# Patient Record
Sex: Female | Born: 1945 | Race: White | Hispanic: No | Marital: Married | Smoking: Never smoker
Health system: Southern US, Community
[De-identification: ages and names within clinical notes are randomized; demographics above are authoritative.]

## PROBLEM LIST (undated history)

## (undated) DIAGNOSIS — E785 Hyperlipidemia, unspecified: Secondary | ICD-10-CM

## (undated) DIAGNOSIS — M1990 Unspecified osteoarthritis, unspecified site: Secondary | ICD-10-CM

## (undated) DIAGNOSIS — R51 Headache: Secondary | ICD-10-CM

## (undated) DIAGNOSIS — I10 Essential (primary) hypertension: Secondary | ICD-10-CM

## (undated) DIAGNOSIS — R519 Headache, unspecified: Secondary | ICD-10-CM

## (undated) HISTORY — PX: NO PAST SURGERIES: SHX2092

## (undated) HISTORY — PX: TUBAL LIGATION: SHX77

---

## 2005-09-03 ENCOUNTER — Ambulatory Visit: Payer: Self-pay | Admitting: Family Medicine

## 2007-02-09 ENCOUNTER — Emergency Department: Payer: Self-pay | Admitting: Emergency Medicine

## 2008-08-09 ENCOUNTER — Ambulatory Visit: Payer: Self-pay | Admitting: Family Medicine

## 2009-08-14 ENCOUNTER — Ambulatory Visit: Payer: Self-pay | Admitting: Family Medicine

## 2012-03-29 ENCOUNTER — Ambulatory Visit: Payer: Self-pay | Admitting: Family Medicine

## 2013-08-01 ENCOUNTER — Ambulatory Visit: Payer: Self-pay | Admitting: Family Medicine

## 2014-12-24 DIAGNOSIS — M810 Age-related osteoporosis without current pathological fracture: Secondary | ICD-10-CM | POA: Insufficient documentation

## 2015-07-03 ENCOUNTER — Other Ambulatory Visit: Payer: Self-pay | Admitting: Family Medicine

## 2015-07-03 DIAGNOSIS — Z1239 Encounter for other screening for malignant neoplasm of breast: Secondary | ICD-10-CM

## 2015-07-10 ENCOUNTER — Ambulatory Visit: Payer: Self-pay

## 2015-07-25 ENCOUNTER — Other Ambulatory Visit: Payer: Self-pay | Admitting: Family Medicine

## 2015-07-25 ENCOUNTER — Ambulatory Visit
Admission: RE | Admit: 2015-07-25 | Discharge: 2015-07-25 | Disposition: A | Discharge status: Home / Self Care | Payer: Medicare PPO | Source: Ambulatory Visit | Attending: Family Medicine | Admitting: Family Medicine

## 2015-07-25 DIAGNOSIS — Z1239 Encounter for other screening for malignant neoplasm of breast: Secondary | ICD-10-CM

## 2015-07-25 DIAGNOSIS — Z1231 Encounter for screening mammogram for malignant neoplasm of breast: Secondary | ICD-10-CM | POA: Diagnosis not present

## 2015-09-25 ENCOUNTER — Encounter: Payer: Self-pay | Admitting: *Deleted

## 2015-09-26 ENCOUNTER — Ambulatory Visit: Payer: Medicare PPO | Admitting: Anesthesiology

## 2015-09-26 ENCOUNTER — Encounter
Admission: RE | Disposition: A | Discharge status: Home / Self Care | Payer: Self-pay | Source: Ambulatory Visit | Attending: Gastroenterology

## 2015-09-26 ENCOUNTER — Encounter: Payer: Self-pay | Admitting: *Deleted

## 2015-09-26 ENCOUNTER — Ambulatory Visit
Admission: RE | Admit: 2015-09-26 | Discharge: 2015-09-26 | Disposition: A | Discharge status: Home / Self Care | Payer: Medicare PPO | Source: Ambulatory Visit | Attending: Gastroenterology | Admitting: Gastroenterology

## 2015-09-26 DIAGNOSIS — I10 Essential (primary) hypertension: Secondary | ICD-10-CM | POA: Diagnosis not present

## 2015-09-26 DIAGNOSIS — Z79899 Other long term (current) drug therapy: Secondary | ICD-10-CM | POA: Insufficient documentation

## 2015-09-26 DIAGNOSIS — Z1211 Encounter for screening for malignant neoplasm of colon: Secondary | ICD-10-CM | POA: Insufficient documentation

## 2015-09-26 DIAGNOSIS — R51 Headache: Secondary | ICD-10-CM | POA: Diagnosis not present

## 2015-09-26 DIAGNOSIS — E785 Hyperlipidemia, unspecified: Secondary | ICD-10-CM | POA: Insufficient documentation

## 2015-09-26 DIAGNOSIS — M1990 Unspecified osteoarthritis, unspecified site: Secondary | ICD-10-CM | POA: Diagnosis not present

## 2015-09-26 DIAGNOSIS — Z7982 Long term (current) use of aspirin: Secondary | ICD-10-CM | POA: Insufficient documentation

## 2015-09-26 HISTORY — DX: Headache, unspecified: R51.9

## 2015-09-26 HISTORY — DX: Unspecified osteoarthritis, unspecified site: M19.90

## 2015-09-26 HISTORY — PX: COLONOSCOPY WITH PROPOFOL: SHX5780

## 2015-09-26 HISTORY — DX: Hyperlipidemia, unspecified: E78.5

## 2015-09-26 HISTORY — DX: Headache: R51

## 2015-09-26 HISTORY — DX: Essential (primary) hypertension: I10

## 2015-09-26 SURGERY — COLONOSCOPY WITH PROPOFOL
Anesthesia: General

## 2015-09-26 MED ORDER — MIDAZOLAM HCL 5 MG/5ML IJ SOLN
INTRAMUSCULAR | Status: DC | PRN
  Administered 2015-09-26: 1 mg via INTRAVENOUS

## 2015-09-26 MED ORDER — SODIUM CHLORIDE 0.9 % IV SOLN: INTRAVENOUS | Status: DC

## 2015-09-26 MED ORDER — PROPOFOL 500 MG/50ML IV EMUL
INTRAVENOUS | Status: DC | PRN
  Administered 2015-09-26: 100 ug/kg/min via INTRAVENOUS

## 2015-09-26 MED ORDER — PHENYLEPHRINE HCL 10 MG/ML IJ SOLN
INTRAMUSCULAR | Status: DC | PRN
  Administered 2015-09-26: 150 ug via INTRAVENOUS

## 2015-09-26 MED ORDER — EPHEDRINE SULFATE 50 MG/ML IJ SOLN
INTRAMUSCULAR | Status: DC | PRN
  Administered 2015-09-26: 7.5 mg via INTRAVENOUS

## 2015-09-26 MED ORDER — FENTANYL CITRATE (PF) 100 MCG/2ML IJ SOLN
INTRAMUSCULAR | Status: DC | PRN
  Administered 2015-09-26: 50 ug via INTRAVENOUS

## 2015-09-26 MED ORDER — FENTANYL CITRATE (PF) 100 MCG/2ML IJ SOLN: 25.0000 ug | INTRAMUSCULAR | Status: DC | PRN

## 2015-09-26 MED ORDER — LIDOCAINE HCL (CARDIAC) 20 MG/ML IV SOLN
INTRAVENOUS | Status: DC | PRN
  Administered 2015-09-26: 60 mg via INTRAVENOUS

## 2015-09-26 MED ORDER — PROPOFOL 10 MG/ML IV BOLUS
INTRAVENOUS | Status: DC | PRN
  Administered 2015-09-26: 30 mg via INTRAVENOUS

## 2015-09-26 MED ORDER — SODIUM CHLORIDE 0.9 % IV SOLN
INTRAVENOUS | Status: DC
  Administered 2015-09-26: 1000 mL via INTRAVENOUS

## 2015-09-26 MED ORDER — ONDANSETRON HCL 4 MG/2ML IJ SOLN: 4.0000 mg | Freq: Once | INTRAMUSCULAR | Status: DC | PRN

## 2015-09-26 NOTE — Transfer of Care (Signed)
Immediate Anesthesia Transfer of Care Note  Patient: Victoria [name]  Procedure(s) Performed: Procedure(s): COLONOSCOPY WITH PROPOFOL (N/A)  Patient Location: PACU  Anesthesia Type:General  Level of Consciousness: awake, alert , oriented and patient cooperative  Airway & Oxygen Therapy: Patient Spontanous Breathing and Patient connected to nasal cannula oxygen  Post-op Assessment: Report given to RN and Post -op Vital signs reviewed and stable  Post vital signs: Reviewed and stable  Last Vitals:  Filed Vitals:   09/26/15 0911  BP: 126/70  Pulse: 71  Temp: 36 C  Resp: 13    Complications: No apparent anesthesia complications

## 2015-09-26 NOTE — Anesthesia Postprocedure Evaluation (Signed)
  Anesthesia Post-op Note  Patient: Victoria [name]  Procedure(s) Performed: Procedure(s): COLONOSCOPY WITH PROPOFOL (N/A)  Anesthesia type:General  Patient location: PACU  Post pain: Pain level controlled  Post assessment: Post-op Vital signs reviewed, Patient's Cardiovascular Status Stable, Respiratory Function Stable, Patent Airway and No signs of Nausea or vomiting  Post vital signs: Reviewed and stable  Last Vitals:  Filed Vitals:   09/26/15 0933  BP: 107/70  Pulse: 64  Temp:   Resp: 17    Level of consciousness: awake, alert  and patient cooperative  Complications: No apparent anesthesia complications

## 2015-09-26 NOTE — Anesthesia Preprocedure Evaluation (Signed)
Anesthesia Evaluation  Patient identified by MRN, date of birth, ID band Patient awake    Reviewed: Allergy & Precautions, NPO status , Patient's Chart, lab work & pertinent test results  Airway Mallampati: II  TM Distance: >3 FB Neck ROM: Full    Dental  (+) Partial Upper   Pulmonary neg pulmonary ROS,    Pulmonary exam normal breath sounds clear to auscultation       Cardiovascular hypertension, Pt. on medications Normal cardiovascular exam     Neuro/Psych  Headaches, negative psych ROS   GI/Hepatic negative GI ROS, Neg liver ROS,   Endo/Other  negative endocrine ROS  Renal/GU negative Renal ROS  negative genitourinary   Musculoskeletal  (+) Arthritis , Osteoarthritis,    Abdominal Normal abdominal exam  (+)   Peds negative pediatric ROS (+)  Hematology negative hematology ROS (+)   Anesthesia Other Findings   Reproductive/Obstetrics                             Anesthesia Physical Anesthesia Plan  ASA: II  Anesthesia Plan: General   Post-op Pain Management:    Induction: Intravenous  Airway Management Planned: Nasal Cannula  Additional Equipment:   Intra-op Plan:   Post-operative Plan:   Informed Consent: I have reviewed the patients History and Physical, chart, labs and discussed the procedure including the risks, benefits and alternatives for the proposed anesthesia with the patient or authorized representative who has indicated his/her understanding and acceptance.   Dental advisory given  Plan Discussed with: CRNA and Surgeon  Anesthesia Plan Comments:         Anesthesia Quick Evaluation

## 2015-09-26 NOTE — Op Note (Signed)
[facility] Gastroenterology Patient Name: Victoria [name] Procedure Date: 09/26/2015 8:31 AM MRN: [id number] Account #: [id number] Date of Birth: 1946-04-11 Admit Type: Outpatient Age: 69 Room: [facility] ENDO ROOM 4 Gender: Female Note Status: Finalized Procedure:         Colonoscopy Indications:       Screening for colorectal malignant neoplasm Providers:         Ezzard Standing. Bluford Kaufmann, MD Referring MD:      Teena Irani. Terance Hart, MD (Referring MD) Medicines:         Monitored Anesthesia Care Complications:     No immediate complications. Procedure:         Pre-Anesthesia Assessment:                    - Prior to the procedure, a History and Physical was                     performed, and patient medications, allergies and                     sensitivities were reviewed. The patient's tolerance of                     previous anesthesia was reviewed.                    - The risks and benefits of the procedure and the sedation                     options and risks were discussed with the patient. All                     questions were answered and informed consent was obtained.                    - After reviewing the risks and benefits, the patient was                     deemed in satisfactory condition to undergo the procedure.                    After obtaining informed consent, the colonoscope was                     passed under direct vision. Throughout the procedure, the                     patient's blood pressure, pulse, and oxygen saturations                     were monitored continuously. The Olympus CF-Q160AL                     colonoscope (S#. [id number]) was introduced through the anus                     and advanced to the the cecum, identified by appendiceal                     orifice and ileocecal valve. The colonoscopy was performed                     without difficulty. The patient tolerated the procedure  well. The quality of the bowel  preparation was fair. Findings:      The colon (entire examined portion) appeared normal. Impression:        - The entire examined colon is normal.                    - No specimens collected. Recommendation:    - Discharge patient to home.                    - The findings and recommendations were discussed with the                     patient. Procedure Code(s): --- Professional ---                    [phone] [id number], Colonoscopy, flexible; diagnostic, including                     collection of specimen(s) by brushing or washing, when                     performed (separate procedure) Diagnosis Code(s): --- Professional ---                    Z12.11, Encounter for screening for malignant neoplasm of                     colon CPT copyright 2014 American Medical Association. All rights reserved. The codes documented in this report are preliminary and upon coder review may  be revised to meet current compliance requirements. Wallace CullensPaul Y Dajuan Turnley, MD 09/26/2015 8:59:17 AM This report has been signed electronically. Number of Addenda: 0 Note Initiated On: 09/26/2015 8:31 AM Scope Withdrawal Time: 0 hours 7 minutes 28 seconds  Total Procedure Duration: 0 hours 16 minutes 53 seconds       [facility]

## 2015-09-26 NOTE — H&P (Signed)
    Primary Care Physician:  Dorothey BasemanAVID BRONSTEIN, MD Primary Gastroenterologist:  Dr. Bluford Kaufmannh  Pre-Procedure History & Physical: HPI:  Victoria LikesJudy L Benkert is a 69 y.o. female is here for an colonoscopy.   Past Medical History  Diagnosis Date  . Hypertension   . Hyperlipidemia   . Arthritis   . Headache     Past Surgical History  Procedure Laterality Date  . No past surgeries    . Tubal ligation      Prior to Admission medications   Medication Sig Start Date End Date Taking? Authorizing Provider  aspirin 325 MG tablet Take 325 mg by mouth daily.   Yes Historical Provider, MD  b complex vitamins capsule Take 1 capsule by mouth daily.   Yes Historical Provider, MD  valsartan-hydrochlorothiazide (DIOVAN-HCT) 320-25 MG tablet Take 1 tablet by mouth daily.   Yes Historical Provider, MD    Allergies as of 07/30/2015  . (Not on File)    History reviewed. No pertinent family history.  Social History   Social History  . Marital Status: Married    Spouse Name: N/A  . Number of Children: N/A  . Years of Education: N/A   Occupational History  . Not on file.   Social History Main Topics  . Smoking status: Never Smoker   . Smokeless tobacco: Not on file  . Alcohol Use: No  . Drug Use: No  . Sexual Activity: Not on file   Other Topics Concern  . Not on file   Social History Narrative    Review of Systems: See HPI, otherwise negative ROS  Physical Exam: There were no vitals taken for this visit. General:   Alert,  pleasant and cooperative in NAD Head:  Normocephalic and atraumatic. Neck:  Supple; no masses or thyromegaly. Lungs:  Clear throughout to auscultation.    Heart:  Regular rate and rhythm. Abdomen:  Soft, nontender and nondistended. Normal bowel sounds, without guarding, and without rebound.   Neurologic:  Alert and  oriented x4;  grossly normal neurologically.  Impression/Plan: Victoria [name] is here for an colonoscopy to be performed for screening. Risks,  benefits, limitations, and alternatives regarding  colonoscopy have been reviewed with the patient.  Questions have been answered.  All parties agreeable.   Joann Kulpa, Ezzard StandingPAUL Y, MD  09/26/2015, 7:59 AM

## 2015-09-30 ENCOUNTER — Encounter: Payer: Self-pay | Admitting: Gastroenterology

## 2017-07-07 ENCOUNTER — Other Ambulatory Visit: Payer: Self-pay | Admitting: Family Medicine

## 2017-07-07 DIAGNOSIS — Z1239 Encounter for other screening for malignant neoplasm of breast: Secondary | ICD-10-CM

## 2017-07-28 ENCOUNTER — Ambulatory Visit
Admission: RE | Admit: 2017-07-28 | Discharge: 2017-07-28 | Disposition: A | Discharge status: Home / Self Care | Payer: Medicare Other | Source: Ambulatory Visit | Attending: Family Medicine | Admitting: Family Medicine

## 2017-07-28 DIAGNOSIS — Z1231 Encounter for screening mammogram for malignant neoplasm of breast: Secondary | ICD-10-CM | POA: Insufficient documentation

## 2017-07-28 DIAGNOSIS — Z1239 Encounter for other screening for malignant neoplasm of breast: Secondary | ICD-10-CM

## 2019-01-09 ENCOUNTER — Encounter: Payer: Self-pay | Admitting: *Deleted

## 2019-01-09 ENCOUNTER — Other Ambulatory Visit: Payer: Self-pay

## 2019-01-09 ENCOUNTER — Emergency Department
Admission: EM | Admit: 2019-01-09 | Discharge: 2019-01-09 | Disposition: A | Discharge status: Home / Self Care | Payer: Medicare Other | Attending: Emergency Medicine | Admitting: Emergency Medicine

## 2019-01-09 ENCOUNTER — Emergency Department: Payer: Medicare Other

## 2019-01-09 DIAGNOSIS — Y939 Activity, unspecified: Secondary | ICD-10-CM | POA: Diagnosis not present

## 2019-01-09 DIAGNOSIS — Y929 Unspecified place or not applicable: Secondary | ICD-10-CM | POA: Insufficient documentation

## 2019-01-09 DIAGNOSIS — W231XXA Caught, crushed, jammed, or pinched between stationary objects, initial encounter: Secondary | ICD-10-CM | POA: Insufficient documentation

## 2019-01-09 DIAGNOSIS — I10 Essential (primary) hypertension: Secondary | ICD-10-CM | POA: Insufficient documentation

## 2019-01-09 DIAGNOSIS — Y999 Unspecified external cause status: Secondary | ICD-10-CM | POA: Diagnosis not present

## 2019-01-09 DIAGNOSIS — Z79899 Other long term (current) drug therapy: Secondary | ICD-10-CM | POA: Diagnosis not present

## 2019-01-09 DIAGNOSIS — S61317A Laceration without foreign body of left little finger with damage to nail, initial encounter: Secondary | ICD-10-CM | POA: Insufficient documentation

## 2019-01-09 DIAGNOSIS — Z7982 Long term (current) use of aspirin: Secondary | ICD-10-CM | POA: Insufficient documentation

## 2019-01-09 DIAGNOSIS — S6992XA Unspecified injury of left wrist, hand and finger(s), initial encounter: Secondary | ICD-10-CM | POA: Diagnosis present

## 2019-01-09 MED ORDER — CEPHALEXIN 500 MG PO CAPS: 500.0000 mg | ORAL_CAPSULE | Freq: Three times a day (TID) | ORAL | 0 refills | Status: AC

## 2019-01-09 MED ORDER — SULFAMETHOXAZOLE-TRIMETHOPRIM 800-160 MG PO TABS: 1.0000 | ORAL_TABLET | Freq: Two times a day (BID) | ORAL | 0 refills | Status: AC

## 2019-01-09 NOTE — ED Provider Notes (Signed)
[facility] Emergency Department Provider Note  ____________________________________________  Time seen: Approximately 9:13 PM  I have reviewed the triage vital signs and the nursing notes.   HISTORY  Chief Complaint Laceration    HPI Victoria [name] is a 73 y.o. female presents to the emergency department with an avulsion type laceration at the dorsal aspect of the left fifth digit from DIP joint to volar aspect of left fifth fingertip.  Patient reported that she had a piece of skin that was hanging from laceration that she trimmed away along with fingernail.  Patient reports that fingernail was dangling from laceration.  Injury was sustained while splitting wood at approximately 6:00 pm tonight.  Patient reports that she caught her finger between 2 pieces of wood.  No numbness or tingling in the left hand.  Patient reports that her tetanus status is up-to-date. No other alleviating measures have been attempted.    Past Medical History:  Diagnosis Date  . Arthritis   . Headache   . Hyperlipidemia   . Hypertension     There are no active problems to display for this patient.   Past Surgical History:  Procedure Laterality Date  . COLONOSCOPY WITH PROPOFOL N/A 09/26/2015   Procedure: COLONOSCOPY WITH PROPOFOL;  Surgeon: Wallace Cullens, MD;  Location: [facility] ENDOSCOPY;  Service: Gastroenterology;  Laterality: N/A;  . NO PAST SURGERIES    . TUBAL LIGATION      Prior to Admission medications   Medication Sig Start Date End Date Taking? Authorizing Provider  aspirin 325 MG tablet Take 325 mg by mouth daily.    [provider]  b complex vitamins capsule Take 1 capsule by mouth daily.    [provider]  cephALEXin (KEFLEX) 500 MG capsule Take 1 capsule (500 mg total) by mouth 3 (three) times daily for 7 days. 01/09/19 01/16/19  Orvil Feil, PA-C  sulfamethoxazole-trimethoprim (BACTRIM DS,SEPTRA DS) 800-160 MG tablet Take 1 tablet by mouth 2 (two)  times daily for 7 days. 01/09/19 01/16/19  Orvil Feil, PA-C  valsartan-hydrochlorothiazide (DIOVAN-HCT) 320-25 MG tablet Take 1 tablet by mouth daily.    [provider]    Allergies Patient has no known allergies.  History reviewed. No pertinent family history.  Social History Social History   Tobacco Use  . Smoking status: Never Smoker  . Smokeless tobacco: Never Used  Substance Use Topics  . Alcohol use: No  . Drug use: No     Review of Systems  Constitutional: No fever/chills Eyes: No visual changes. No discharge ENT: No upper respiratory complaints. Cardiovascular: no chest pain. Respiratory: no cough. No SOB. Gastrointestinal: No abdominal pain.  No nausea, no vomiting.  No diarrhea.  No constipation. Genitourinary: Negative for dysuria. No hematuria Musculoskeletal: Negative for musculoskeletal pain. Skin: Patient has avulsion type laceration of left fifth digit.  Neurological: Negative for headaches, focal weakness or numbness.   ____________________________________________   PHYSICAL EXAM:  VITAL SIGNS: ED Triage Vitals  Enc Vitals Group     BP 01/09/19 2021 (!) 145/88     Pulse Rate 01/09/19 2021 83     Resp 01/09/19 2021 18     Temp 01/09/19 2021 98 F (36.7 C)     Temp Source 01/09/19 2021 Oral     SpO2 01/09/19 2021 99 %     Weight --      Height --      Head Circumference --      Peak Flow --  Pain Score 01/09/19 2022 2     Pain Loc --      Pain Edu? --      Excl. in GC? --      Constitutional: Alert and oriented. Well appearing and in no acute distress. Eyes: Conjunctivae are normal. PERRL. EOMI. Head: Atraumatic. Cardiovascular: Normal rate, regular rhythm. Normal S1 and S2.  Good peripheral circulation. Respiratory: Normal respiratory effort without tachypnea or retractions. Lungs CTAB. Good air entry to the bases with no decreased or absent breath sounds. Musculoskeletal: Full range of motion to all extremities. No  gross deformities appreciated.  No flexor or extensor tendon deficits to the left fifth digit. Neurologic:  Normal speech and language. No gross focal neurologic deficits are appreciated.  Skin: Patient has avulsion type laceration from the dorsal aspect of the left fifth digit from the DIP joint to the volar aspect of the left fifth fingertip.  Fingernail was displaced.  Psychiatric: Mood and affect are normal. Speech and behavior are normal. Patient exhibits appropriate insight and judgement.   ____________________________________________   LABS (all labs ordered are listed, but only abnormal results are displayed)  Labs Reviewed - No data to display ____________________________________________  EKG   ____________________________________________  RADIOLOGY I personally viewed and evaluated these images as part of my medical decision making, as well as reviewing the written report by the radiologist.  Dg Finger Little Right  Result Date: 01/09/2019 CLINICAL DATA:  Laceration, avulsion at the tip of the right little finger EXAM: RIGHT LITTLE FINGER 2+V COMPARISON:  None. FINDINGS: Soft tissue defect noted at the tip of the right little finger. No underlying acute bony abnormality. No fracture, subluxation or dislocation. Joint space narrowing in the DIP joint. IMPRESSION: No acute bony abnormality. Electronically Signed   By: Charlett Nose M.D.   On: 01/09/2019 20:53    ____________________________________________    PROCEDURES  Procedure(s) performed:    Procedures  Left third digit laceration was irrigated with 500 cc of normal saline and Betadine.  Surgicel was applied over wound with overlying Xeroform gauze.  A layer of 4 x 4's was wrapped with Coban for compression.  Patient underwent serial bleeding checks of 15 minutes.  Medications - No data to display   ____________________________________________   INITIAL IMPRESSION / ASSESSMENT AND PLAN / ED  COURSE  Pertinent labs & imaging results that were available during my care of the patient were reviewed by me and considered in my medical decision making (see chart for details).  Review of the [place] CSRS was performed in accordance of the [facility] prior to dispensing any controlled drugs.      Assessment and Plan:  Left fifth digit laceration:  Patient presents to the emergency department with an avulsion type laceration of the left fifth digit.  Laceration was conducive to repair due to lack of viable skin.  Wound was irrigated in the emergency department with Betadine and normal saline and hemostasis was achieved using Surgicel.  Patient was observed in the emergency department for a hour to ensure that wound stopped bleeding.  Patient education regarding wound care was given.  Patient was advised to follow-up with both Dr. Stephenie Acres and wound care.  She was treated with Bactrim and Keflex. Catha Gosselin, NP was asked to examine injury and she agrees with plan of care.  Return precautions were given to return with new or worsening symptoms.  All patient questions were answered.   ____________________________________________  FINAL CLINICAL IMPRESSION(S) / ED DIAGNOSES  Final  diagnoses:  Laceration of left little finger without foreign body with damage to nail, initial encounter      NEW MEDICATIONS STARTED DURING THIS VISIT:  ED Discharge Orders         Ordered    cephALEXin (KEFLEX) 500 MG capsule  3 times daily     01/09/19 2251    sulfamethoxazole-trimethoprim (BACTRIM DS,SEPTRA DS) 800-160 MG tablet  2 times daily     01/09/19 2251              This chart was dictated using voice recognition software/Dragon. Despite best efforts to proofread, errors can occur which can change the meaning. Any change was purely unintentional.    Orvil Feil, PA-C 01/09/19 2328    Minna Antis, MD 01/10/19 0005

## 2019-01-09 NOTE — ED Triage Notes (Addendum)
Pt present w/ avulsion to R 5th digit. Pt presents w/ compression dressing from urgent care. Pt states the bone is exposed. Bleeding controlled at this time. Pt seen today at urgent care and was sent here. Pt had degloving incident at home where her finger was caught between two pieces of woods.

## 2019-01-11 ENCOUNTER — Encounter: Payer: Medicare Other | Attending: Internal Medicine | Admitting: Internal Medicine

## 2019-01-11 DIAGNOSIS — E785 Hyperlipidemia, unspecified: Secondary | ICD-10-CM | POA: Insufficient documentation

## 2019-01-11 DIAGNOSIS — S61312D Laceration without foreign body of right middle finger with damage to nail, subsequent encounter: Secondary | ICD-10-CM | POA: Insufficient documentation

## 2019-01-11 DIAGNOSIS — L98498 Non-pressure chronic ulcer of skin of other sites with other specified severity: Secondary | ICD-10-CM | POA: Diagnosis not present

## 2019-01-11 DIAGNOSIS — W3189XD Contact with other specified machinery, subsequent encounter: Secondary | ICD-10-CM | POA: Diagnosis not present

## 2019-01-11 DIAGNOSIS — I110 Hypertensive heart disease with heart failure: Secondary | ICD-10-CM | POA: Insufficient documentation

## 2019-01-11 DIAGNOSIS — S61411D Laceration without foreign body of right hand, subsequent encounter: Secondary | ICD-10-CM | POA: Diagnosis present

## 2019-01-13 NOTE — Progress Notes (Signed)
Barga, Lillyen L. ([id number]) Visit Report for 01/11/2019 Abuse/Suicide Risk Screen Details Patient Name: Hatler, Evaleigh L. Date of Service: 01/11/2019 8:45 AM Medical Record Number: [id number] Patient Account Number: [id number] Date of Birth/Sex: 10/20/1946 (73 y.o. F) Treating RN: Arnette Norris Primary Care Abby Tucholski: Dorothey Baseman Other Clinician: Referring Cheveyo [place]: Terance Hart, DAVID Treating Anuradha Chabot/Extender: Altamese [place] in Treatment: 0 Abuse/Suicide Risk Screen Items Answer ABUSE/SUICIDE RISK SCREEN: Has anyone close to you tried to hurt or harm you recentlyo No Do you feel uncomfortable with anyone in your familyo No Has anyone forced you do things that you didnot want to doo No Do you have any thoughts of harming yourselfo No Patient displays signs or symptoms of abuse and/or neglect. No Electronic Signature(s) Signed: 01/12/2019 7:54:32 AM By: Arnette Norris Entered By: Arnette Norris on 01/11/2019 09:13:33 Worrall, Keenya L. ([id number]) -------------------------------------------------------------------------------- Activities of Daily Living Details Patient Name: Frakes, Eusebia L. Date of Service: 01/11/2019 8:45 AM Medical Record Number: [id number] Patient Account Number: [id number] Date of Birth/Sex: 1946/09/20 (73 y.o. F) Treating RN: Arnette Norris Primary Care Roslin Norwood: Dorothey Baseman Other Clinician: Referring Keyon Liller: Terance Hart, DAVID Treating Zaheer Wageman/Extender: Altamese [place] in Treatment: 0 Activities of Daily Living Items Answer Activities of Daily Living (Please select one for each item) Drive Automobile Completely Able Take Medications Completely Able Use Telephone Completely Able Care for Appearance Completely Able Use Toilet Completely Able Bath / Shower Completely Able Dress Self Completely Able Feed Self Completely Able Walk Completely Able Get In / Out Bed Completely Able Housework Completely Able Prepare Meals Completely  Able Handle Money Completely Able Shop for Self Completely Able Electronic Signature(s) Signed: 01/12/2019 7:54:32 AM By: Arnette Norris Entered By: Arnette Norris on 01/11/2019 09:13:44 Rodak, Quentina L. ([id number]) -------------------------------------------------------------------------------- Education Assessment Details Patient Name: Ralls, Kaly L. Date of Service: 01/11/2019 8:45 AM Medical Record Number: [id number] Patient Account Number: [id number] Date of Birth/Sex: 1946-05-08 (73 y.o. F) Treating RN: Arnette Norris Primary Care Elner Seifert: Dorothey Baseman Other Clinician: Referring Jessenya Berdan: Terance Hart DAVID Treating Selia Wareing/Extender: Altamese  in Treatment: 0 Primary Learner Assessed: Patient Learning Preferences/Education Level/Primary Language Learning Preference: Explanation Highest Education Level: College or Above Preferred Language: English Cognitive Barrier Assessment/Beliefs Language Barrier: No Translator Needed: No Memory Deficit: No Emotional Barrier: No Cultural/Religious Beliefs Affecting Medical Care: No Physical Barrier Assessment Impaired Vision: No Impaired Hearing: No Decreased Hand dexterity: No Knowledge/Comprehension Assessment Knowledge Level: High Comprehension Level: High Ability to understand written High instructions: Ability to understand verbal High instructions: Motivation Assessment Anxiety Level: Calm Cooperation: Cooperative Education Importance: Acknowledges Need Interest in Health Problems: Asks Questions Perception: Coherent Willingness to Engage in Self- High Management Activities: Readiness to Engage in Self- High Management Activities: Electronic Signature(s) Signed: 01/12/2019 7:54:32 AM By: Arnette Norris Entered By: Arnette Norris on 01/11/2019 09:14:07 Loflin, Myeisha L. ([id number]) -------------------------------------------------------------------------------- Fall Risk Assessment  Details Patient Name: Boughner, Eadie L. Date of Service: 01/11/2019 8:45 AM Medical Record Number: [id number] Patient Account Number: [id number] Date of Birth/Sex: [birth year] (73 y.o. F) Treating RN: Arnette Norris Primary Care Carey Johndrow: Dorothey Baseman Other Clinician: Referring Hydeia Mcatee: Terance Hart, DAVID Treating Jayln Madeira/Extender: Altamese  in Treatment: 0 Fall Risk Assessment Items Have you had 2 or more falls in the last 12 monthso 0 No Have you had any fall that resulted in injury in the last 12 monthso 0 No FALL RISK ASSESSMENT: History of falling - immediate or within 3 months 0 No Secondary diagnosis 0 No Ambulatory aid None/bed rest/wheelchair/nurse 0 Yes Crutches/cane/walker 0 No Furniture 0  No IV Access/Saline Lock 0 No Gait/Training Normal/bed rest/immobile 0 Yes Weak 0 No Impaired 0 No Mental Status Oriented to own ability 0 Yes Electronic Signature(s) Signed: 01/12/2019 7:54:32 AM By: Arnette Norris Entered By: Arnette Norris on 01/11/2019 09:14:27 Losurdo, Boni L. ([id number]) -------------------------------------------------------------------------------- Foot Assessment Details Patient Name: Capuano, [place] L. Date of Service: 01/11/2019 8:45 AM Medical Record Number: [id number] Patient Account Number: [id number] Date of Birth/Sex: 1945-12-03 (73 y.o. F) Treating RN: Arnette Norris Primary Care Zoi Devine: Dorothey Baseman Other Clinician: Referring Kemaya Dorner: Terance Hart, DAVID Treating Laranda Burkemper/Extender: Altamese [place] in Treatment: 0 Foot Assessment Items Site Locations + = Sensation present, - = Sensation absent, C = Callus, U = Ulcer R = Redness, W = Warmth, M = Maceration, PU = Pre-ulcerative lesion F = Fissure, S = Swelling, D = Dryness Assessment Right: Left: Other Deformity: No No Prior Foot Ulcer: No No Prior Amputation: No No Charcot Joint: No No Ambulatory Status: Gait: Electronic Signature(s) Signed: 01/12/2019 7:54:32  AM By: Arnette Norris Entered By: Arnette Norris on 01/11/2019 09:15:16 Abbey, Viney L. ([id number]) -------------------------------------------------------------------------------- Nutrition Risk Assessment Details Patient Name: Horine, Pressley L. Date of Service: 01/11/2019 8:45 AM Medical Record Number: [id number] Patient Account Number: [id number] Date of Birth/Sex: 1945-11-20 (73 y.o. F) Treating RN: Arnette Norris Primary Care Berdena Cisek: Dorothey Baseman Other Clinician: Referring Royer Cristobal: Terance Hart, DAVID Treating Charish Schroepfer/Extender: Altamese [place] in Treatment: 0 Height (in): 58 Weight (lbs): 113 Body Mass Index (BMI): 23.6 Nutrition Risk Assessment Items NUTRITION RISK SCREEN: I have an illness or condition that made me change the kind and/or amount of 0 No food I eat I eat fewer than two meals per day 0 No I eat few fruits and vegetables, or milk products 0 No I have three or more drinks of beer, liquor or wine almost every day 0 No I have tooth or mouth problems that make it hard for me to eat 0 No I don't always have enough money to buy the food I need 0 No I eat alone most of the time 0 No I take three or more different prescribed or over-the-counter drugs a day 0 No Without wanting to, I have lost or gained 10 pounds in the last six months 0 No I am not always physically able to shop, cook and/or feed myself 0 No Nutrition Protocols Good Risk Protocol Moderate Risk Protocol Electronic Signature(s) Signed: 01/12/2019 7:54:32 AM By: Arnette Norris Entered By: Arnette Norris on 01/11/2019 09:14:58

## 2019-01-13 NOTE — Progress Notes (Signed)
Westling, Neha L. ([id number]) Visit Report for 01/11/2019 HPI Details Patient Name: Victoria [name], [name] Date of Service: 01/11/2019 8:45 AM Medical Record Number: [id number] Patient Account Number: [id number] Date of Birth/Sex: 1946-06-14 (73 y.o. 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 Her hand was gloved. She had an avulsion injury from the mid right middle phalanx all the way up and including her nailbed to the tip of her finger. There was tissue hanging off she actually remove this. She presented to the ER on 01/09/2019 after this happened. An x-ray was negative. There was no tissue for suturing. She had Bactrim and Keflex provided. The wound was dressed with surgie gel, Xeroform and Coban. She presents here for follow-up. Past medical history includes hyperlipidemia and hypertension Electronic Signature(s) Signed: 01/11/2019 4:42:30 PM By: Baltazar Najjar MD Entered By: Baltazar Najjar on 01/11/2019 10:08:59 Bonsell, Meredith L. ([id number]) -------------------------------------------------------------------------------- Physical Exam Details Patient Name: [name], [name] Date of Service: 01/11/2019 8:45 AM Medical Record Number: [id number] Patient Account Number: [id number] Date of Birth/Sex: [birth year] (73 y.o. F) Treating RN: Huel Coventry Primary Care Provider: Dorothey Baseman Other Clinician: Referring Provider: Terance Hart, DAVID Treating Provider/Extender: Maxwell Caul Weeks in Treatment: 0 Constitutional Sitting or standing Blood Pressure is within target range for patient.. Pulse regular and within target range  for patient.Marland Kitchen Respirations regular, non-labored and within target range.. Temperature is normal and within the target range for the patient.Marland Kitchen appears in no distress. Eyes Conjunctivae clear. No discharge. Neck Neck supple and symmetrical. No masses or crepitus. Respiratory Respiratory effort is easy and symmetric bilaterally. Rate is normal at rest and on room air.. Musculoskeletal She has good range of motion at the DIP of the right fifth finger. Not full because of the swelling in the wound but certainly has good tendon function. I see no involvement of the PIP joint itself. Neurological The patient has normal sensation in the right fifth finger including the tip of the finger.Marland Kitchen Psychiatric No evidence of depression, anxiety, or agitation. Calm, cooperative, and communicative. Appropriate interactions and affect.. Notes Wound exam; the patient has a superficial wound from the mid part of the middle phalanx all the way to the tip of the right fifth finger. This remove the nail bed. Most of the rest of this is superficial there is no bone or other exposed structures. No evidence of infection. Electronic Signature(s) Signed: 01/11/2019 4:42:30 PM By: Baltazar Najjar MD Entered By: Baltazar Najjar on 01/11/2019 10:11:15 Sylve, Kahlyn L. ([id number]) -------------------------------------------------------------------------------- Physician Orders Details Patient Name: [name], [name] Date of Service: 01/11/2019 8:45 AM Medical Record Number: [id number] Patient Account Number: [id number] Date of Birth/Sex: [birth year] (73 y.o. F) Treating RN: Huel Coventry Primary Care Provider: Dorothey Baseman Other Clinician: Referring Provider: Terance Hart, DAVID Treating Provider/Extender: Altamese  in Treatment: 0 Verbal / Phone Orders: No Diagnosis Coding Wound Cleansing Wound #1 Right Hand - 5th Digit o Clean wound with Normal Saline. o Cleanse wound with mild soap and  water Anesthetic (add to Medication List) Wound #1 Right Hand - 5th Digit o Topical Lidocaine 4% cream applied to wound bed prior to debridement (In Clinic Only). Primary Wound Dressing Wound #1 Right Hand - 5th Digit   o Other: - triple antibiotic Secondary Dressing Wound #1 Right Hand - 5th Digit o [place] o Conform/Kerlix Dressing Change Frequency Wound #1 Right Hand - 5th Digit o Change dressing every day. Follow-up Appointments Wound #1 Right Hand - 5th Digit o Return Appointment in 1 week. Electronic Signature(s) Signed: 01/11/2019 4:42:30 PM By: Baltazar Najjar MD Signed: 01/11/2019 5:55:39 PM By: Elliot Gurney, BSN, RN, CWS, Kim RN, BSN Entered By: Elliot Gurney, BSN, RN, CWS, Kim on 01/11/2019 09:29:13 Mcginness, Nianna L. ([id number]) -------------------------------------------------------------------------------- Problem List Details Patient Name: [name], [name] Date of Service: 01/11/2019 8:45 AM Medical Record Number: [id number] Patient Account Number: [id number] Date of Birth/Sex: [birth year] (73 y.o. 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 ([id number]) -------------------------------------------------------------------------------- Progress Note Details Patient Name: [name], [name] Date of Service: 01/11/2019 8:45 AM Medical Record Number:  [id number] Patient Account Number: [id number] Date of Birth/Sex: August 21, 1946 (73 y.o. 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 Her hand was gloved. She had an avulsion injury from the mid right middle phalanx all the way up and including her nailbed to the tip of her finger. There was tissue hanging off she actually remove this. She presented to the ER on 01/09/2019 after this happened. An x-ray was negative. There was no tissue for suturing. She had Bactrim and Keflex provided. The wound was dressed with surgie gel, Xeroform and Coban. She presents here for follow-up. Past medical history includes hyperlipidemia and hypertension Wound History Patient presents with 1 open wound that has been present for approximately 2 days. Patient has been treating wound in the following manner: pressure dressing, gauze, xeroform. Laboratory tests have not been performed in the last month. Patient reportedly has not tested positive for an antibiotic resistant organism. Patient reportedly has not tested positive for osteomyelitis. Patient reportedly has not had testing performed to evaluate circulation in the legs. Patient History Information obtained from Patient. Allergies No Known Drug Allergies Family History Cancer - Mother, Heart Disease - Father,Siblings, Hypertension - Siblings, Stroke - Father, No family history of Diabetes, Kidney Disease, Lung Disease, Seizures, Thyroid Problems, Tuberculosis. Social History Never smoker, Marital Status - Married, Alcohol Use - Never, Drug Use - No History, Caffeine Use - Daily -  coffee. Medical History Eyes Denies history of Cataracts, Glaucoma, Optic Neuritis Ear/Nose/Mouth/Throat Denies history of Chronic sinus problems/congestion, Middle ear problems Hematologic/Lymphatic Denies history of Anemia, Hemophilia, Human Immunodeficiency Virus, Lymphedema, Sickle Cell Disease Respiratory Denies history of Aspiration, Asthma, Chronic Obstructive Pulmonary Disease (COPD), Pneumothorax, Sleep Apnea, Tuberculosis Cardiovascular Patient has history of Hypertension Denies history of Angina, Arrhythmia, Congestive Heart Failure, Coronary Artery Disease, Deep Vein Thrombosis, Hypotension, Myocardial Infarction, Peripheral Arterial Disease, Peripheral Venous Disease, Phlebitis, Vasculitis Nhan, Kory L. ([id number]) Gastrointestinal  Denies history of Cirrhosis , Colitis, Crohn s, Hepatitis A, Hepatitis B, Hepatitis C Endocrine Denies history of Type I Diabetes, Type II Diabetes Genitourinary Denies history of End Stage Renal Disease Immunological Denies history of Lupus Erythematosus, Raynaud s, Scleroderma Integumentary (Skin) Denies history of History of Burn, History of pressure wounds Musculoskeletal Denies history of Gout, Rheumatoid Arthritis, Osteoarthritis, Osteomyelitis Neurologic Denies history of Dementia, Neuropathy, Quadriplegia, Paraplegia, Seizure Disorder Oncologic Denies history of Received Chemotherapy, Received Radiation Psychiatric Denies history of Anorexia/bulimia, Confinement Anxiety Medical And Surgical History Notes Musculoskeletal Arthritis Review of Systems (ROS) Constitutional Symptoms (General Health) The patient has no complaints or symptoms. Eyes Complains or has symptoms of Glasses / Contacts - distance. Ear/Nose/Mouth/Throat The patient has no complaints or symptoms. Hematologic/Lymphatic The patient has no complaints or symptoms. Respiratory The patient has no complaints or symptoms. Cardiovascular Denies complaints or  symptoms of Chest pain, LE edema, Hyperlipidemia Gastrointestinal The patient has no complaints or symptoms. Endocrine The patient has no complaints or symptoms. Genitourinary The patient has no complaints or symptoms. Immunological The patient has no complaints or symptoms. 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 Denies complaints or symptoms of Bleeding or bruising tendency, Breakdown, Swelling. Musculoskeletal The patient has no complaints or symptoms. Neurologic The patient has no complaints or symptoms. Oncologic The patient has no complaints or symptoms. Psychiatric The patient has no complaints or symptoms. Combes, Catriona L. ([id number]) Objective Constitutional Sitting or standing Blood Pressure is within target range for patient.. Pulse regular and within target range for patient.Marland Kitchen Respirations regular, non-labored and within target range.. Temperature is normal and within the target range for the patient.Marland Kitchen appears in no distress. Vitals Time Taken: 9:00 AM, Height: 58 in, Source: Stated, Weight: 113 lbs, Source: Stated, BMI: 23.6, Temperature: 98.2 F, Pulse: 84 bpm, Respiratory Rate: 18 breaths/min, Blood Pressure: 126/55 mmHg. Eyes Conjunctivae clear. No discharge. Neck Neck supple and symmetrical. No masses or crepitus. Respiratory Respiratory effort is easy and symmetric bilaterally. Rate is normal at rest and on room air.. Musculoskeletal She has good range of motion at the DIP of the right fifth finger. Not full because of the swelling in the wound but certainly has good tendon function. I see no involvement of the PIP joint itself. Neurological The patient has normal sensation in the right fifth finger including the tip of the finger.Marland Kitchen Psychiatric No evidence of depression, anxiety, or agitation. Calm, cooperative, and communicative. Appropriate interactions and affect.. General Notes: Wound exam; the patient has a  superficial wound from the mid part of the middle phalanx all the way to the tip of the right fifth finger. This remove the nail bed. Most of the rest of this is superficial there is no bone or other exposed structures. No evidence of infection. Integumentary (Hair, Skin) Wound #1 status is Open. Original cause of wound was Trauma. The wound is located on the Right Hand - 5th Digit. The wound measures 3.5cm length x 1.5cm width x 0.1cm depth; 4.123cm^2 area and 0.412cm^3 volume. There is Fat Layer (Subcutaneous Tissue) Exposed exposed. There is no tunneling or undermining noted. There is a medium amount of drainage noted. The wound margin is thickened. There is no granulation within the wound bed. There is a small (1-33%) amount of necrotic tissue within the wound bed including Eschar. The periwound skin appearance exhibited: Maceration. The periwound skin appearance did not exhibit: Callus, Crepitus, Excoriation, Induration, Rash, Scarring, Dry/Scaly, Atrophie Blanche, Cyanosis, Ecchymosis, Hemosiderin Staining, Mottled, Pallor, Rubor,  Erythema. Assessment Active Problems ICD-10 Laceration without foreign body of right hand, subsequent encounter Non-pressure chronic ulcer of skin of other sites with other specified severity Spink, Makhayla L. ([id number]) Plan Wound Cleansing: Wound #1 Right Hand - 5th Digit: Clean wound with Normal Saline. Cleanse wound with mild soap and water Anesthetic (add to Medication List): Wound #1 Right Hand - 5th Digit: Topical Lidocaine 4% cream applied to wound bed prior to debridement (In Clinic Only). Primary Wound Dressing: Wound #1 Right Hand - 5th Digit: Other: - triple antibiotic Secondary Dressing: Wound #1 Right Hand - 5th Digit: [place] Conform/Kerlix Dressing Change Frequency: Wound #1 Right Hand - 5th Digit: Change dressing every day. Follow-up Appointments: Wound #1 Right Hand - 5th Digit: Return Appointment in 1 week. 1. After some  deliberation I elected simply to apply topical antibiotics to this area, Telfa, Kerlix and conform 2. The skin around this looked healthy. There was some maceration I do not think Xeroform is necessary. 3. Had some thoughts about silver alginate but will see how this looks next week. 4. She had an x-ray done in the ER that was negative. 5. As far as I can tell her range of motion/tendon and nerve function in the finger are normal. I do not think she requires a hand surgeon appointment at this point Electronic Signature(s) Signed: 01/11/2019 4:42:30 PM By: Baltazar Najjar MD Entered By: Baltazar Najjar on 01/11/2019 10:12:52 Barse, Sravya L. ([id number]) -------------------------------------------------------------------------------- ROS/PFSH Details Patient Name: Krasner, Habiba L. Date of Service: 01/11/2019 8:45 AM Medical Record Number: [id number] Patient Account Number: [id number] Date of Birth/Sex: [birth year] (73 y.o. F) Treating RN: Arnette Norris Primary Care Provider: Dorothey Baseman Other Clinician: Referring Provider: Terance Hart, DAVID Treating Provider/Extender: Altamese [place] in Treatment: 0 Information Obtained From Patient Wound History Do you currently have one or more open woundso Yes How many open wounds do you currently haveo 1 Approximately how long have you had your woundso 2 days How have you been treating your wound(s) until nowo pressure dressing, gauze, xeroform Has your wound(s) ever healed and then re-openedo No Have you had any lab work done in the past montho No Have you tested positive for an antibiotic resistant organism (MRSA, VRE)o No Have you tested positive for osteomyelitis (bone infection)o No Have you had any tests for circulation on your legso No Eyes Complaints and Symptoms: Positive for: Glasses / Contacts - distance Medical History: Negative for: Cataracts; Glaucoma; Optic Neuritis Cardiovascular Complaints and Symptoms: Negative  for: Chest pain; LE edema Review of System Notes: Hyperlipidemia Medical History: Positive for: Hypertension Negative for: Angina; Arrhythmia; Congestive Heart Failure; Coronary Artery Disease; Deep Vein Thrombosis; Hypotension; Myocardial Infarction; Peripheral Arterial Disease; Peripheral Venous Disease; Phlebitis; Vasculitis Integumentary (Skin) Complaints and Symptoms: 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 ([id number]) Complaints and Symptoms: No Complaints or Symptoms Medical History: Negative for: Chronic sinus problems/congestion; Middle ear problems Hematologic/Lymphatic Complaints and Symptoms: No Complaints or Symptoms Medical History: Negative for: Anemia; Hemophilia; Human Immunodeficiency Virus; Lymphedema; Sickle Cell Disease Respiratory Complaints and Symptoms: No Complaints or Symptoms Medical History: Negative for: Aspiration; Asthma; Chronic Obstructive Pulmonary Disease (COPD); Pneumothorax; Sleep Apnea; Tuberculosis Gastrointestinal Complaints and Symptoms: No Complaints or Symptoms Medical History: Negative for: Cirrhosis ; Colitis; Crohnos; Hepatitis A; Hepatitis B; Hepatitis C Endocrine Complaints and  Symptoms: No Complaints or Symptoms Medical History: Negative for: Type I Diabetes; Type II Diabetes Genitourinary Complaints and Symptoms: No Complaints or Symptoms Medical History: Negative for: End Stage Renal Disease Immunological Complaints and Symptoms: No Complaints or Symptoms Medical History: Negative for: Lupus Erythematosus; Raynaudos; Scleroderma Musculoskeletal Complaints and Symptoms: No Complaints or Symptoms Regino, Alan L. ([id number]) Medical  History: Negative for: Gout; Rheumatoid Arthritis; Osteoarthritis; Osteomyelitis Past Medical History Notes: Arthritis Neurologic Complaints and Symptoms: No Complaints or Symptoms Medical History: Negative for: Dementia; Neuropathy; Quadriplegia; Paraplegia; Seizure Disorder Oncologic Complaints and Symptoms: No Complaints or Symptoms Medical History: Negative for: Received Chemotherapy; Received Radiation Psychiatric Complaints and Symptoms: No Complaints or Symptoms Medical History: Negative for: Anorexia/bulimia; Confinement Anxiety Immunizations Pneumococcal Vaccine: Received Pneumococcal Vaccination: No Implantable Devices None Family and Social History Cancer: Yes - Mother; Diabetes: No; Heart Disease: Yes - Father,Siblings; Hypertension: Yes - Siblings; Kidney Disease: No; Lung Disease: No; Seizures: No; Stroke: Yes - Father; Thyroid Problems: No; Tuberculosis: No; Never smoker; Marital Status - Married; Alcohol Use: Never; Drug Use: No History; Caffeine Use: Daily - coffee; Financial Concerns: No; Food, Clothing or Shelter Needs: No; Support System Lacking: No; Transportation Concerns: No; Advanced Directives: No; Living Will: Yes; Medical Power of Attorney: No Electronic Signature(s) Signed: 01/11/2019 4:42:30 PM By: Baltazar Najjarobson, Benzion Mesta MD Signed: 01/12/2019 7:54:32 AM By: Arnette NorrisBiell, Kristina Entered By: Arnette NorrisBiell, Kristina on 01/11/2019 09:13:16 [place]assa, Shantelle L. ([id number]) -------------------------------------------------------------------------------- SuperBill Details Patient Name: Trentman, Elyce L. Date of Service: 01/11/2019 Medical Record Number: [id number] Patient Account Number: [id number] Date of Birth/Sex: 03/24/1946 [age] y.o. F) Treating RN: Huel CoventryWoody, Kim Primary Care Provider: Dorothey BasemanBRONSTEIN, DAVID Other Clinician: Referring Provider: Terance HartBRONSTEIN, DAVID Treating Provider/Extender: Altamese CarolinaOBSON, Jayceion Lisenby G Weeks in Treatment: 0 Diagnosis Coding ICD-10 Codes Code  Description [phone] Laceration without foreign body of right hand, subsequent encounter L98.498 Non-pressure chronic ulcer of skin of other sites with other specified severity Facility Procedures CPT4 Code: [id number] Description: 99213 - WOUND CARE VISIT-LEV 3 EST PT Modifier: Quantity: 1 Physician Procedures CPT4 Code Description: [id number] - WC PHYS LEVEL 2 - NEW PT ICD-10 Diagnosis Description S61.411D Laceration without foreign body of right hand, subsequent encount L98.498 Non-pressure chronic ulcer of skin of other sites with other spec Modifier: er ified severity Quantity: 1 Electronic Signature(s) Signed: 01/11/2019 4:42:30 PM By: Baltazar Najjarobson, Kellyn Mccary MD Entered By: Baltazar Najjarobson, Alyse Kathan on 01/11/2019 10:13:21
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
## 2019-01-18 ENCOUNTER — Encounter: Payer: Medicare Other | Attending: Internal Medicine | Admitting: Internal Medicine

## 2019-01-18 DIAGNOSIS — L98498 Non-pressure chronic ulcer of skin of other sites with other specified severity: Secondary | ICD-10-CM | POA: Insufficient documentation

## 2019-01-18 DIAGNOSIS — E785 Hyperlipidemia, unspecified: Secondary | ICD-10-CM | POA: Insufficient documentation

## 2019-01-18 DIAGNOSIS — S61411D Laceration without foreign body of right hand, subsequent encounter: Secondary | ICD-10-CM | POA: Diagnosis not present

## 2019-01-18 DIAGNOSIS — I10 Essential (primary) hypertension: Secondary | ICD-10-CM | POA: Insufficient documentation

## 2019-01-20 NOTE — Progress Notes (Signed)
Guay, Corinda L. ([id number]) Visit Report for 01/18/2019 Debridement Details Patient Name: Victoria [name], [name] Date of Service: 01/18/2019 8:15 AM Medical Record Number: [id number] Patient Account Number: [id number] Date of Birth/Sex: Aug 19, 1946 (73 y.o. F) Treating RN: Huel Coventry Primary Care Provider: Terance Hart, DAVID Other Clinician: Referring Provider: Terance Hart, DAVID Treating Provider/Extender: Altamese [place] in Treatment: 1 Debridement Performed for Wound #1 Right Hand - 5th Digit Assessment: Performed By: Physician Maxwell Caul, MD Debridement Type: Debridement Level of Consciousness (Pre- Awake and Alert procedure): Pre-procedure Verification/Time Yes - 08:32 Out Taken: Start Time: 08:34 Pain Control: Lidocaine Total Area Debrided (L x W): 3.1 (cm) x 1.5 (cm) = 4.65 (cm) Tissue and other material Viable, Non-Viable, Eschar, Slough, Subcutaneous, Slough debrided: Level: Skin/Subcutaneous Tissue Debridement Description: Excisional Instrument: Curette Bleeding: Minimum Hemostasis Achieved: Pressure End Time: 08:37 Response to Treatment: Procedure was tolerated well Level of Consciousness Awake and Alert (Post-procedure): Post Debridement Measurements of Total Wound Length: (cm) 3.1 Width: (cm) 1.5 Depth: (cm) 0.2 Volume: (cm) 0.73 Character of Wound/Ulcer Post Debridement: Stable Post Procedure Diagnosis Same as Pre-procedure Electronic Signature(s) Signed: 01/18/2019 5:05:37 PM By: Baltazar Najjar MD Signed: 01/18/2019 5:29:19 PM By: Elliot Gurney, BSN, RN, CWS, Kim RN, BSN Entered By: Baltazar Najjar on 01/18/2019 08:44:42 Greggs, Gabriellia L. ([id number]) -------------------------------------------------------------------------------- HPI Details Patient Name: Haren, Olene L. Date of Service: 01/18/2019 8:15 AM Medical Record Number: [id number] Patient Account Number: [id number] Date of Birth/Sex: 1946/04/20 (72 y.o. 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 Her hand was gloved. She had an avulsion injury from the mid right middle phalanx all the way up and including her nailbed to the tip of her finger. There was tissue hanging off she actually remove this. She presented to the ER on 01/09/2019 after this happened. An x-ray was negative. There was no tissue for suturing. She had Bactrim and Keflex provided. The wound was dressed with surgie gel, Xeroform and Coban. She presents here for follow-up. Past medical history includes hyperlipidemia and hypertension 3/4; traumatic area on her right fifth dorsal finger. Measurements are slightly better although surface has tightly adherent debris. I did simply use topical antibiotics last week but will change her to silver alginate this week. Electronic Signature(s) Signed: 01/18/2019 5:05:37 PM By: Baltazar Najjar MD Entered By: Baltazar Najjar on 01/18/2019 08:46:12 Dattilo, Cena L. ([id number]) -------------------------------------------------------------------------------- Physical Exam Details Patient Name: Senat, Anntoinette L. Date of Service: 01/18/2019 8:15 AM Medical Record Number: [id number] Patient Account Number: [id number] Date of Birth/Sex: [birth year] (73 y.o. F) Treating RN: Huel Coventry Primary Care Provider: Dorothey Baseman Other Clinician: Referring Provider: Terance Hart, DAVID Treating Provider/Extender: Maxwell Caul Weeks in Treatment: 1 Constitutional Sitting or standing Blood Pressure is within target range for patient.. Pulse regular and within target range for patient.Marland Kitchen Respirations regular, non-labored and within  target range.. Temperature is normal and within the target range for the patient.Marland Kitchen appears in no distress. Notes Wound exam; the patient's wound is slightly smaller. However surface still has a lot of debris which is reasonably tightly adherent. There must have been some drainage here. Using a #3 curette this was removed. Hemostasis with direct pressure. She tolerated this well. There is no evidence of  infection. No swelling of the DIP or PIP joint. No surrounding cellulitis Electronic Signature(s) Signed: 01/18/2019 5:05:37 PM By: Baltazar Najjar MD Entered By: Baltazar Najjar on 01/18/2019 08:47:20 Guadalupe, Louetta L. ([id number]) -------------------------------------------------------------------------------- Physician Orders Details Patient Name: Gabor, Maygen L. Date of Service: 01/18/2019 8:15 AM Medical Record Number: [id number] Patient Account Number: [id number] Date of Birth/Sex: [birth year] (73 y.o. F) Treating RN: Huel Coventry Primary Care Provider: Dorothey Baseman Other Clinician: Referring Provider: Terance Hart, DAVID Treating Provider/Extender: Altamese Avery in Treatment: 1 Verbal / Phone Orders: No Diagnosis Coding Wound Cleansing Wound #1 Right Hand - 5th Digit o Cleanse wound with mild soap and water Anesthetic (add to Medication List) Wound #1 Right Hand - 5th Digit o Topical Lidocaine 4% cream applied to wound bed prior to debridement (In Clinic Only). o Benzocaine Topical Anesthetic Spray applied to wound bed prior to debridement (In Clinic Only). Primary Wound Dressing Wound #1 Right Hand - 5th Digit o Silver Alginate Secondary Dressing Wound #1 Right Hand - 5th Digit o Conform/Kerlix - secured with stretch net Dressing Change Frequency Wound #1 Right Hand - 5th Digit o Change dressing every day. Follow-up Appointments Wound #1 Right Hand - 5th Digit o Return Appointment in 1 week. Electronic Signature(s) Signed: 01/18/2019 5:05:37 PM By:  Baltazar Najjar MD Signed: 01/18/2019 5:29:19 PM By: Elliot Gurney, BSN, RN, CWS, Kim RN, BSN Entered By: Elliot Gurney, BSN, RN, CWS, Kim on 01/18/2019 08:37:57 Ghee, Mushka L. ([id number]) -------------------------------------------------------------------------------- Problem List Details Patient Name: Dunford, Rosia L. Date of Service: 01/18/2019 8:15 AM Medical Record Number: [id number] Patient Account Number: [id number] Date of Birth/Sex: [birth year] (73 y.o. 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 ([id number]) -------------------------------------------------------------------------------- Progress Note Details Patient Name: Isidore, Janayla L. Date of Service: 01/18/2019 8:15 AM Medical Record Number: [id number] Patient Account Number: [id number] Date of Birth/Sex: 1946-01-24 (73 y.o. 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 Her hand was gloved. She had an avulsion injury from the mid right middle phalanx all the way up and  including her nailbed to the tip of her finger. There was tissue hanging off she actually remove this. She presented to the ER on 01/09/2019 after this happened. An x-ray was negative. There was no tissue for suturing. She had Bactrim and Keflex provided. The wound was dressed with surgie gel, Xeroform and Coban. She presents here for follow-up. Past medical history includes hyperlipidemia and hypertension 3/4; traumatic area on her right fifth dorsal finger. Measurements are slightly better although surface has tightly adherent debris. I did simply use topical antibiotics last week but will change her to silver alginate this week. Objective Constitutional Sitting or standing Blood Pressure is within target range for patient.. Pulse regular and within target  range for patient.Marland Kitchen Respirations regular, non-labored and within target range.. Temperature is normal and within the target range for the patient.Marland Kitchen appears in no distress. Vitals Time Taken: 8:18 AM, Height: 58 in, Weight: 113 lbs, BMI: 23.6, Temperature: 97.5 F, Pulse: 73 bpm, Respiratory Rate: 18 breaths/min, Blood Pressure: 129/67 mmHg. General Notes: Wound exam; the patient's wound is slightly smaller. However surface still has a lot of debris which is reasonably tightly adherent. There must have been some drainage here. Using a #3 curette this was removed. Hemostasis with direct pressure. She tolerated this well. There is no evidence of infection. No swelling of the DIP or PIP joint. No surrounding cellulitis Integumentary (Hair, Skin) Wound #1 status is Open. Original cause of wound was Trauma. The wound is located on the Right Hand - 5th Digit. The wound measures 3.1cm length x 1.5cm width x 0.1cm depth; 3.652cm^2 area and 0.365cm^3 volume. There is Fat Layer (Subcutaneous Tissue) Exposed exposed. There is no tunneling or undermining noted. There is a small amount of serous drainage noted. The wound margin is thickened. There is no  granulation within the wound bed. There is a small (1-33%) amount of necrotic tissue within the wound bed including Eschar. The periwound skin appearance exhibited: Maceration. The periwound skin appearance did not exhibit: Callus, Crepitus, Excoriation, Induration, Rash, Scarring, Dry/Scaly, Atrophie Langbehn, Briseis L. ([id number]) Blanche, Cyanosis, Ecchymosis, Hemosiderin Staining, Mottled, Pallor, Rubor, Erythema. The periwound has tenderness on palpation. Assessment Active Problems ICD-10 Laceration without foreign body of right hand, subsequent encounter Non-pressure chronic ulcer of skin of other sites with other specified severity Procedures Wound #1 Pre-procedure diagnosis of Wound #1 is a Trauma, Other located on the Right Hand - 5th Digit . There was a Excisional Skin/Subcutaneous Tissue Debridement with a total area of 4.65 sq cm performed by Maxwell Caul, MD. With the following instrument(s): Curette to remove Viable and Non-Viable tissue/material. Material removed includes Eschar, Subcutaneous Tissue, and Slough after achieving pain control using Lidocaine. No specimens were taken. A time out was conducted at 08:32, prior to the start of the procedure. A Minimum amount of bleeding was controlled with Pressure. The procedure was tolerated well. Post Debridement Measurements: 3.1cm length x 1.5cm width x 0.2cm depth; 0.73cm^3 volume. Character of Wound/Ulcer Post Debridement is stable. Post procedure Diagnosis Wound #1: Same as Pre-Procedure Plan Wound Cleansing: Wound #1 Right Hand - 5th Digit: Cleanse wound with mild soap and water Anesthetic (add to Medication List): Wound #1 Right Hand - 5th Digit: Topical Lidocaine 4% cream applied to wound bed prior to debridement (In Clinic Only). Benzocaine Topical Anesthetic Spray applied to wound bed prior to debridement (In Clinic Only). Primary Wound Dressing: Wound #1 Right Hand - 5th Digit: Silver Alginate Secondary  Dressing: Wound #1 Right Hand - 5th Digit: Conform/Kerlix - secured with stretch net Dressing Change Frequency: Wound #1 Right Hand - 5th Digit: Change dressing every day. Follow-up Appointments: Wound #1 Right Hand - 5th Digit: Return Appointment in 1 week. Frayre, Adelaine L. ([id number]) 1. Debridement with a #3 curette as noted previously 2. Silver alginate hopefully this will reduce bacterial bioburden and absorb drainage. 3. To wash the wound off gently with soap and water and dress it once a day at home Electronic Signature(s) Signed: 01/18/2019 5:05:37 PM By: Baltazar Najjar MD Entered By: Baltazar Najjar on 01/18/2019 08:48:58 Quintela, Akyla L. ([id number]) -------------------------------------------------------------------------------- SuperBill Details Patient Name: Hord, Crystalmarie L. Date of Service: 01/18/2019 Medical Record Number: [id number] Patient Account Number: [id number] Date of  Birth/Sex: 04/05/1946 (74 y.o. F) Treating RN: Huel Coventry Primary Care Provider: Dorothey Baseman Other Clinician: Referring Provider: Terance Hart, DAVID Treating Provider/Extender: Altamese [place]ett in Treatment: 1 Diagnosis Coding ICD-10 Codes Code Description [phone] Laceration without foreign body of right hand, subsequent encounter L98.498 Non-pressure chronic ulcer of skin of other sites with other specified severity Facility Procedures CPT4 Code Description: [id number] 11042 - DEB SUBQ TISSUE 20 SQ CM/< ICD-10 Diagnosis Description S61.411D Laceration without foreign body of right hand, subsequent encount L98.498 Non-pressure chronic ulcer of skin of other sites with other spec Modifier: er ified severity Quantity: 1 Physician Procedures CPT4 Code Description: [id number] 11042 - WC PHYS SUBQ TISS 20 SQ CM ICD-10 Diagnosis Description S61.411D Laceration without foreign body of right hand, subsequent encount L98.498 Non-pressure chronic ulcer of skin of other sites with other spec Modifier:  er ified severity Quantity: 1 Electronic Signature(s) Signed: 01/18/2019 5:05:37 PM By: Baltazar Najjar MD Entered By: Baltazar Najjar on 01/18/2019 08:49:14
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
## 2019-01-25 ENCOUNTER — Encounter: Payer: Medicare Other | Admitting: Internal Medicine

## 2019-01-25 DIAGNOSIS — S61411D Laceration without foreign body of right hand, subsequent encounter: Secondary | ICD-10-CM | POA: Diagnosis not present

## 2019-01-26 NOTE — Progress Notes (Signed)
Say, Charlsey L. ([id number]) Visit Report for 01/25/2019 HPI Details Patient Name: Victoria [name], [name] Date of Service: 01/25/2019 8:15 AM Medical Record Number: [id number] Patient Account Number: [id number] Date of Birth/Sex: 02/01/1946 (73 y.o. 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 Her hand was gloved. She had an avulsion injury from the mid right middle phalanx all the way up and including her nailbed to the tip of her finger. There was tissue hanging off she actually remove this. She presented to the ER on 01/09/2019 after this happened. An x-ray was negative. There was no tissue for suturing. She had Bactrim and Keflex provided. The wound was dressed with surgie gel, Xeroform and Coban. She presents here for follow-up. Past medical history includes hyperlipidemia and hypertension 3/4; traumatic area on her right fifth dorsal finger. Measurements are slightly better although surface has tightly adherent debris. I did simply use topical antibiotics last week but will change her to silver alginate this week. 3/11; traumatic area on the right dorsal fifth finger involving the nailbed. We have been using silver alginate. Nice progression today. Electronic Signature(s) Signed: 01/25/2019 5:42:07 PM By: Baltazar Najjar MD Entered By: Baltazar Najjar on 01/25/2019 08:47:57 Buswell, Norinne L. ([id number]) -------------------------------------------------------------------------------- Physical Exam Details Patient Name: Victoria [name], [name] Date of Service: 01/25/2019 8:15 AM Medical Record Number: [id number] Patient Account  Number: [id number] Date of Birth/Sex: Jul 15, 1946 (73 y.o. Female) Treating RN: Huel Coventry Primary Care Provider: Dorothey Baseman Other Clinician: Referring Provider: Dorothey Baseman Treating Provider/Extender: Altamese [place] in Treatment: 2 Constitutional Patient is hypertensive.. Pulse regular and within target range for patient.Marland Kitchen Respirations regular, non-labored and within target range.. Temperature is normal and within the target range for the patient.Marland Kitchen appears in no distress. Notes Wound exam; the patient's wound is quite a bit smaller. She is epithelialized the tip of her finger and a large area of the wound from the base of the nailbed towards the DIP. There is no epithelialization yet of the nailbed itself. No evidence of surrounding infection. She has good range of motion Electronic Signature(s) Signed: 01/25/2019 5:42:07 PM By: Baltazar Najjar MD Entered By: Baltazar Najjar on 01/25/2019 08:48:54 Scalera, Karenna L. ([id number]) -------------------------------------------------------------------------------- Physician Orders Details Patient Name: Victoria [name], [name] Date of Service: 01/25/2019 8:15 AM Medical Record Number: [id number] Patient Account Number: [id number] Date of Birth/Sex: 04-11-1946 (73 y.o. Female) Treating RN: Huel Coventry Primary Care Provider: Dorothey Baseman Other Clinician: Referring Provider: Terance Hart, DAVID Treating Provider/Extender: Altamese [place] in Treatment: 2 Verbal / Phone Orders: No Diagnosis Coding Wound Cleansing Wound #1 Right Hand - 5th Digit o Cleanse wound with mild soap and water Anesthetic (add to Medication List) Wound #1 Right Hand - 5th Digit o Topical Lidocaine 4% cream applied to wound bed prior to debridement (In Clinic Only). Primary Wound Dressing Wound #1 Right Hand - 5th Digit o Silver Alginate Secondary Dressing Wound #1 Right Hand - 5th Digit o Conform/Kerlix - secured with stretch net Dressing  Change Frequency Wound #1 Right Hand - 5th Digit o Change dressing every other day. Follow-up Appointments Wound #1 Right Hand - 5th Digit o Return Appointment in 2 weeks. Electronic Signature(s) Signed: 01/25/2019 5:23:21 PM By: Elliot Gurney,  BSN, RN, CWS, Radio producer, BSN Signed: 01/25/2019 5:42:07 PM By: Baltazar Najjar MD Entered By: Elliot Gurney, BSN, RN, CWS, Kim on 01/25/2019 08:44:02 Victoria [name], [name] ([id number]) -------------------------------------------------------------------------------- Problem List Details Patient Name: Victoria [name], [name] Date of Service: 01/25/2019 8:15 AM Medical Record Number: [id number] Patient Account Number: [id number] Date of Birth/Sex: 01-Oct-1946 (73 y.o. 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 ([id number]) -------------------------------------------------------------------------------- Progress Note Details Patient Name: Victoria [name], [name] Date of Service: 01/25/2019 8:15 AM Medical Record Number: [id number] Patient Account Number: [id number] Date of Birth/Sex: 1946/10/17 (73 y.o. 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 Her hand was gloved. She had an avulsion injury from the mid right middle phalanx all the way up and including her nailbed to the tip of her finger. There was tissue hanging off she actually remove this. She presented to the ER on 01/09/2019 after this happened. An x-ray was negative. There was no tissue for suturing. She had Bactrim and Keflex provided. The wound was dressed with surgie gel, Xeroform and Coban. She presents here for follow-up. Past medical history includes hyperlipidemia and hypertension 3/4; traumatic area on her right fifth dorsal finger. Measurements are slightly better although surface has tightly adherent debris. I did simply use topical antibiotics last week but will change her to silver alginate this week. 3/11; traumatic area on the right dorsal fifth finger involving the nailbed. We have been using silver alginate. Nice progression today. Objective Constitutional Patient is hypertensive.. Pulse regular and within target range for patient.Marland Kitchen Respirations regular, non-labored and within target range.. Temperature is normal and within the target range for the patient.Marland Kitchen appears in no distress. Vitals Time Taken: 8:25 AM, Height: 58 in, Weight: 113 lbs, BMI: 23.6, Temperature: 97.8 F, Pulse: 76 bpm, Respiratory Rate: 16 breaths/min, Blood Pressure: 153/71 mmHg. General Notes: Wound exam; the patient's wound is quite a bit smaller. She is epithelialized the tip of her finger and a large area of the wound from the base of the nailbed towards the DIP. There is no epithelialization yet of the nailbed itself. No evidence of surrounding infection. She has good range of motion Integumentary (Hair, Skin) Wound #1 status is Open. Original cause of wound was Trauma. The wound is located on the Right Hand - 5th Digit. The wound  measures 2.5cm length x 1cm width x 0.1cm depth; 1.963cm^2 area and 0.196cm^3 volume. There is Fat Layer (Subcutaneous Tissue) Exposed exposed. There is no tunneling or undermining noted. There is a none present amount of drainage noted. The wound margin is thickened. There is no granulation within the wound bed. There is a small (1-33%) amount of necrotic tissue within the wound bed including Adherent Slough. The periwound skin appearance did  not exhibit: Callus, Crepitus, Excoriation, Induration, Rash, Scarring, Dry/Scaly, Maceration, Atrophie Blanche, Cyanosis, Ecchymosis, Riemann, [place] L. ([id number]) Hemosiderin Staining, Mottled, Pallor, Rubor, Erythema. The periwound has tenderness on palpation. 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 Primary Wound Dressing: Wound #1 Right Hand - 5th Digit: Silver Alginate Secondary Dressing: Wound #1 Right Hand - 5th Digit: Conform/Kerlix - secured with stretch net Dressing Change Frequency: Wound #1 Right Hand - 5th Digit: Change dressing every other day. Follow-up Appointments: Wound #1 Right Hand - 5th Digit: Return Appointment in 2 weeks. 1. I see no reason to change the primary dressing. We are using silver alginate with gauze she is changing this daily. Follow-up in 2 weeks Electronic Signature(s) Signed: 01/25/2019 5:42:07 PM By: Baltazar Najjarobson, [place] Gentle MD Entered By:  Baltazar Najjarobson, Aldwin Micalizzi on 01/25/2019 08:49:27 Victoria [name], [name] ([id number]) -------------------------------------------------------------------------------- SuperBill Details Patient Name: Victoria [name], [name] Date of Service: 01/25/2019 Medical Record Number: [id number] Patient Account Number: [id number] Date of Birth/Sex: Aug 14, 1946 [age] y.o. Female) Treating RN: Huel CoventryWoody, Kim Primary Care Provider: Dorothey BasemanBRONSTEIN, DAVID Other Clinician: Referring Provider: Terance HartBRONSTEIN, DAVID Treating Provider/Extender: Maxwell CaulOBSON, Kaydence Baba [place] in Treatment: 2 Diagnosis Coding ICD-10 Codes Code Description [phone] Laceration without foreign body of right hand, subsequent encounter L98.498 Non-pressure chronic ulcer of skin of other sites with other specified severity Facility Procedures CPT4 Code: [id number] Description: [id number] - WOUND CARE VISIT-LEV 2 EST PT Modifier: Quantity: 1 Physician Procedures CPT4 Code Description: [id number] - WC PHYS LEVEL 2 - EST PT ICD-10 Diagnosis Description S61.411D Laceration without foreign body of right hand, subsequent encount L98.498 Non-pressure chronic ulcer of skin of other sites with other spec Modifier: er ified severity Quantity: 1 Electronic Signature(s) Signed: 01/25/2019 5:42:07 PM By: Baltazar Najjarobson, Catalyna Reilly MD Entered By: Baltazar Najjarobson, Ramesses Crampton on 01/25/2019 08:49:51
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
## 2019-02-08 ENCOUNTER — Other Ambulatory Visit: Payer: Self-pay

## 2019-02-08 ENCOUNTER — Encounter: Payer: Medicare Other | Admitting: Internal Medicine

## 2019-02-08 DIAGNOSIS — S61411D Laceration without foreign body of right hand, subsequent encounter: Secondary | ICD-10-CM | POA: Diagnosis not present
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
## 2019-02-09 NOTE — Progress Notes (Signed)
[name], [name] ([id number]) Visit Report for 02/08/2019 Debridement Details Patient Name: Victoria [name], [name] Date of Service: 02/08/2019 8:15 AM Medical Record Number: [id number] Patient Account Number: [id number] Date of Birth/Sex: 02-Oct-1946 (73 y.o. F) Treating RN: Huel Coventry Primary Care Provider: Terance Hart, DAVID Other Clinician: Referring Provider: Terance Hart, DAVID Treating Provider/Extender: Altamese [place] in Treatment: 4 Debridement Performed for Wound #1 Right Hand - 5th Digit Assessment: Performed By: Physician Maxwell Caul, MD Debridement Type: Debridement Level of Consciousness (Pre- Awake and Alert procedure): Pre-procedure Verification/Time Yes - 08:45 Out Taken: Start Time: 08:46 Pain Control: Lidocaine Total Area Debrided (L x W): 2 (cm) x 0.4 (cm) = 0.8 (cm) Tissue and other material Viable, Non-Viable, Eschar, Slough, Subcutaneous, Slough debrided: Level: Skin/Subcutaneous Tissue Debridement Description: Excisional Instrument: Curette Bleeding: Minimum Hemostasis Achieved: Pressure End Time: 08:49 Response to Treatment: Procedure was tolerated well Level of Consciousness Awake and Alert (Post-procedure): Post Debridement Measurements of Total Wound Length: (cm) 2 Width: (cm) 0.7 Depth: (cm) 0.2 Volume: (cm) 0.22 Character of Wound/Ulcer Post Debridement: Stable Post Procedure Diagnosis Same as Pre-procedure Electronic Signature(s) Signed: 02/08/2019 5:18:51 PM By: Baltazar Najjar MD Signed: 02/09/2019 9:14:45 AM By: Elliot Gurney, BSN, RN, CWS, Kim RN, BSN Entered By: Baltazar Najjar on 02/08/2019 09:05:08 [name], [name] ([id number]) -------------------------------------------------------------------------------- HPI Details Patient Name: Victoria [name], [name] Date of Service: 02/08/2019 8:15 AM Medical Record Number: [id number] Patient Account Number: [id number] Date of Birth/Sex: 05-15-1946 (73 y.o. 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 Her hand was gloved. She had an avulsion injury from the mid right middle phalanx all the way up and including her nailbed to the tip of her finger. There was tissue hanging off she actually remove this. She presented to the ER on 01/09/2019 after this happened. An x-ray was negative. There was no tissue for suturing. She had Bactrim and Keflex provided. The wound was dressed with surgie gel, Xeroform and Coban. She presents here for follow-up. Past medical history includes hyperlipidemia and hypertension 3/4; traumatic area on her right fifth dorsal finger. Measurements are slightly better although surface has tightly adherent debris. I did simply use topical antibiotics last week but will change her to silver alginate this week. 3/11; traumatic area on the right dorsal fifth finger involving the nailbed. We have been using silver alginate. Nice progression today. 3/25; patient arrives with tight surface eschar over the majority of the wound. She has been using silver alginate Electronic Signature(s) Signed: 02/08/2019 5:18:51 PM By: Baltazar Najjar MD Entered By: Baltazar Najjar on 02/08/2019 09:06:31 [name], [name] ([id number]) -------------------------------------------------------------------------------- Physical Exam Details Patient Name: Victoria [name], [name] Date of Service: 02/08/2019 8:15 AM Medical Record Number: [id number] Patient Account Number: [id number] Date of Birth/Sex: [birth year] (73 y.o. F) Treating RN: Huel Coventry Primary Care Provider: Dorothey Baseman Other Clinician: Referring Provider: Terance Hart,  DAVID Treating Provider/Extender: Altamese  in Treatment: 4 Notes Wound exam; the patient arrived with a thick eschar over the majority of the wound. This was removed with a #3 curette. All of this is actually healed except for the base of the fifth digit nail bed. And even this looks healthy post debridement. There is no evidence of  infection hemostasis with direct pressure Electronic Signature(s) Signed: 02/08/2019 5:18:51 PM By: Baltazar Najjar MD Entered By: Baltazar Najjar on 02/08/2019 09:07:58 [name], [name] ([id number]) -------------------------------------------------------------------------------- Physician Orders Details Patient Name: Victoria [name], [name] Date of Service: 02/08/2019 8:15 AM Medical Record Number: [id number] Patient Account Number: [id number] Date of Birth/Sex: Jan 30, 1946 (73 y.o. F) Treating RN: Huel Coventry Primary Care Provider: Dorothey Baseman Other Clinician: Referring Provider: Terance Hart, DAVID Treating Provider/Extender: Altamese [place] in Treatment: 4 Verbal / Phone Orders: No Diagnosis Coding Wound Cleansing Wound #1 Right Hand - 5th Digit o Cleanse wound with mild soap and water Anesthetic (add to Medication List) Wound #1 Right Hand - 5th Digit o Topical Lidocaine 4% cream applied to wound bed prior to debridement (In Clinic Only). o Benzocaine Topical Anesthetic Spray applied to wound bed prior to debridement (In Clinic Only). Primary Wound Dressing Wound #1 Right Hand - 5th Digit o Silver Alginate Secondary Dressing Wound #1 Right Hand - 5th Digit o Conform/Kerlix - secured with stretch net Dressing Change Frequency Wound #1 Right Hand - 5th Digit o Change dressing every other day. Follow-up Appointments Wound #1 Right Hand - 5th Digit o Return Appointment in 2 weeks. Electronic Signature(s) Signed: 02/08/2019 5:18:51 PM By: Baltazar Najjar MD Signed: 02/09/2019 9:14:45 AM By: Elliot Gurney, BSN, RN, CWS, Kim RN,  BSN Entered By: Elliot Gurney, BSN, RN, CWS, Kim on 02/08/2019 08:47:56 [name], [name] ([id number]) -------------------------------------------------------------------------------- Problem List Details Patient Name: Victoria [name], [name] Date of Service: 02/08/2019 8:15 AM Medical Record Number: [id number] Patient Account Number: [id number] Date of Birth/Sex: 1945/11/30 (73 y.o. 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 ([id number]) -------------------------------------------------------------------------------- Progress Note Details Patient Name: Victoria [name], [name] Date of Service: 02/08/2019 8:15 AM Medical Record Number: [id number] Patient Account Number: [id number] Date of Birth/Sex: 04-12-1946 (73 y.o. 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 Her hand was gloved. She had an avulsion injury from the mid right middle phalanx all the way up and including her nailbed to the tip of her finger. There was tissue hanging off  she actually remove this. She presented to the ER on 01/09/2019 after this happened. An x-ray was negative. There was no tissue for suturing. She had Bactrim and Keflex provided. The wound was dressed with surgie gel, Xeroform and Coban. She presents here for follow-up. Past medical history includes hyperlipidemia and hypertension 3/4; traumatic area on her right fifth dorsal finger. Measurements are slightly better although surface has tightly adherent debris. I did simply use topical antibiotics last week but will change her to silver alginate this week. 3/11; traumatic area on the right dorsal fifth finger involving the nailbed. We have been using silver alginate. Nice progression today. 3/25; patient arrives  with tight surface eschar over the majority of the wound. She has been using silver alginate Objective Constitutional Vitals Time Taken: 8:17 AM, Height: 58 in, Weight: 113 lbs, BMI: 23.6, Temperature: 97.7 F, Pulse: 64 bpm, Respiratory Rate: 16 breaths/min, Blood Pressure: 111/60 mmHg. Integumentary (Hair, Skin) Wound #1 status is Open. Original cause of wound was Trauma. The wound is located on the Right Hand - 5th Digit. The wound measures 2cm length x 0.7cm width x 0.1cm depth; 1.1cm^2 area and 0.11cm^3 volume. There is Fat Layer (Subcutaneous Tissue) Exposed exposed. There is no tunneling or undermining noted. There is a none present amount of drainage noted. The wound margin is thickened. There is no granulation within the wound bed. There is a small (1-33%) amount of necrotic tissue within the wound bed including Adherent Slough. The periwound skin appearance did not exhibit: Callus, Crepitus, Excoriation, Induration, Rash, Scarring, Dry/Scaly, Maceration, Atrophie Blanche, Cyanosis, Ecchymosis, Hemosiderin Staining, Mottled, Pallor, Rubor, Erythema. The periwound has tenderness on palpation. Victoria [name], [name] ([id number]) Assessment Active Problems ICD-10 Laceration without  foreign body of right hand, subsequent encounter Non-pressure chronic ulcer of skin of other sites with other specified severity Procedures Wound #1 Pre-procedure diagnosis of Wound #1 is a Trauma, Other located on the Right Hand - 5th Digit . There was a Excisional Skin/Subcutaneous Tissue Debridement with a total area of 0.8 sq cm performed by Maxwell Caul, MD. With the following instrument(s): Curette to remove Viable and Non-Viable tissue/material. Material removed includes Eschar, Subcutaneous Tissue, and Slough after achieving pain control using Lidocaine. No specimens were taken. A time out was conducted at 08:45, prior to the start of the procedure. A Minimum amount of bleeding was controlled with Pressure. The procedure was tolerated well. Post Debridement Measurements: 2cm length x 0.7cm width x 0.2cm depth; 0.22cm^3 volume. Character of Wound/Ulcer Post Debridement is stable. Post procedure Diagnosis Wound #1: Same as Pre-Procedure Plan Wound Cleansing: Wound #1 Right Hand - 5th Digit: Cleanse wound with mild soap and water Anesthetic (add to Medication List): Wound #1 Right Hand - 5th Digit: Topical Lidocaine 4% cream applied to wound bed prior to debridement (In Clinic Only). Benzocaine Topical Anesthetic Spray applied to wound bed prior to debridement (In Clinic Only). Primary Wound Dressing: Wound #1 Right Hand - 5th Digit: Silver Alginate Secondary Dressing: Wound #1 Right Hand - 5th Digit: Conform/Kerlix - secured with stretch net Dressing Change Frequency: Wound #1 Right Hand - 5th Digit: Change dressing every other day. Follow-up Appointments: Wound #1 Right Hand - 5th Digit: Return Appointment in 2 weeks. The we can continue with silver alginate for another 2 weeks. This should be closed the next time we see her Victoria [name], [name] ([id number]) Electronic Signature(s) Signed: 02/08/2019 5:18:51 PM By: Baltazar Najjar MD Entered By: Baltazar Najjar on 02/08/2019  09:08:35 Victoria [name], [name] ([id number]) -------------------------------------------------------------------------------- SuperBill Details Patient Name: Victoria [name], [name] Date of Service: 02/08/2019 Medical Record Number: [id number] Patient Account Number: [id number] Date of Birth/Sex: 05/16/1946 (73 y.o. F) Treating RN: Huel Coventry Primary Care Provider: Dorothey Baseman Other Clinician: Referring Provider: Terance Hart, DAVID Treating Provider/Extender: Altamese [place] in Treatment: 4 Diagnosis Coding ICD-10 Codes Code Description [id number] Laceration without foreign body of right hand, subsequent encounter L98.498 Non-pressure chronic ulcer of skin of other sites with other specified severity Facility Procedures CPT4 Code Description: [id number] 11042 - DEB SUBQ TISSUE 20 SQ CM/< ICD-10 Diagnosis Description L98.498 Non-pressure chronic ulcer of skin of other sites with other spec Modifier: ified severity Quantity:  1 Physician Procedures CPT4 Code Description: [id number] 11042 - WC PHYS SUBQ TISS 20 SQ CM ICD-10 Diagnosis Description L98.498 Non-pressure chronic ulcer of skin of other sites with other spec Modifier: ified severity Quantity: 1 Electronic Signature(s) Signed: 02/08/2019 5:18:51 PM By: Baltazar Najjar MD Entered By: Baltazar Najjar on 02/08/2019 09:08:58

## 2019-02-22 ENCOUNTER — Encounter: Payer: Medicare Other | Attending: Internal Medicine | Admitting: Internal Medicine

## 2019-02-22 ENCOUNTER — Other Ambulatory Visit: Payer: Self-pay

## 2019-02-22 DIAGNOSIS — Z09 Encounter for follow-up examination after completed treatment for conditions other than malignant neoplasm: Secondary | ICD-10-CM | POA: Diagnosis not present

## 2019-02-22 DIAGNOSIS — I10 Essential (primary) hypertension: Secondary | ICD-10-CM | POA: Insufficient documentation

## 2019-02-22 DIAGNOSIS — L98498 Non-pressure chronic ulcer of skin of other sites with other specified severity: Secondary | ICD-10-CM | POA: Diagnosis present

## 2019-02-22 NOTE — Progress Notes (Signed)
Moening, Jalysa L. ([id number]) Visit Report for 02/22/2019 Arrival Information Details Patient Name: Victoria [name], [name] Date of Service: 02/22/2019 8:30 AM Medical Record Number: [id number] Patient Account Number: [id number] Date of Birth/Sex: May 11, 1946 (73 y.o. F) Treating RN: Curtis Sites Primary Care Dominie Benedick: Terance Hart, DAVID Other Clinician: Referring Yoon Barca: Terance Hart, DAVID Treating Ragan Duhon/Extender: Linwood Dibbles, HOYT Weeks in Treatment: 6 Visit Information History Since Last Visit Added or deleted any medications: No Patient Arrived: Ambulatory Any new allergies or adverse reactions: No Arrival Time: 08:41 Had a fall or experienced change in No Accompanied By: self activities of daily living that may affect Transfer Assistance: None risk of falls: Patient Identification Verified: Yes Signs or symptoms of abuse/neglect since last visito No Secondary Verification Process Completed: Yes Hospitalized since last visit: No Implantable device outside of the clinic excluding No cellular tissue based products placed in the center since last visit: Has Dressing in Place as Prescribed: Yes Pain Present Now: No Electronic Signature(s) Signed: 02/22/2019 2:47:29 PM By: Curtis Sites Entered By: Curtis Sites on 02/22/2019 08:42:56 Urey, Zhaniya L. ([id number]) -------------------------------------------------------------------------------- Clinic Level of Care Assessment Details Patient Name: [name], [name] Date of Service: 02/22/2019 8:30 AM Medical Record Number: [id number] Patient Account Number: [id number] Date of Birth/Sex: 04-17-1946 (73 y.o. F) Treating RN: Huel Coventry Primary Care Valerie Fredin: Terance Hart, DAVID Other Clinician: Referring Kellye Mizner: Terance Hart, DAVID Treating Yahya Boldman/Extender: Linwood Dibbles, HOYT Weeks in Treatment: 6 Clinic Level of Care Assessment Items TOOL 4 Quantity Score []  - Use when only an EandM is performed on FOLLOW-UP visit 0 ASSESSMENTS - Nursing  Assessment / Reassessment []  - Reassessment of Co-morbidities (includes updates in patient status) 0 X- 1 5 Reassessment of Adherence to Treatment Plan ASSESSMENTS - Wound and Skin Assessment / Reassessment X - Simple Wound Assessment / Reassessment - one wound 1 5 []  - 0 Complex Wound Assessment / Reassessment - multiple wounds []  - 0 Dermatologic / Skin Assessment (not related to wound area) ASSESSMENTS - Focused Assessment []  - Circumferential Edema Measurements - multi extremities 0 []  - 0 Nutritional Assessment / Counseling / Intervention []  - 0 Lower Extremity Assessment (monofilament, tuning fork, pulses) []  - 0 Peripheral Arterial Disease Assessment (using hand held doppler) ASSESSMENTS - Ostomy and/or Continence Assessment and Care []  - Incontinence Assessment and Management 0 []  - 0 Ostomy Care Assessment and Management (repouching, etc.) PROCESS - Coordination of Care X - Simple Patient / Family Education for ongoing care 1 15 []  - 0 Complex (extensive) Patient / Family Education for ongoing care []  - 0 Staff obtains Chiropractor, Records, Test Results / Process Orders []  - 0 Staff telephones HHA, Nursing Homes / Clarify orders / etc []  - 0 Routine Transfer to another Facility (non-emergent condition) []  - 0 Routine Hospital Admission (non-emergent condition) []  - 0 New Admissions / Manufacturing engineer / Ordering NPWT, Apligraf, etc. []  - 0 Emergency Hospital Admission (emergent condition) X- 1 10 Simple Discharge Coordination Etsitty, Nikolina L. ([id number]) []  - 0 Complex (extensive) Discharge Coordination PROCESS - Special Needs []  - Pediatric / Minor Patient Management 0 []  - 0 Isolation Patient Management []  - 0 Hearing / Language / Visual special needs []  - 0 Assessment of Community assistance (transportation, D/C planning, etc.) []  - 0 Additional assistance / Altered mentation []  - 0 Support Surface(s) Assessment (bed, cushion, seat,  etc.) INTERVENTIONS - Wound Cleansing / Measurement X - Simple Wound Cleansing - one wound 1 5 []  - 0 Complex Wound Cleansing - multiple wounds X- 1 5 Wound Imaging (photographs -  any number of wounds)  - 0 Wound Tracing (instead of photographs) X- 1 5 Simple Wound Measurement - one wound  - 0 Complex Wound Measurement - multiple wounds INTERVENTIONS - Wound Dressings  - Small Wound Dressing one or multiple wounds 0  - 0 Medium Wound Dressing one or multiple wounds  - 0 Large Wound Dressing one or multiple wounds  - 0 Application of Medications - topical  - 0 Application of Medications - injection INTERVENTIONS - Miscellaneous  - External ear exam 0  - 0 Specimen Collection (cultures, biopsies, blood, body fluids, etc.)  - 0 Specimen(s) / Culture(s) sent or taken to Lab for analysis  - 0 Patient Transfer (multiple staff / Nurse, adult / Similar devices)  - 0 Simple Staple / Suture removal (25 or less)  - 0 Complex Staple / Suture removal (26 or more)  - 0 Hypo / Hyperglycemic Management (close monitor of Blood Glucose)  - 0 Ankle / Brachial Index (ABI) - do not check if billed separately X- 1 5 Vital Signs Fung, Gayla L. ([id number]) Has the patient been seen at the hospital within the last three years: Yes Total Score: 55 Level Of Care: New/Established - Level 2 Electronic Signature(s) Signed: 02/22/2019 4:47:28 PM By: Elliot Gurney, BSN, RN, CWS, Kim RN, BSN Entered By: Elliot Gurney, BSN, RN, CWS, Kim on 02/22/2019 08:54:08 Isola, Caysie L. ([id number]) -------------------------------------------------------------------------------- Encounter Discharge Information Details Patient Name: [name], [name] Date of Service: 02/22/2019 8:30 AM Medical Record Number: [id number] Patient Account Number: [id number] Date of Birth/Sex: 26-Jun-1946 (73 y.o. F) Treating RN: Huel Coventry Primary Care Iriel Nason: Dorothey Baseman Other Clinician: Referring Lamel Mccarley:  Terance Hart DAVID Treating Meril Dray/Extender: Linwood Dibbles, HOYT Weeks in Treatment: 6 Encounter Discharge Information Items Discharge Condition: Stable Ambulatory Status: Ambulatory Discharge Destination: Home Transportation: Private Auto Accompanied By: self Schedule Follow-up Appointment: Yes Clinical Summary of Care: Electronic Signature(s) Signed: 02/22/2019 4:47:28 PM By: Elliot Gurney, BSN, RN, CWS, Kim RN, BSN Entered By: Elliot Gurney, BSN, RN, CWS, Kim on 02/22/2019 08:55:38 Kahl, Lizania L. ([id number]) -------------------------------------------------------------------------------- Lower Extremity Assessment Details Patient Name: Aja, Rosina L. Date of Service: 02/22/2019 8:30 AM Medical Record Number: [id number] Patient Account Number: [id number] Date of Birth/Sex: 06-06-1946 (73 y.o. F) Treating RN: Curtis Sites Primary Care Jeffery Bachmeier: Dorothey Baseman Other Clinician: Referring Shaquanna Lycan: Terance Hart, DAVID Treating Nyal Schachter/Extender: Linwood Dibbles, HOYT Weeks in Treatment: 6 Electronic Signature(s) Signed: 02/22/2019 2:47:29 PM By: Curtis Sites Entered By: Curtis Sites on 02/22/2019 08:47:41 Greenough, Edwin L. ([id number]) -------------------------------------------------------------------------------- Multi Wound Chart Details Patient Name: Stanard, Lashaundra L. Date of Service: 02/22/2019 8:30 AM Medical Record Number: [id number] Patient Account Number: [id number] Date of Birth/Sex: 05-03-1946 (73 y.o. F) Treating RN: Huel Coventry Primary Care Christen Wardrop: Terance Hart, DAVID Other Clinician: Referring Loveah Like: Terance Hart, DAVID Treating Daiana Vitiello/Extender: Linwood Dibbles, HOYT Weeks in Treatment: 6 Vital Signs Height(in): 58 Pulse(bpm): 77 Weight(lbs): 113 Blood Pressure(mmHg): 123/79 Body Mass Index(BMI): 24 Temperature(F): 98.0 Respiratory Rate 16 (breaths/min): Photos: [N/A:N/A] Wound Location: Right Hand - 5th Digit N/A N/A Wounding Event: Trauma N/A N/A Primary Etiology: Trauma, Other N/A  N/A Comorbid History: Hypertension N/A N/A Date Acquired: 01/09/2019 N/A N/A Weeks of Treatment: 6 N/A N/A Wound Status: Open N/A N/A Measurements L x W x D 0x0x0 N/A N/A (cm) Area (cm) : 0 N/A N/A Volume (cm) : 0 N/A N/A % Reduction in Area: 100.00% N/A N/A % Reduction in Volume: 100.00% N/A N/A Classification: Full Thickness Without N/A N/A Exposed Support Structures Exudate Amount: None Present N/A N/A Wound Margin: Thickened N/A N/A Granulation Amount:  None Present (0%) N/A N/A Necrotic Amount: None Present (0%) N/A N/A Exposed Structures: Fascia: No N/A N/A Fat Layer (Subcutaneous Tissue) Exposed: No Tendon: No Muscle: No Joint: No Bone: No Limited to Skin Breakdown Epithelialization: Large (67-100%) N/A N/A Periwound Skin Texture: N/A N/A Trego, Megumi L. ([id number]) Scarring: Yes Excoriation: No Induration: No Callus: No Crepitus: No Rash: No Periwound Skin Moisture: Maceration: No N/A N/A Dry/Scaly: No Periwound Skin Color: Atrophie Blanche: No N/A N/A Cyanosis: No Ecchymosis: No Erythema: No Hemosiderin Staining: No Mottled: No Pallor: No Rubor: No Tenderness on Palpation: No N/A N/A Treatment Notes Electronic Signature(s) Signed: 02/22/2019 3:44:34 PM By: Baltazar Najjar MD Entered By: Baltazar Najjar on 02/22/2019 08:57:24 Baysinger, Zenya L. ([id number]) -------------------------------------------------------------------------------- Multi-Disciplinary Care Plan Details Patient Name: Hollenbeck, Caffie L. Date of Service: 02/22/2019 8:30 AM Medical Record Number: [id number] Patient Account Number: [id number] Date of Birth/Sex: 06/20/1946 (73 y.o. F) Treating RN: Huel Coventry Primary Care Lonn Im: Dorothey Baseman Other Clinician: Referring Yanin Muhlestein: Dorothey Baseman Treating Chatham Howington/Extender: Skeet Simmer in Treatment: 6 Active Inactive Electronic Signature(s) Signed: 02/22/2019 4:47:28 PM By: Elliot Gurney, BSN, RN, CWS, Kim RN, BSN Entered By:  Elliot Gurney, BSN, RN, CWS, Kim on 02/22/2019 08:52:18 Eichhorst, Royetta L. ([id number]) -------------------------------------------------------------------------------- Pain Assessment Details Patient Name: Deneault, Bellina L. Date of Service: 02/22/2019 8:30 AM Medical Record Number: [id number] Patient Account Number: [id number] Date of Birth/Sex: 1946-10-27 (73 y.o. F) Treating RN: Curtis Sites Primary Care Trae Bovenzi: Dorothey Baseman Other Clinician: Referring Finnigan Warriner: Terance Hart, DAVID Treating Kierre Deines/Extender: Linwood Dibbles, HOYT Weeks in Treatment: 6 Active Problems Location of Pain Severity and Description of Pain Patient Has Paino No Site Locations Pain Management and Medication Current Pain Management: Electronic Signature(s) Signed: 02/22/2019 2:47:29 PM By: Curtis Sites Entered By: Curtis Sites on 02/22/2019 08:43:02 Schlottman, Maycie L. ([id number]) -------------------------------------------------------------------------------- Patient/Caregiver Education Details Patient Name: Hudson, Dalaysia L. Date of Service: 02/22/2019 8:30 AM Medical Record Number: [id number] Patient Account Number: [id number] Date of Birth/Gender: 27-May-1946 (73 y.o. F) Treating RN: Huel Coventry Primary Care Physician: Dorothey Baseman Other Clinician: Referring Physician: Dorothey Baseman Treating Physician/Extender: Skeet Simmer in Treatment: 6 Education Assessment Education Provided To: Patient Education Topics Provided Safety: Handouts: Safety Methods: Demonstration, Explain/Verbal Responses: State content correctly Electronic Signature(s) Signed: 02/22/2019 4:47:28 PM By: Elliot Gurney, BSN, RN, CWS, Kim RN, BSN Entered By: Elliot Gurney, BSN, RN, CWS, Kim on 02/22/2019 08:55:16 Weaver, Kymari L. ([id number]) -------------------------------------------------------------------------------- Wound Assessment Details Patient Name: Pequignot, Jacee L. Date of Service: 02/22/2019 8:30 AM Medical Record Number:  [id number] Patient Account Number: [id number] Date of Birth/Sex: [birth year] (73 y.o. F) Treating RN: Curtis Sites Primary Care Nitish Roes: Terance Hart, DAVID Other Clinician: Referring Jeree Delcid: Terance Hart, DAVID Treating Tellis Spivak/Extender: Linwood Dibbles, HOYT Weeks in Treatment: 6 Wound Status Wound Number: 1 Primary Etiology: Trauma, Other Wound Location: Right Hand - 5th Digit Wound Status: Open Wounding Event: Trauma Comorbid History: Hypertension Date Acquired: 01/09/2019 Weeks Of Treatment: 6 Clustered Wound: No Photos Wound Measurements Length: (cm) 0 % Reducti Width: (cm) 0 % Reducti Depth: (cm) 0 Epithelia Area: (cm) 0 Tunnelin Volume: (cm) 0 Undermin on in Area: 100% on in Volume: 100% lization: Large (67-100%) g: No ing: No Wound Description Full Thickness Without Exposed Support Foul Odor Classification: Structures Slough/Fi Wound Margin: Thickened Exudate None Present Amount: After Cleansing: No brino No Wound Bed Granulation Amount: None Present (0%) Exposed Structure Necrotic Amount: None Present (0%) Fascia Exposed: No Fat Layer (Subcutaneous Tissue) Exposed: No Tendon Exposed: No Muscle Exposed: No Joint Exposed: No Bone Exposed: No Limited to Skin Breakdown  Periwound Skin Texture Kopp, Minola L. ([id number]) Texture Color No Abnormalities Noted: No No Abnormalities Noted: No Callus: No Atrophie Blanche: No Crepitus: No Cyanosis: No Excoriation: No Ecchymosis: No Induration: No Erythema: No Rash: No Hemosiderin Staining: No Scarring: Yes Mottled: No Pallor: No Moisture Rubor: No No Abnormalities Noted: No Dry / Scaly: No Maceration: No Electronic Signature(s) Signed: 02/22/2019 2:47:29 PM By: Curtis Sitesorthy, Joanna Entered By: Curtis Sitesorthy, Joanna on 02/22/2019 08:47:06 Ciriello, Shavaun L. ([id number]) -------------------------------------------------------------------------------- Vitals Details Patient Name: Korn, Zeniya L. Date of Service:  02/22/2019 8:30 AM Medical Record Number: [id number] Patient Account Number: [id number] Date of Birth/Sex: 04/09/1946 15(72 y.o. F) Treating RN: Curtis Sitesorthy, Joanna Primary Care Zimal Weisensel: Terance HartBRONSTEIN, DAVID Other Clinician: Referring Keonta Alsip: Terance HartBRONSTEIN, DAVID Treating Arihant Pennings/Extender: Linwood DibblesSTONE III, HOYT Weeks in Treatment: 6 Vital Signs Time Taken: 08:43 Temperature (F): 98.0 Height (in): 58 Pulse (bpm): 77 Weight (lbs): 113 Respiratory Rate (breaths/min): 16 Body Mass Index (BMI): 23.6 Blood Pressure (mmHg): 123/79 Reference Range: 80 - 120 mg / dl Electronic Signature(s) Signed: 02/22/2019 2:47:29 PM By: Curtis Sitesorthy, Joanna Entered By: Curtis Sitesorthy, Joanna on 02/22/2019 08:44:59

## 2019-02-24 NOTE — Progress Notes (Signed)
Cipriani, Hasel L. ([id number]) Visit Report for 02/22/2019 HPI Details Patient Name: [name], [name] Date of Service: 02/22/2019 8:30 AM Medical Record Number: [id number] Patient Account Number: [id number] Date of Birth/Sex: 04-08-1946 (73 y.o. 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 Her hand was gloved. She had an avulsion injury from the mid right middle phalanx all the way up and including her nailbed to the tip of her finger. There was tissue hanging off she actually remove this. She presented to the ER on 01/09/2019 after this happened. An x-ray was negative. There was no tissue for suturing. She had Bactrim and Keflex provided. The wound was dressed with surgie gel, Xeroform and Coban. She presents here for follow-up. Past medical history includes hyperlipidemia and hypertension 3/4; traumatic area on her right fifth dorsal finger. Measurements are slightly better although surface has tightly adherent debris. I did simply use topical antibiotics last week but will change her to silver alginate this week. 3/11; traumatic area on the right dorsal fifth finger involving the nailbed. We have been using silver alginate. Nice progression today. 3/25; patient arrives with tight surface eschar over the majority of the wound. She has been using silver alginate 4/8; patient's right fifth finger is totally epithelialized including the nail bed Electronic Signature(s) Signed: 02/22/2019 3:44:34 PM By: Baltazar Najjar MD Entered By: Baltazar Najjar on 02/22/2019 08:57:52 [name], [name]  ([id number]) -------------------------------------------------------------------------------- Physical Exam Details Patient Name: [name], [name] Date of Service: 02/22/2019 8:30 AM Medical Record Number: [id number] Patient Account Number: [id number] Date of Birth/Sex: Mar 18, 1946 (73 y.o. F) Treating RN: Huel Coventry Primary Care Provider: Dorothey Baseman Other Clinician: Referring Provider: Terance Hart, DAVID Treating Provider/Extender: Altamese [place] in Treatment: 6 Constitutional Sitting or standing Blood Pressure is within target range for patient.. Pulse regular and within target range for patient.Marland Kitchen Respirations regular, non-labored and within target range.. Temperature is normal and within the target range for the patient.Marland Kitchen appears in no distress. Musculoskeletal The DIP joint is nontender. Integumentary (Hair, Skin) No erythema. Notes Wound exam; the patient arrived with the area totally healed on the right dorsal fifth finger. Even the nailbed is completely epithelialized. There is no evidence of infection some scar tissue. Electronic Signature(s) Signed: 02/22/2019 3:44:34 PM By: Baltazar Najjar MD Entered By: Baltazar Najjar on 02/22/2019 08:59:14 [name], [name] ([id number]) -------------------------------------------------------------------------------- Physician Orders Details Patient Name: [name], [name] Date of Service: 02/22/2019 8:30 AM Medical Record Number: [id number] Patient Account Number: [id number] Date of Birth/Sex: 12-12-1945 (73 y.o. F) Treating RN: Huel Coventry Primary Care Provider: Dorothey Baseman Other Clinician: Referring Provider: Terance Hart DAVID Treating Provider/Extender: Skeet [place] in Treatment: 6 Verbal / Phone Orders: No Diagnosis Coding Discharge From [facility] Services o Discharge from Wound Care Center - Treatment Complete Electronic Signature(s) Signed: 02/22/2019 4:47:28 PM By: Elliot Gurney, BSN, RN, CWS, Kim RN, BSN Signed: 02/24/2019  12:23:18 PM By: Lenda Kelp PA-C Entered By: Elliot Gurney, BSN, RN, CWS, Kim on 02/22/2019 08:53:38 [name], [name] ([id number]) -------------------------------------------------------------------------------- Problem List Details Patient Name: [name], [name] Date of Service: 02/22/2019 8:30 AM Medical Record Number: [id number] Patient Account Number: [id number] Date of Birth/Sex: 1946/09/05 (73 y.o. 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 ([id number]) -------------------------------------------------------------------------------- Progress Note Details Patient Name: [name], [name] Date of Service: 02/22/2019 8:30 AM Medical Record Number: [id number] Patient Account Number: [id number] Date of Birth/Sex: 1946-08-31 (73 y.o. 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 Her hand was gloved. She had an avulsion injury from the mid right middle phalanx all the way up and including her nailbed to the tip of her finger.  There was tissue hanging off she actually remove this. She presented to the ER on 01/09/2019 after this happened. An x-ray was negative. There was no tissue for suturing. She had Bactrim and Keflex provided. The wound was dressed with surgie gel, Xeroform and Coban. She presents here for follow-up. Past medical history includes hyperlipidemia and hypertension 3/4; traumatic area on her right fifth dorsal finger. Measurements are slightly better although surface has tightly adherent debris. I did simply use topical antibiotics last week but will change her to silver alginate this week. 3/11; traumatic area on the right dorsal fifth finger involving the nailbed. We have been using silver alginate. Nice progression today. 3/25; patient arrives with tight surface eschar over the majority of the wound. She has been using silver alginate 4/8; patient's right fifth finger is totally epithelialized including the nail bed Objective Constitutional Sitting or standing Blood Pressure is within target range for patient.. Pulse regular and within target range for patient.Marland Kitchen Respirations regular, non-labored and within target range.. Temperature is normal and within the target range for the patient.Marland Kitchen appears in no distress. Vitals Time Taken: 8:43 AM, Height: 58 in, Weight: 113 lbs, BMI: 23.6, Temperature: 98.0 F, Pulse: 77 bpm, Respiratory Rate: 16 breaths/min, Blood Pressure: 123/79 mmHg. Musculoskeletal The DIP joint is nontender. General Notes: Wound exam; the patient arrived with the area totally healed on the right dorsal fifth finger. Even the nailbed is completely epithelialized. There is no evidence of infection some scar tissue. Integumentary (Hair, Skin) No erythema. [name], [name] ([id number]) Wound #1 status is Open. Original cause of wound was Trauma. The wound is located on the Right Hand - 5th Digit. The wound measures 0cm length x 0cm width x 0cm depth; 0cm^2 area and 0cm^3 volume. The  wound is limited to skin breakdown. There is no tunneling or undermining noted. There is a none present amount of drainage noted. The wound margin is thickened. There is no granulation within the wound bed. There is no necrotic tissue within the wound bed. The periwound skin appearance exhibited: Scarring. The periwound skin appearance did not exhibit: Callus, Crepitus, Excoriation, Induration, Rash, Dry/Scaly, Maceration, Atrophie Blanche, Cyanosis, Ecchymosis, Hemosiderin Staining, Mottled, Pallor, Rubor, Erythema. Assessment Active Problems ICD-10 Laceration without foreign body of right hand, subsequent encounter Non-pressure chronic ulcer of skin  of other sites with other specified severity Plan [facility] Discharge from Wound Care Center - Treatment Complete 1. The patient can be discharged from the wound care center 2. This was initially traumatic, no secondary preventive measures are felt to be necessary Electronic Signature(s) Signed: 02/22/2019 3:44:34 PM By: Baltazar Najjarobson,  MD Entered By: Baltazar Najjarobson,  on 02/22/2019 08:59:46 [name], [name] ([id number]) -------------------------------------------------------------------------------- SuperBill Details Patient Name: [name], [name] Date of Service: 02/22/2019 Medical Record Number: [id number] Patient Account Number: [id number] Date of Birth/Sex: November 27, 1945 [age] y.o. F) Treating RN: Huel CoventryWoody, Kim Primary Care Provider: Dorothey BasemanBRONSTEIN, DAVID Other Clinician: Referring Provider: Terance HartBRONSTEIN, DAVID Treating Provider/Extender: Altamese [place],  G Weeks in Treatment: 6 Diagnosis Coding ICD-10 Codes Code Description [phone] Laceration without foreign body of right hand, subsequent encounter L98.498 Non-pressure chronic ulcer of skin of other sites with other specified severity Facility Procedures CPT4 Code: [id number] Description: [id number] - WOUND CARE VISIT-LEV 2 EST PT Modifier: Quantity: 1 Physician Procedures CPT4 Code  Description: [id number] - WC PHYS LEVEL 2 - EST PT ICD-10 Diagnosis Description S61.411D Laceration without foreign body of right hand, subsequent encount L98.498 Non-pressure chronic ulcer of skin of other sites with other spec Modifier: er ified severity Quantity: 1 Electronic Signature(s) Signed: 02/22/2019 3:44:34 PM By: Baltazar Najjarobson,  MD Entered By: Baltazar Najjarobson,  on 02/22/2019 09:00:05

## 2020-02-01 ENCOUNTER — Other Ambulatory Visit: Payer: Self-pay | Admitting: Family Medicine

## 2020-02-01 DIAGNOSIS — Z1231 Encounter for screening mammogram for malignant neoplasm of breast: Secondary | ICD-10-CM

## 2020-02-21 ENCOUNTER — Ambulatory Visit
Admission: RE | Admit: 2020-02-21 | Discharge: 2020-02-21 | Disposition: A | Discharge status: Home / Self Care | Payer: Medicare PPO | Source: Ambulatory Visit | Attending: Family Medicine | Admitting: Family Medicine

## 2020-02-21 DIAGNOSIS — Z1231 Encounter for screening mammogram for malignant neoplasm of breast: Secondary | ICD-10-CM | POA: Diagnosis not present

## 2021-03-14 ENCOUNTER — Inpatient Hospital Stay
Admission: EM | Admit: 2021-03-14 | Discharge: 2021-03-19 | DRG: 280 | Disposition: A | Discharge status: Home Health | Payer: Medicare PPO | Attending: Internal Medicine | Admitting: Internal Medicine

## 2021-03-14 ENCOUNTER — Other Ambulatory Visit: Payer: Self-pay

## 2021-03-14 ENCOUNTER — Emergency Department: Payer: Medicare PPO

## 2021-03-14 DIAGNOSIS — I6340 Cerebral infarction due to embolism of unspecified cerebral artery: Secondary | ICD-10-CM | POA: Diagnosis not present

## 2021-03-14 DIAGNOSIS — M1990 Unspecified osteoarthritis, unspecified site: Secondary | ICD-10-CM | POA: Diagnosis present

## 2021-03-14 DIAGNOSIS — I480 Paroxysmal atrial fibrillation: Secondary | ICD-10-CM | POA: Diagnosis present

## 2021-03-14 DIAGNOSIS — I2510 Atherosclerotic heart disease of native coronary artery without angina pectoris: Secondary | ICD-10-CM | POA: Diagnosis present

## 2021-03-14 DIAGNOSIS — E876 Hypokalemia: Secondary | ICD-10-CM

## 2021-03-14 DIAGNOSIS — R918 Other nonspecific abnormal finding of lung field: Secondary | ICD-10-CM | POA: Diagnosis not present

## 2021-03-14 DIAGNOSIS — Z79899 Other long term (current) drug therapy: Secondary | ICD-10-CM

## 2021-03-14 DIAGNOSIS — R29700 NIHSS score 0: Secondary | ICD-10-CM | POA: Diagnosis present

## 2021-03-14 DIAGNOSIS — I214 Non-ST elevation (NSTEMI) myocardial infarction: Secondary | ICD-10-CM | POA: Diagnosis present

## 2021-03-14 DIAGNOSIS — Z20822 Contact with and (suspected) exposure to covid-19: Secondary | ICD-10-CM | POA: Diagnosis present

## 2021-03-14 DIAGNOSIS — R55 Syncope and collapse: Secondary | ICD-10-CM | POA: Diagnosis present

## 2021-03-14 DIAGNOSIS — I10 Essential (primary) hypertension: Secondary | ICD-10-CM | POA: Diagnosis present

## 2021-03-14 DIAGNOSIS — F05 Delirium due to known physiological condition: Secondary | ICD-10-CM | POA: Diagnosis not present

## 2021-03-14 DIAGNOSIS — Z7982 Long term (current) use of aspirin: Secondary | ICD-10-CM | POA: Diagnosis not present

## 2021-03-14 DIAGNOSIS — T502X5A Adverse effect of carbonic-anhydrase inhibitors, benzothiadiazides and other diuretics, initial encounter: Secondary | ICD-10-CM | POA: Diagnosis not present

## 2021-03-14 DIAGNOSIS — E785 Hyperlipidemia, unspecified: Secondary | ICD-10-CM

## 2021-03-14 DIAGNOSIS — J690 Pneumonitis due to inhalation of food and vomit: Secondary | ICD-10-CM | POA: Diagnosis not present

## 2021-03-14 DIAGNOSIS — E7800 Pure hypercholesterolemia, unspecified: Secondary | ICD-10-CM | POA: Diagnosis not present

## 2021-03-14 DIAGNOSIS — I639 Cerebral infarction, unspecified: Secondary | ICD-10-CM | POA: Diagnosis not present

## 2021-03-14 LAB — TROPONIN I (HIGH SENSITIVITY)
Troponin I (High Sensitivity): 996 ng/L (ref <18)
Troponin I (High Sensitivity): 797 ng/L (ref <18)

## 2021-03-14 LAB — CBC
HCT: 34.1 % — ABNORMAL LOW (ref 36.0–46.0)
Hemoglobin: 11.3 g/dL — ABNORMAL LOW (ref 12.0–15.0)
MCH: 30.5 pg (ref 26.0–34.0)
MCHC: 33.1 g/dL (ref 30.0–36.0)
MCV: 92.2 fL (ref 80.0–100.0)
Platelets: 272 10*3/uL (ref 150–400)
RBC: 3.7 MIL/uL — ABNORMAL LOW (ref 3.87–5.11)
RDW: 12.6 % (ref 11.5–15.5)
WBC: 10.8 10*3/uL — ABNORMAL HIGH (ref 4.0–10.5)
nRBC: 0 % (ref 0.0–0.2)

## 2021-03-14 LAB — PROTIME-INR
INR: 1 (ref 0.8–1.2)
Prothrombin Time: 13.6 seconds (ref 11.4–15.2)

## 2021-03-14 LAB — COMPREHENSIVE METABOLIC PANEL
ALT: 44 U/L (ref 0–44)
AST: 62 U/L — ABNORMAL HIGH (ref 15–41)
Albumin: 4.1 g/dL (ref 3.5–5.0)
Alkaline Phosphatase: 80 U/L (ref 38–126)
Anion gap: 12 (ref 5–15)
BUN: 21 mg/dL (ref 8–23)
CO2: 22 mmol/L (ref 22–32)
Calcium: 9.3 mg/dL (ref 8.9–10.3)
Chloride: 103 mmol/L (ref 98–111)
Creatinine, Ser: 1.02 mg/dL — ABNORMAL HIGH (ref 0.44–1.00)
GFR, Estimated: 57 mL/min — ABNORMAL LOW (ref >60)
Glucose, Bld: 182 mg/dL — ABNORMAL HIGH (ref 70–99)
Potassium: 2.8 mmol/L — ABNORMAL LOW (ref 3.5–5.1)
Sodium: 137 mmol/L (ref 135–145)
Total Bilirubin: 0.9 mg/dL (ref 0.3–1.2)
Total Protein: 6.9 g/dL (ref 6.5–8.1)

## 2021-03-14 LAB — APTT: aPTT: 24 seconds (ref 24–36)

## 2021-03-14 LAB — MAGNESIUM: Magnesium: 2.1 mg/dL (ref 1.7–2.4)

## 2021-03-14 LAB — RESP PANEL BY RT-PCR (FLU A&B, COVID) ARPGX2
Influenza A by PCR: NEGATIVE
Influenza B by PCR: NEGATIVE
SARS Coronavirus 2 by RT PCR: NEGATIVE

## 2021-03-14 MED ORDER — ACETAMINOPHEN 650 MG RE SUPP: 650.0000 mg | Freq: Four times a day (QID) | RECTAL | Status: DC | PRN

## 2021-03-14 MED ORDER — METOPROLOL TARTRATE 25 MG PO TABS
25.0000 mg | ORAL_TABLET | Freq: Two times a day (BID) | ORAL | Status: DC
  Administered 2021-03-14 – 2021-03-19 (×9): 25 mg via ORAL
  Filled 2021-03-14 (×9): qty 1

## 2021-03-14 MED ORDER — ONDANSETRON HCL 4 MG/2ML IJ SOLN
4.0000 mg | Freq: Once | INTRAMUSCULAR | Status: AC
  Administered 2021-03-14: 4 mg via INTRAVENOUS
  Filled 2021-03-14: qty 2

## 2021-03-14 MED ORDER — ATORVASTATIN CALCIUM 80 MG PO TABS
80.0000 mg | ORAL_TABLET | Freq: Every day | ORAL | Status: DC
  Administered 2021-03-14 – 2021-03-19 (×5): 80 mg via ORAL
  Filled 2021-03-14 (×2): qty 4
  Filled 2021-03-14 (×3): qty 1

## 2021-03-14 MED ORDER — POTASSIUM CHLORIDE 10 MEQ/100ML IV SOLN
10.0000 meq | Freq: Once | INTRAVENOUS | Status: AC
  Administered 2021-03-14: 10 meq via INTRAVENOUS
  Filled 2021-03-14: qty 100

## 2021-03-14 MED ORDER — SODIUM CHLORIDE 0.9 % IV BOLUS
1000.0000 mL | Freq: Once | INTRAVENOUS | Status: AC
  Administered 2021-03-14: 1000 mL via INTRAVENOUS

## 2021-03-14 MED ORDER — ACETAMINOPHEN 325 MG PO TABS
650.0000 mg | ORAL_TABLET | Freq: Four times a day (QID) | ORAL | Status: DC | PRN
  Filled 2021-03-14 (×2): qty 2

## 2021-03-14 MED ORDER — HEPARIN (PORCINE) 25000 UT/250ML-% IV SOLN
900.0000 [IU]/h | INTRAVENOUS | Status: DC
  Administered 2021-03-14 – 2021-03-16 (×3): 700 [IU]/h via INTRAVENOUS
  Filled 2021-03-14 (×4): qty 250

## 2021-03-14 MED ORDER — HEPARIN BOLUS VIA INFUSION
3450.0000 [IU] | Freq: Once | INTRAVENOUS | Status: AC
  Administered 2021-03-14: 3450 [IU] via INTRAVENOUS
  Filled 2021-03-14: qty 3450

## 2021-03-14 NOTE — ED Notes (Signed)
Report given to [facility] An, Charity fundraiser.

## 2021-03-14 NOTE — H&P (Signed)
History and Physical    PLEASE NOTE THAT DRAGON DICTATION SOFTWARE WAS USED IN THE CONSTRUCTION OF THIS NOTE.   Victoria [name] MBW:[id number] DOB: 02/01/1946 DOA: 03/14/2021  PCP: Dorothey Baseman, MD Patient coming from: home   I have personally briefly reviewed patient's old medical records in [facility]  Chief Complaint: syncope  HPI: Victoria [name] is a 75 y.o. female with medical history significant for hypertension, hyperlipidemia who is admitted to [facility] on 03/14/2021 with NSTEMI after presenting from home to [facility] ED for evaluation of syncope.   The patient reportedly was at home earlier this afternoon when she leaned forward to pick up something located on her near the floor at which time she experienced consciousness that was witnessed by her family, who reported that the patient fell straight forward, hitting her head on the floor.  At that time, she was completely unconscious and nonresponsive per her family, who did not reportedly initiated 1 to 2 minutes of CPR while contacting EMS.  Socially, patient brought to [facility] ED via EMS, and received a full dose aspirin in route.  Provide discussions with the patient, she denies any prior syncopal event similar to the above.  She denies any prodrome leading up to her loss of consciousness.  She reports that she was in her baseline state of health today leading up to the syncopal event.  She denies any recent or subsequent chest pain.  Additionally she denies any recent shortness of breath, palpitations, diaphoresis, nausea, vomiting.  No preceding trauma.  Denies any recent wheezing, cough, hemoptysis, lower extremity erythema, Jeffory Snelgrove new onset calf tenderness.  She also denies any recent orthopnea, PND, or peripheral edema.  No subjective fever, chills, rigors, or generalized myalgias.  No recent travel.  She denies any known history of underlying coronary artery disease.  Acknowledges a history of  hypertension for which she reports good compliance with her outpatient losartan and HCTZ.  Also acknowledges a history of hyperlipidemia that she reports is managed via lifestyle modifications, and is not currently on a statin medication at home.  Denies any known history of underlying diabetes, and reports that she is a lifelong non-smoker.  No known family history of premature coronary artery disease.    ED Course:  Vital signs in the ED were notable for the following:  Tetramex 97.1, initial heart rate 118, which is decreased to 63-72; blood pressure 117/71-140/87; respiratory rate 17-21; oxygen saturation 96 to 100% on room air.  Labs were notable for the following: CMP was notable for the following: Sodium 137, potassium 2.8, bicarbonate 22, creatinine 1.02, glucose 182.  Magnesium 2.1.  Initial high-sensitivity troponin I found to be 996, with repeat value trending down to 797.  CBC notable for white blood cell count of 10,800, hemoglobin 11.3.  INR 1.0.  Nasopharyngeal COVID-19 PCR was checked in the ED today, and found to be negative.  EKG demonstrated sinus rhythm with ST depression in the inferior leads, which subsequently resolved with serial EKGs, with the final of which demonstrating sinus rhythm with no evidence of T wave or ST changes.  Chest x-ray showed no evidence of acute cardiopulmonary process, including no evidence of infiltrate, edema, effusion, or pneumothorax.  Noncontrast CT of the head showed no evidence of acute intracranial process, including no evidence of hemorrhage or acute ischemic infarct.  EDP discussed the patient's case, elevated troponin values, and initial EKG changes with the on-call cardiologist, Dr. Darrold Junker, who agreed to formally consult  and has evaluated the patient in the ED. Dr. Darrold Junker feels that this presentation is most consistent with NSTEMI, and recommends overnight medical management in the form of heparin drip, Lopressor, high intensity  atorvastatin.  He also recommends echocardiogram.  While in the ED, the following were administered: Heparin drip initiated, normal saline x1 L bolus, potassium chloride 10 mEq IV x1.  Socially, the patient was admitted to the PCU for further evaluation and management of presenting NSTEMI in the setting episode of syncope.     Review of Systems: As per HPI otherwise 10 point review of systems negative.   Past Medical History:  Diagnosis Date  . Arthritis   . Headache   . Hyperlipidemia   . Hypertension     Past Surgical History:  Procedure Laterality Date  . COLONOSCOPY WITH PROPOFOL N/A 09/26/2015   Procedure: COLONOSCOPY WITH PROPOFOL;  Surgeon: Wallace Cullens, MD;  Location: [facility] ENDOSCOPY;  Service: Gastroenterology;  Laterality: N/A;  . NO PAST SURGERIES    . TUBAL LIGATION      Social History:  reports that she has never smoked. She has never used smokeless tobacco. She reports that she does not drink alcohol and does not use drugs.   No Known Allergies  Family History  Problem Relation Age of Onset  . Breast cancer Neg Hx       Prior to Admission medications   Medication Sig Start Date End Date Taking? Authorizing Provider  aspirin 325 MG tablet Take 325 mg by mouth daily.    [provider]  b complex vitamins capsule Take 1 capsule by mouth daily.    [provider]  valsartan-hydrochlorothiazide (DIOVAN-HCT) 320-25 MG tablet Take 1 tablet by mouth daily.    [provider]     Objective    Physical Exam: Vitals:   03/14/21 1530 03/14/21 1551 03/14/21 1600 03/14/21 1700  BP: 130/82 125/73 117/71 125/78  Pulse: 95 94 93 87  Resp: 15 12 18 17   Temp:      TempSrc:      SpO2: 97% 97% 97% 97%  Weight:    57.4 kg  Height:    4\' 10"  (1.473 m)    General: appears to be stated age; alert, oriented Skin: warm, dry, no rash Head:  AT/[place] Mouth:  Oral mucosa membranes appear moist, normal dentition Neck: supple; trachea  midline Heart:  RRR; did not appreciate any M/R/G Lungs: CTAB, did not appreciate any wheezes, rales, or rhonchi Abdomen: + BS; soft, ND, NT Vascular: 2+ pedal pulses b/l; 2+ radial pulses b/l Extremities: no peripheral edema, no muscle wasting Neuro: strength and sensation intact in upper and lower extremities b/l     Labs on Admission: I have personally reviewed following labs and imaging studies  CBC: Recent Labs  Lab 03/14/21 1531  WBC 10.8*  HGB 11.3*  HCT 34.1*  MCV 92.2  PLT 272   Basic Metabolic Panel: Recent Labs  Lab 03/14/21 1531  NA 137  K 2.8*  CL 103  CO2 22  GLUCOSE 182*  BUN 21  CREATININE 1.02*  CALCIUM 9.3   GFR: Estimated Creatinine Clearance: 35.7 mL/min (A) (by C-G formula based on SCr of 1.02 mg/dL (H)). Liver Function Tests: Recent Labs  Lab 03/14/21 1531  AST 62*  ALT 44  ALKPHOS 80  BILITOT 0.9  PROT 6.9  ALBUMIN 4.1   No results for input(s): LIPASE, AMYLASE in the last 168 hours. No results for input(s): AMMONIA in the  last 168 hours. Coagulation Profile: Recent Labs  Lab 03/14/21 1753  INR 1.0   Cardiac Enzymes: No results for input(s): CKTOTAL, CKMB, CKMBINDEX, TROPONINI in the last 168 hours. BNP (last 3 results) No results for input(s): PROBNP in the last 8760 hours. HbA1C: No results for input(s): HGBA1C in the last 72 hours. CBG: No results for input(s): GLUCAP in the last 168 hours. Lipid Profile: No results for input(s): CHOL, HDL, LDLCALC, TRIG, CHOLHDL, LDLDIRECT in the last 72 hours. Thyroid Function Tests: No results for input(s): TSH, T4TOTAL, FREET4, T3FREE, THYROIDAB in the last 72 hours. Anemia Panel: No results for input(s): VITAMINB12, FOLATE, FERRITIN, TIBC, IRON, RETICCTPCT in the last 72 hours. Urine analysis: No results found for: COLORURINE, APPEARANCEUR, LABSPEC, PHURINE, GLUCOSEU, HGBUR, BILIRUBINUR, KETONESUR, PROTEINUR, UROBILINOGEN, NITRITE, LEUKOCYTESUR  Radiological Exams on  Admission: CT Head Wo Contrast  Result Date: 03/14/2021 CLINICAL DATA:  Syncopal episode EXAM: CT HEAD WITHOUT CONTRAST TECHNIQUE: Contiguous axial images were obtained from the base of the skull through the vertex without intravenous contrast. COMPARISON:  None. FINDINGS: Brain: Ventricles are normal in size. No mass, hemorrhage, edema or other evidence of acute parenchymal abnormality. No extra-axial hemorrhage. Vascular: Chronic calcified atherosclerotic changes of the large vessels at the skull base. No unexpected hyperdense vessel. Skull: Normal. Negative for fracture or focal lesion. Sinuses/Orbits: No acute finding. Other: None. IMPRESSION: Negative head CT. No intracranial mass, hemorrhage or edema. Electronically Signed   By: Bary RichardStan  Maynard M.D.   On: 03/14/2021 16:19   DG Chest Portable 1 View  Result Date: 03/14/2021 CLINICAL DATA:  Syncopal episode EXAM: PORTABLE CHEST 1 VIEW COMPARISON:  None. FINDINGS: Heart size is normal. There is atherosclerotic calcification of the aorta. Pulmonary vascularity is normal. The lungs are clear. No acute regional bone finding. IMPRESSION: No active disease. Aortic atherosclerosis. Electronically Signed   By: Paulina FusiMark  Shogry M.D.   On: 03/14/2021 17:34     EKG: Independently reviewed, with result as described above.    Assessment/Plan   Victoria [name] is a 75 y.o. female with medical history significant for hypertension, hyperlipidemia who is admitted to [facility] on 03/14/2021 with NSTEMI after [facility] ED for evaluation of syncope.    Principal Problem:   NSTEMI (non-ST elevated myocardial infarction) (HCC) Active Problems:   Syncope   Hypokalemia   Hypertension   Hyperlipidemia    #) NSTEMI: Suspected diagnosis on the basis of presenting syncope without preceding prodrome, with elevated high-sensitivity troponin I, initial dynamic EKG changes that included ST depression in the inferior leads that  subsequently resolved with repeat EKGs, but no evidence of ST elevation multiple consecutive leads. with the on-call cardiologist, Dr. Darrold JunkerParaschos, who agreed to formally consult and has evaluated the patient in the ED. Dr. Darrold JunkerParaschos feels that this presentation is most consistent with NSTEMI, and recommends overnight medical management in the form of heparin drip, Lopressor, high intensity atorvastatin.  He also recommends echocardiogram.  Presentation has not been associated with any chest pain, and overall, with the exception of syncope, the patient reports that she has remained asymptomatic throughout the day.  No known history of underlying coronary disease, although she does possess multiple risk factors that include hypertension, hyperlipidemia, and age.  Full dose aspirin reportedly administered in route to the ED, as above.  Plan: Cardiology formally consulted, as above, with ensuing recommendations for medical management.  Continue heparin drip.  Start Lopressor and high intensity atorvastatin per cardiology consult.  Echocardiogram is  been ordered.  Monitor on telemetry.  Monitor continuous pulse oximetry.  Add on serum magnesium level.  Supplementation of presenting hypokalemia, as further detailed below.      #) Syncope: 1 episode of ported syncope earlier today, witnessed by family, and occurring in the absence of any reported prodrome, thereby increasing likelihood of underlying ventricular arrhythmia as contributing factor.  In the absence of prodrome, appears less suggestive of vasovagal syncope orthostatic hypotension.  No evidence of seizure-like activity.  Additionally, presentation is clinically much less suggestive of acute stroke, and presenting CT head showed no evidence of acute intracranial process.  We will also supplement presenting hypokalemia, as a risk factor for ventricular arrhythmia.    Plan: Work-up and management of presenting NSTEMI including medical management that  includes heparin drip, beta-blocker, high intensity atorvastatin.  Check echocardiogram per cardiology recommendation.  Monitor on telemetry.  Monitor continuous pulse oximetry.  Add on serum magnesium level.  Evaluation and management of presenting hypokalemia, including supplementation efforts, as further described below.     #) Hypokalemia: Presenting serum potassium noted to be 2.8.  In the setting of suspected presenting NSTEMI, as well as syncope without prodrome, will provide supplementation and closely monitor ensuing serum potassium level, as further described below.  Of note, the patient received 10 mEq of IV potassium chloride in the ED.  Plan: Potassium chloride 40 mEq IV over 4 hours x 1 now.  Repeat BMP following completion of this.  Add on serum magnesium level, with plan to provide as needed supplementation in order to maintain magnesium levels of greater than or equal to 2.0.  Monitor on telemetry.      #) Essential hypertension: Outpatient and hypertensive regimen consists of losartan and HCTZ.  Systolic blood pressures in the ED have been in the range of the 120s to 140s, without evidence of hypotension.  Initial cardiac recommendations did not include that for ACE-I or ARB.   Plan: Hold home losartan and HCTZ for now.  Close monitoring of ensuing blood pressure via routine vital signs.      #) Hyperlipidemia: Appears to have been managed via lifestyle modifications as an outpatient, in the absence of any lipid-lowering medications at home, including no statin.  However, in the setting of suspected presenting NSTEMI, high intensity atorvastatin has been initiated.  Plan: Continue daily high intensity atorvastatin that was initiated today.      DVT prophylaxis: On heparin drip Code Status: Full code Family Communication: none Disposition Plan: Per Rounding Team Consults called: Case discussed with on-call cardiologist, Dr. Darrold Junker, will formally consult and seen  the patient, as further detailed above. Admission status: Inpatient; PCU     Of note, this patient was added by me to the following Admit List/Treatment Team: armcadmits.      PLEASE NOTE THAT DRAGON DICTATION SOFTWARE WAS USED IN THE CONSTRUCTION OF THIS NOTE.   Angie Fava DO [facility] Pager [phone] From 12PM - 12AM  Otherwise, please contact night-coverage  [email or web address] Password [email or web address]   03/14/2021, 7:00 PM

## 2021-03-14 NOTE — ED Notes (Signed)
Pt gave verbal permission for RN to talk with Deniece Portela (pt son). Son updated on pt status and plan of care. Pt states she is dizzy, MD Paduchowski made aware via secure chat. AO x4

## 2021-03-14 NOTE — ED Notes (Signed)
Cardiology at bedside.

## 2021-03-14 NOTE — Consult Note (Signed)
ANTICOAGULATION CONSULT NOTE - Initial Consult  Pharmacy Consult for heparin infusion Indication: chest pain/ACS  No Known Allergies  Patient Measurements: Height: 4\' 10"  (147.3 cm) Weight: 57.4 kg (126 lb 8.7 oz) IBW/kg (Calculated) : 40.9 Heparin Dosing Weight: 57.4 kg  Vital Signs: Temp: 97.1 F (36.2 C) (04/29 1517) Temp Source: Oral (04/29 1517) BP: 125/78 (04/29 1700) Pulse Rate: 87 (04/29 1700)  Labs: Recent Labs    03/14/21 1531 03/14/21 1753  HGB 11.3*  --   HCT 34.1*  --   PLT 272  --   APTT  --  24  LABPROT  --  13.6  INR  --  1.0  CREATININE 1.02*  --   TROPONINIHS 996*  --     Estimated Creatinine Clearance: 35.7 mL/min (A) (by C-G formula based on SCr of 1.02 mg/dL (H)).   Medical History: Past Medical History:  Diagnosis Date  . Arthritis   . Headache   . Hyperlipidemia   . Hypertension     Medications:  No anticoagulation PTA noted   Assessment: 75 year old female with history of hypertension, hyperlipidemia, who is admitted with NSTEMI. Pharmacy has been consulted to initiate and manage heparin infusion.   Goal of Therapy:  Heparin level 0.3-0.7 units/ml Monitor platelets by anticoagulation protocol: Yes   Plan:  Give 3450 units bolus x 1 Start heparin infusion at 700 units/hr Check anti-Xa level in 8 hours and daily while on heparin Continue to monitor H&H and platelets  61, PharmD, BCPS Clinical Pharmacist  03/14/2021,6:05 PM

## 2021-03-14 NOTE — H&P (Deleted)
Brief note regarding plan, with full H&P to follow:  74-year-old female with history of hypertension, hyperlipidemia, who is admitted with NSTEMI after presenting from home to [facility] ED for evaluation of syncopal episode earlier today. Dr. Paraschos of cardiology has been consulted, and is evaluated the patient, with ensuing recommendations that include beta-blocker high intensity statin, and heparin drip.  Echocardiogram has been ordered.  Patient reports no associated chest discomfort, before or after the reported syncopal event.      Neetu Carrozza, DO Hospitalist 

## 2021-03-14 NOTE — Consult Note (Signed)
[facility] Cardiology  CARDIOLOGY CONSULT NOTE  Patient ID: Victoria [name] MRN: [id number] DOB/AGE: June 03, 1946 75 y.o.  Admit date: 03/14/2021 Referring Physician [facility] Primary Physician [facility] Primary Cardiologist  Reason for Consultation non-ST elevation myocardial infarction  HPI: 75 year old female referred for evaluation of non-ST elevation myocardial infarction.  Patient was in her usual state of health until earlier today, when she was bent over helping her husband, and experienced syncope.  He received brief bystander CPR by her son 2 to 3 minutes.  Patient was noted to be alert upon arrival by EMS with initial telemtry strip revealing atrial fibrillation at a rate of 106 bpm.  Patient was brought to [facility] emergency room were EKG revealed sinus rhythm at a rate of 98 bpm with nonspecific ST elevation in lead V2 and nonspecific ST depression in the inferior lateral leads.  Repeat ECG is later was unchanged.  Patient denied chest pain.  Patient did experience nausea.  Initial troponin was 996 in the absence of chest pain.  Repeat ECG proximately an hour and a half after initial presentation revealed sinus rhythm with resolution of all ECG changes.  Upon questioning, patient reports feeling well, denies chest pain or shortness of breath.  Review of systems complete and found to be negative unless listed above     Past Medical History:  Diagnosis Date  . Arthritis   . Headache   . Hyperlipidemia   . Hypertension     Past Surgical History:  Procedure Laterality Date  . COLONOSCOPY WITH PROPOFOL N/A 09/26/2015   Procedure: COLONOSCOPY WITH PROPOFOL;  Surgeon: Wallace Cullens, MD;  Location: [facility] ENDOSCOPY;  Service: Gastroenterology;  Laterality: N/A;  . NO PAST SURGERIES    . TUBAL LIGATION      (Not in a hospital admission)  Social History   Socioeconomic History  . Marital status: Married    Spouse name: Not on file  . Number of children: Not on file  . Years of education: Not on  file  . Highest education level: Not on file  Occupational History  . Not on file  Tobacco Use  . Smoking status: Never Smoker  . Smokeless tobacco: Never Used  Vaping Use  . Vaping Use: Never used  Substance and Sexual Activity  . Alcohol use: No  . Drug use: No  . Sexual activity: Not on file  Other Topics Concern  . Not on file  Social History Narrative  . Not on file   Social Determinants of Health   Financial Resource Strain: Not on file  Food Insecurity: Not on file  Transportation Needs: Not on file  Physical Activity: Not on file  Stress: Not on file  Social Connections: Not on file  Intimate Partner Violence: Not on file    Family History  Problem Relation Age of Onset  . Breast cancer Neg Hx       Review of systems complete and found to be negative unless listed above      PHYSICAL EXAM  General: Well developed, well nourished, in no acute distress HEENT:  Normocephalic and atramatic Neck:  No JVD.  Lungs: Clear bilaterally to auscultation and percussion. Heart: HRRR . Normal S1 and S2 without gallops or murmurs.  Abdomen: Bowel sounds are positive, abdomen soft and non-tender  Msk:  Back normal, normal gait. Normal strength and tone for age. Extremities: No clubbing, cyanosis or edema.   Neuro: Alert and oriented X 3. Psych:  Good affect, responds appropriately  Labs:  Lab Results  Component Value Date   WBC 10.8 (H) 03/14/2021   HGB 11.3 (L) 03/14/2021   HCT 34.1 (L) 03/14/2021   MCV 92.2 03/14/2021   PLT 272 03/14/2021    Recent Labs  Lab 03/14/21 1531  NA 137  K 2.8*  CL 103  CO2 22  BUN 21  CREATININE 1.02*  CALCIUM 9.3  PROT 6.9  BILITOT 0.9  ALKPHOS 80  ALT 44  AST 62*  GLUCOSE 182*   No results found for: CKTOTAL, CKMB, CKMBINDEX, TROPONINI No results found for: CHOL No results found for: HDL No results found for: LDLCALC No results found for: TRIG No results found for: CHOLHDL No results found for: LDLDIRECT     Radiology: CT Head Wo Contrast  Result Date: 03/14/2021 CLINICAL DATA:  Syncopal episode EXAM: CT HEAD WITHOUT CONTRAST TECHNIQUE: Contiguous axial images were obtained from the base of the skull through the vertex without intravenous contrast. COMPARISON:  None. FINDINGS: Brain: Ventricles are normal in size. No mass, hemorrhage, edema or other evidence of acute parenchymal abnormality. No extra-axial hemorrhage. Vascular: Chronic calcified atherosclerotic changes of the large vessels at the skull base. No unexpected hyperdense vessel. Skull: Normal. Negative for fracture or focal lesion. Sinuses/Orbits: No acute finding. Other: None. IMPRESSION: Negative head CT. No intracranial mass, hemorrhage or edema. Electronically Signed   By: Bary Richard M.D.   On: 03/14/2021 16:19   DG Chest Portable 1 View  Result Date: 03/14/2021 CLINICAL DATA:  Syncopal episode EXAM: PORTABLE CHEST 1 VIEW COMPARISON:  None. FINDINGS: Heart size is normal. There is atherosclerotic calcification of the aorta. Pulmonary vascularity is normal. The lungs are clear. No acute regional bone finding. IMPRESSION: No active disease. Aortic atherosclerosis. Electronically Signed   By: Paulina Fusi M.D.   On: 03/14/2021 17:34    EKG: Sinus rhythm, normal ECG  ASSESSMENT AND PLAN:   1.  Non-ST elevation myocardial infarction, elevated high-sensitivity troponin (996) following syncopal episode, initial ECG nondiagnostic with ST elevation in lead V2 and ST depression noted in leads II, III, aVF, V5 and V6, which resolved 2.  Syncope, atypical presentation, received brief bystander CPR, no evidence for VT or VF, initial ECG by EMS revealed atrial fibrillation at a rate of 106 bpm, been in sinus rhythm following arrival to [facility] ED 3.  Paroxysmal atrial fibrillation, brief episode following syncopal episode, in sinus rhythm 4.  Hypokalemia, patient received IV potassium 5.  Essential hypertension, on losartan/HCTZ at home 6.   Hyperlipidemia, LDL 134 on 01/27/2021  Recommendations  1.  Agree with overall current therapy 2.  Heparin bolus and drip 3.  Add metoprolol to tartrate 25 mg twice daily 4.  Add atorvastatin 80 mg daily 5.  2D echocardiogram 6.  Defer code STEMI at this time 7.  Patient will likely require cardiac catheterization during this hospitalization    Signed: Marcina Millard MD,PhD, [facility] 03/14/2021, 5:45 PM

## 2021-03-14 NOTE — Progress Notes (Signed)
Brief note regarding plan, with full H&P to follow:  75 year old female with history of hypertension, hyperlipidemia, who is admitted with NSTEMI after presenting from home to [facility] ED for evaluation of syncopal episode earlier today. Dr. Darrold Junker of cardiology has been consulted, and is evaluated the patient, with ensuing recommendations that include beta-blocker high intensity statin, and heparin drip.  Echocardiogram has been ordered.  Patient reports no associated chest discomfort, before or after the reported syncopal event.      Newton Pigg, DO Hospitalist

## 2021-03-14 NOTE — ED Triage Notes (Signed)
Pt BIB EMS from home. Pt experienced syncopal episode at home while bending over falling onto carpeted floor. Per EMS, pt in a-fib en route with rates 110-130, no known history of a-fib.Negative stroke screen with EMS. EMS states family did CPR because they did not believe pt was breathing for approximately 5 minutes. Pt alert and oriented at this time, does not remember syncopal event.  Pt vomited with EMS and vomiting on arrival, given 4mg  zofran en route.

## 2021-03-14 NOTE — ED Notes (Signed)
MD made aware of trop of 999

## 2021-03-14 NOTE — ED Notes (Signed)
Pt husband at bedside

## 2021-03-14 NOTE — ED Provider Notes (Signed)
[facility] Emergency Department Provider Note  Time seen: 3:28 PM  I have reviewed the triage vital signs and the nursing notes.   HISTORY  Chief Complaint Loss of Consciousness   HPI Victoria [name] is a 75 y.o. female with a past medical history of arthritis, hypertension, hyperlipidemia, presents to the emergency department for loss of consciousness and CPR.  According to the patient and family report the patient was helping her husband she bent over and had a loss of consciousness falling forward hitting her head on the ground.  Family per EMS report states the patient was unresponsive and they could not palpate a pulse and did not appear that she was breathing so they started CPR for approximately 2 to 3 minutes before they realized the patient was breathing and moving once again.  EMS states upon transport to the emergency department patient is somnolent with occasional episodes of nausea and vomiting thought to be in atrial fibrillation with a heart rate between 100 to 130 bpm.  Here upon arrival patient's heart rate around 100 bpm but appears to be a sinus rhythm.  Patient is somnolent keeps her eyes closed for most of my examination.  Denies any chest pain or shortness of breath.  Denies any vomiting or diarrhea prior to the event.  Denies any recent illnesses or fever.  Past Medical History:  Diagnosis Date  . Arthritis   . Headache   . Hyperlipidemia   . Hypertension     There are no problems to display for this patient.   Past Surgical History:  Procedure Laterality Date  . COLONOSCOPY WITH PROPOFOL N/A 09/26/2015   Procedure: COLONOSCOPY WITH PROPOFOL;  Surgeon: Wallace Cullens, MD;  Location: [facility] ENDOSCOPY;  Service: Gastroenterology;  Laterality: N/A;  . NO PAST SURGERIES    . TUBAL LIGATION      Prior to Admission medications   Medication Sig Start Date End Date Taking? Authorizing Provider  aspirin 325 MG tablet Take 325 mg by mouth daily.     [provider]  b complex vitamins capsule Take 1 capsule by mouth daily.    [provider]  valsartan-hydrochlorothiazide (DIOVAN-HCT) 320-25 MG tablet Take 1 tablet by mouth daily.    [provider]    No Known Allergies  Family History  Problem Relation Age of Onset  . Breast cancer Neg Hx     Social History Social History   Tobacco Use  . Smoking status: Never Smoker  . Smokeless tobacco: Never Used  Vaping Use  . Vaping Use: Never used  Substance Use Topics  . Alcohol use: No  . Drug use: No    Review of Systems Constitutional: Negative for fever.  Positive for LOC.  Positive for weakness. Cardiovascular: Negative for chest pain. Respiratory: Negative for shortness of breath.  No cough. Gastrointestinal: Negative for abdominal pain positive for nausea and vomiting. Musculoskeletal: Negative for musculoskeletal complaints Neurological: Negative for headache All other ROS negative  ____________________________________________   PHYSICAL EXAM:  Constitutional: Patient keeps her eyes closed but is awake and alert will answer questions but is sluggish to respond. Eyes: Normal exam ENT      Head: Normocephalic and atraumatic.      Mouth/Throat: Dry mucous membranes with occasional retching. Cardiovascular: Normal rate, regular rhythm rate 100 bpm. Respiratory: Normal respiratory effort without tachypnea nor retractions. Breath sounds are clear  Gastrointestinal: Soft and nontender. No distention.  Musculoskeletal: Nontender with normal range of motion in all  extremities.  Neurologic:  Normal speech and language. No gross focal neurologic deficits Skin:  Skin is warm, dry and intact.  Psychiatric: Mood and affect are normal.  ____________________________________________    EKG  EKG viewed and interpreted by myself shows a normal sinus rhythm at 98 bpm with a narrow QRS, normal axis, normal intervals, does appear to have mild ST  depression in the inferior leads.  V2 although not significantly elevated does have a concerning morphology.  We will repeat an EKG in 5 minutes.  Repeat EKG viewed and interpreted by myself shows sinus tachycardia 107 bpm with a narrow QRS, normal axis, normal intervals.  Continues to have mild ST changes with mild depression in the inferior leads and borderline anterior lead ST elevation.  Not currently meeting STEMI criteria.  We will continue to closely monitor.  Repeat EKG #2 viewed and interpreted by myself shows normal sinus rhythm 83 bpm with a narrow QRS, normal axis, normal intervals, nonspecific ST changes.  This EKG appears to be improved from prior EKGs. ____________________________________________    RADIOLOGY  CT scan head is negative.  ____________________________________________   INITIAL IMPRESSION / ASSESSMENT AND PLAN / ED COURSE  Pertinent labs & imaging results that were available during my care of the patient were reviewed by me and considered in my medical decision making (see chart for details).   Patient presents emergency department for syncopal episode/LOC and CPR performed.  Upon arrival patient states she is feeling very weak and continues feel nauseated occasional dry heaving.  Blood pressure is reassuring.  Heart rate around 100 bpm.  Patient story is quite concerning and the patient will require admission to the hospital regardless of her work-up however patient will need urgent work-up to rule out ACS including a repeat EKG in several minutes given a somewhat concerning initial EKG although does not meet STEMI criteria.  We will check labs including cardiac enzymes, IV hydrate, treat nausea.  Given the patient's fall and possible head injury although no traumatic signs on examination we will obtain a CT scan of the head as a precaution as well as rule out intracranial abnormality.  Patient's troponin has resulted elevated 999.  Given the patient's somewhat  concerning EKGs and elevated troponin I spoke to Dr. Darrold Junker on-call for cardiology/STEMI, who is currently reviewing the EKGs.  I will order heparin infusion for the patient.  Patient takes 325 aspirin daily including this morning per patient.  Victoria [name] was evaluated in Emergency Department on 03/14/2021 for the symptoms described in the history of present illness. She was evaluated in the context of the global COVID-19 pandemic, which necessitated consideration that the patient might be at risk for infection with the SARS-CoV-2 virus that causes COVID-19. Institutional protocols and algorithms that pertain to the evaluation of patients at risk for COVID-19 are in a state of rapid change based on information released by regulatory bodies including the CDC and federal and state organizations. These policies and algorithms were followed during the patient's care in the ED.  ____________________________________________   FINAL CLINICAL IMPRESSION(S) / ED DIAGNOSES  NSTEMI   Minna Antis, MD 03/14/21 2120

## 2021-03-15 ENCOUNTER — Encounter: Payer: Self-pay | Admitting: Internal Medicine

## 2021-03-15 ENCOUNTER — Inpatient Hospital Stay: Admit: 2021-03-15 | Payer: Medicare PPO

## 2021-03-15 ENCOUNTER — Inpatient Hospital Stay
Admit: 2021-03-15 | Discharge: 2021-03-15 | Disposition: A | Discharge status: Home / Self Care | Payer: Medicare PPO | Attending: Internal Medicine | Admitting: Internal Medicine

## 2021-03-15 DIAGNOSIS — I214 Non-ST elevation (NSTEMI) myocardial infarction: Secondary | ICD-10-CM | POA: Diagnosis not present

## 2021-03-15 DIAGNOSIS — E785 Hyperlipidemia, unspecified: Secondary | ICD-10-CM | POA: Diagnosis present

## 2021-03-15 DIAGNOSIS — E876 Hypokalemia: Secondary | ICD-10-CM | POA: Diagnosis present

## 2021-03-15 DIAGNOSIS — R55 Syncope and collapse: Secondary | ICD-10-CM | POA: Diagnosis present

## 2021-03-15 DIAGNOSIS — I10 Essential (primary) hypertension: Secondary | ICD-10-CM | POA: Diagnosis present

## 2021-03-15 LAB — BASIC METABOLIC PANEL
Anion gap: 7 (ref 5–15)
BUN: 17 mg/dL (ref 8–23)
CO2: 23 mmol/L (ref 22–32)
Calcium: 8.8 mg/dL — ABNORMAL LOW (ref 8.9–10.3)
Chloride: 105 mmol/L (ref 98–111)
Creatinine, Ser: 0.9 mg/dL (ref 0.44–1.00)
GFR, Estimated: >60 mL/min (ref >60)
Glucose, Bld: 110 mg/dL — ABNORMAL HIGH (ref 70–99)
Potassium: 3.9 mmol/L (ref 3.5–5.1)
Sodium: 135 mmol/L (ref 135–145)

## 2021-03-15 LAB — MAGNESIUM: Magnesium: 2.1 mg/dL (ref 1.7–2.4)

## 2021-03-15 LAB — CBC
HCT: 31 % — ABNORMAL LOW (ref 36.0–46.0)
Hemoglobin: 10.4 g/dL — ABNORMAL LOW (ref 12.0–15.0)
MCH: 31 pg (ref 26.0–34.0)
MCHC: 33.5 g/dL (ref 30.0–36.0)
MCV: 92.5 fL (ref 80.0–100.0)
Platelets: 204 10*3/uL (ref 150–400)
RBC: 3.35 MIL/uL — ABNORMAL LOW (ref 3.87–5.11)
RDW: 12.7 % (ref 11.5–15.5)
WBC: 7 10*3/uL (ref 4.0–10.5)
nRBC: 0 % (ref 0.0–0.2)

## 2021-03-15 LAB — HEPARIN LEVEL (UNFRACTIONATED)
Heparin Unfractionated: 0.62 IU/mL (ref 0.30–0.70)
Heparin Unfractionated: 0.37 IU/mL (ref 0.30–0.70)

## 2021-03-15 LAB — TROPONIN I (HIGH SENSITIVITY)
Troponin I (High Sensitivity): 1288 ng/L (ref <18)
Troponin I (High Sensitivity): 1049 ng/L (ref <18)

## 2021-03-15 MED ORDER — SODIUM CHLORIDE 0.9% FLUSH
3.0000 mL | Freq: Two times a day (BID) | INTRAVENOUS | Status: DC
  Administered 2021-03-15 – 2021-03-18 (×4): 3 mL via INTRAVENOUS

## 2021-03-15 MED ORDER — POTASSIUM CHLORIDE 10 MEQ/100ML IV SOLN
INTRAVENOUS | Status: AC
  Filled 2021-03-15: qty 100

## 2021-03-15 MED ORDER — POTASSIUM CHLORIDE 10 MEQ/100ML IV SOLN
10.0000 meq | INTRAVENOUS | Status: AC
  Administered 2021-03-15 (×4): 10 meq via INTRAVENOUS
  Filled 2021-03-15 (×3): qty 100

## 2021-03-15 MED ORDER — ASPIRIN EC 81 MG PO TBEC
81.0000 mg | DELAYED_RELEASE_TABLET | Freq: Every day | ORAL | Status: DC
  Administered 2021-03-15 – 2021-03-19 (×4): 81 mg via ORAL
  Filled 2021-03-15 (×4): qty 1

## 2021-03-15 MED ORDER — LOSARTAN POTASSIUM 50 MG PO TABS
50.0000 mg | ORAL_TABLET | Freq: Every day | ORAL | Status: DC
  Administered 2021-03-15 – 2021-03-19 (×4): 50 mg via ORAL
  Filled 2021-03-15 (×4): qty 1

## 2021-03-15 NOTE — Progress Notes (Signed)
[facility] Cardiology  SUBJECTIVE: Patient laying in bed, denies chest pain or shortness of breath   Vitals:   03/15/21 0500 03/15/21 0730 03/15/21 0900 03/15/21 0930  BP: 121/72 (!) 157/81 (!) 146/74 (!) 148/75  Pulse: 61 73 65 65  Resp: 16 13 13  (!) 21  Temp:      TempSrc:      SpO2: 100% 100% 100% 100%  Weight:      Height:         Intake/Output Summary (Last 24 hours) at 03/15/2021 0958 Last data filed at 03/15/2021 0915 Gross per 24 hour  Intake 296.82 ml  Output --  Net 296.82 ml      PHYSICAL EXAM  General: Well developed, well nourished, in no acute distress HEENT:  Normocephalic and atramatic Neck:  No JVD.  Lungs: Clear bilaterally to auscultation and percussion. Heart: HRRR . Normal S1 and S2 without gallops or murmurs.  Abdomen: Bowel sounds are positive, abdomen soft and non-tender  Msk:  Back normal, normal gait. Normal strength and tone for age. Extremities: No clubbing, cyanosis or edema.   Neuro: Alert and oriented X 3. Psych:  Good affect, responds appropriately   LABS: Basic Metabolic Panel: Recent Labs    03/14/21 1531 03/15/21 0454  NA 137 135  K 2.8* 3.9  CL 103 105  CO2 22 23  GLUCOSE 182* 110*  BUN 21 17  CREATININE 1.02* 0.90  CALCIUM 9.3 8.8*  MG 2.1 2.1   Liver Function Tests: Recent Labs    03/14/21 1531  AST 62*  ALT 44  ALKPHOS 80  BILITOT 0.9  PROT 6.9  ALBUMIN 4.1   No results for input(s): LIPASE, AMYLASE in the last 72 hours. CBC: Recent Labs    03/14/21 1531 03/15/21 0454  WBC 10.8* 7.0  HGB 11.3* 10.4*  HCT 34.1* 31.0*  MCV 92.2 92.5  PLT 272 204   Cardiac Enzymes: No results for input(s): CKTOTAL, CKMB, CKMBINDEX, TROPONINI in the last 72 hours. BNP: Invalid input(s): POCBNP D-Dimer: No results for input(s): DDIMER in the last 72 hours. Hemoglobin A1C: No results for input(s): HGBA1C in the last 72 hours. Fasting Lipid Panel: No results for input(s): CHOL, HDL, LDLCALC, TRIG, CHOLHDL, LDLDIRECT in the  last 72 hours. Thyroid Function Tests: No results for input(s): TSH, T4TOTAL, T3FREE, THYROIDAB in the last 72 hours.  Invalid input(s): FREET3 Anemia Panel: No results for input(s): VITAMINB12, FOLATE, FERRITIN, TIBC, IRON, RETICCTPCT in the last 72 hours.  CT Head Wo Contrast  Result Date: 03/14/2021 CLINICAL DATA:  Syncopal episode EXAM: CT HEAD WITHOUT CONTRAST TECHNIQUE: Contiguous axial images were obtained from the base of the skull through the vertex without intravenous contrast. COMPARISON:  None. FINDINGS: Brain: Ventricles are normal in size. No mass, hemorrhage, edema or other evidence of acute parenchymal abnormality. No extra-axial hemorrhage. Vascular: Chronic calcified atherosclerotic changes of the large vessels at the skull base. No unexpected hyperdense vessel. Skull: Normal. Negative for fracture or focal lesion. Sinuses/Orbits: No acute finding. Other: None. IMPRESSION: Negative head CT. No intracranial mass, hemorrhage or edema. Electronically Signed   By: 03/16/2021 M.D.   On: 03/14/2021 16:19   DG Chest Portable 1 View  Result Date: 03/14/2021 CLINICAL DATA:  Syncopal episode EXAM: PORTABLE CHEST 1 VIEW COMPARISON:  None. FINDINGS: Heart size is normal. There is atherosclerotic calcification of the aorta. Pulmonary vascularity is normal. The lungs are clear. No acute regional bone finding. IMPRESSION: No active disease. Aortic atherosclerosis. Electronically Signed  By: Paulina Fusi M.D.   On: 03/14/2021 17:34     Echo pending  TELEMETRY: Sinus rhythm:  ASSESSMENT AND PLAN:  Principal Problem:   NSTEMI (non-ST elevated myocardial infarction) (HCC) Active Problems:   Syncope   Hypokalemia   Hypertension   Hyperlipidemia    1.  Non-ST elevation myocardial infarction, elevated high-sensitivity troponin (996, 619,5093) following syncopal episode, initial ECG nondiagnostic with ST elevation in lead V2 and ST depression noted in leads II, III, aVF, V5 and V6,  which resolved 2.  Syncope, atypical presentation, received brief bystander CPR, no evidence for VT or VF, initial ECG by EMS revealed atrial fibrillation at a rate of 106 bpm, been in sinus rhythm following arrival to [facility] ED 3.  Paroxysmal atrial fibrillation, brief episode following syncopal episode, in sinus rhythm 4.  Hypokalemia, patient received IV potassium 5.  Essential hypertension, on losartan/HCTZ at home 6.  Hyperlipidemia, LDL 134 on 01/27/2021  Recommendations  1.  Agree with current therapy 2.  Continue heparin drip 3.  Continue metoprolol tartrate 25 mg twice daily 4.  Continue high intensity atorvastatin 80 mg daily 5.  Review 2D echocardiogram 6.  Cardiac catheterization and possible PCI scheduled for 03/17/2021.  The risk, benefits alternatives of cardiac catheterization and possible PCI were explained to the patient and informed consent was obtained.   Marcina Millard, MD, PhD, [facility] 03/15/2021 9:58 AM

## 2021-03-15 NOTE — Progress Notes (Signed)
*  PRELIMINARY RESULTS* Echocardiogram 2D Echocardiogram has been performed.  Victoria [name] 03/15/2021, 3:52 PM

## 2021-03-15 NOTE — Consult Note (Signed)
ANTICOAGULATION CONSULT NOTE  Pharmacy Consult for heparin infusion Indication: chest pain/ACS  No Known Allergies  Patient Measurements: Height: 4\' 10"  (147.3 cm) Weight: 50 kg (110 lb 3.2 oz) IBW/kg (Calculated) : 40.9 Heparin Dosing Weight: 57.4 kg  Vital Signs: Temp: 97.9 F (36.6 C) (04/30 1102) Temp Source: Oral (04/30 1102) BP: 119/75 (04/30 1102) Pulse Rate: 61 (04/30 1102)  Labs: Recent Labs    03/14/21 1531 03/14/21 1703 03/14/21 1753 03/15/21 0454 03/15/21 1246  HGB 11.3*  --   --  10.4*  --   HCT 34.1*  --   --  31.0*  --   PLT 272  --   --  204  --   APTT  --   --  24  --   --   LABPROT  --   --  13.6  --   --   INR  --   --  1.0  --   --   HEPARINUNFRC  --   --   --  0.62 0.37  CREATININE 1.02*  --   --  0.90  --   TROPONINIHS 996* 797*  --  1,288*  --     Estimated Creatinine Clearance: 37.9 mL/min (by C-G formula based on SCr of 0.9 mg/dL).   Medical History: Past Medical History:  Diagnosis Date  . Arthritis   . Headache   . Hyperlipidemia   . Hypertension     Medications:  No anticoagulation PTA noted   Assessment: 75 year old female with history of hypertension, hyperlipidemia, who is admitted with NSTEMI. Pharmacy has been consulted to initiate and manage heparin  infusion.   4/30:  HL @ 0454 = 0.62   Goal of Therapy:  Heparin level 0.3-0.7 units/ml Monitor platelets by anticoagulation protocol: Yes   Plan:  4/30:  HL @ 1246= 0.37  therapeutic x 2 Will continue pt on current rate change and recheck HLwith am labs  5/30, PharmD Clinical Pharmacist  03/15/2021,2:21 PM

## 2021-03-15 NOTE — Progress Notes (Signed)
Pt alert and oriented x 4. No complaints of pain this shift. Continues on heparin drip. Report given to Toniann Fail RN at bedside.

## 2021-03-15 NOTE — Progress Notes (Signed)
PROGRESS NOTE    Victoria [name]  XLK:[id number] DOB: 04-09-1946 DOA: 03/14/2021 PCP: Dorothey Baseman, MD  Outpatient Specialists: none    Brief Narrative:   Victoria [name] is a 75 y.o. female with medical history significant for hypertension, hyperlipidemia who is admitted to [facility] on 03/14/2021 with NSTEMI after presenting from home to [facility] ED for evaluation of syncope.   The patient reportedly was at home earlier this afternoon when she leaned forward to pick up something located on her near the floor at which time she experienced consciousness that was witnessed by her family, who reported that the patient fell straight forward, hitting her head on the floor.  At that time, she was completely unconscious and nonresponsive per her family, who did not reportedly initiated 1 to 2 minutes of CPR while contacting EMS.  Socially, patient brought to [facility] ED via EMS, and received a full dose aspirin in route.  Provide discussions with the patient, she denies any prior syncopal event similar to the above.  She denies any prodrome leading up to her loss of consciousness.  She reports that she was in her baseline state of health today leading up to the syncopal event.  She denies any recent or subsequent chest pain.  Additionally she denies any recent shortness of breath, palpitations, diaphoresis, nausea, vomiting.  No preceding trauma.  Denies any recent wheezing, cough, hemoptysis, lower extremity erythema, [place]ower[place] new onset calf tenderness.  She also denies any recent orthopnea, PND, or peripheral edema.  No subjective fever, chills, rigors, or generalized myalgias.  No recent travel.  She denies any known history of underlying coronary artery disease.  Acknowledges a history of hypertension for which she reports good compliance with her outpatient losartan and HCTZ.  Also acknowledges a history of hyperlipidemia that she reports is managed via lifestyle modifications, and  is not currently on a statin medication at home.  Denies any known history of underlying diabetes, and reports that she is a lifelong non-smoker.  No known family history of premature coronary artery disease.    Assessment & Plan:   Principal Problem:   NSTEMI (non-ST elevated myocardial infarction) (HCC) Active Problems:   Syncope   Hypokalemia   Hypertension   Hyperlipidemia  #) NSTEMI:  No hx cad. Trop has trended to 1288. Chest pain free. Cardiology following - continuing heparin gtt, metoprolol, atorvastatin, aspirin - tele - TTE ordered - trend troponins - probable cath this admission, discussed w/ cardiology, likely on Monday  # Syncope Likely 2/2 nstemi. Fell backwards and hit head. No bruise or laceration, ct neg. Mild ha currently, no focal neurologic deficits - monitor tele  # Hypokalemia Likely 2/2 hctz which has been held. Repleted, k wnl, mg wnl. - monitor 1 episode of po  # Essential hypertension: bp mild elevation this morning - on losart/hctz at home. Will add back losartan - metoprolol started    DVT prophylaxis: heparin therapeutic Code Status: full Family Communication: none @ bedside. Husband has metastatic cancer, she says her youngest son would be her medical decision maker  Level of care: Progressive Cardiac Status is: Inpatient  Remains inpatient appropriate because:Inpatient level of care appropriate due to severity of illness   Dispo: The patient is from: Home              Anticipated d/c is to: Home              Patient currently is not medically stable to d/c.  Difficult to place patient No        Consultants:  cardiology  Procedures: none  Antimicrobials:  none    Subjective: This morning feels well, no chest pain, no sob  Objective: Vitals:   03/15/21 0400 03/15/21 0430 03/15/21 0500 03/15/21 0730  BP: (!) 115/56 119/67 121/72 (!) 157/81  Pulse: (!) 47 (!) 56 61 73  Resp: 13 14 16 13   Temp:       TempSrc:      SpO2: 97% 96% 100% 100%  Weight:      Height:        Intake/Output Summary (Last 24 hours) at 03/15/2021 0843 Last data filed at 03/15/2021 0617 Gross per 24 hour  Intake 196.82 ml  Output --  Net 196.82 ml   Filed Weights   03/14/21 1700  Weight: 57.4 kg    Examination:  General exam: Appears calm and comfortable  Respiratory system: Clear to auscultation. Respiratory effort normal. Cardiovascular system: S1 & S2 heard, RRR. No JVD, murmurs, rubs, gallops or clicks. No pedal edema. Gastrointestinal system: Abdomen is nondistended, soft and nontender. No organomegaly or masses felt. Normal bowel sounds heard. Central nervous system: Alert and oriented. No focal neurological deficits. Extremities: Symmetric 5 x 5 power. Skin: No rashes, lesions or ulcers Psychiatry: Judgement and insight appear normal. Mood & affect appropriate.     Data Reviewed: I have personally reviewed following labs and imaging studies  CBC: Recent Labs  Lab 03/14/21 1531 03/15/21 0454  WBC 10.8* 7.0  HGB 11.3* 10.4*  HCT 34.1* 31.0*  MCV 92.2 92.5  PLT 272 204   Basic Metabolic Panel: Recent Labs  Lab 03/14/21 1531 03/15/21 0454  NA 137 135  K 2.8* 3.9  CL 103 105  CO2 22 23  GLUCOSE 182* 110*  BUN 21 17  CREATININE 1.02* 0.90  CALCIUM 9.3 8.8*  MG 2.1 2.1   GFR: Estimated Creatinine Clearance: 40.5 mL/min (by C-G formula based on SCr of 0.9 mg/dL). Liver Function Tests: Recent Labs  Lab 03/14/21 1531  AST 62*  ALT 44  ALKPHOS 80  BILITOT 0.9  PROT 6.9  ALBUMIN 4.1   No results for input(s): LIPASE, AMYLASE in the last 168 hours. No results for input(s): AMMONIA in the last 168 hours. Coagulation Profile: Recent Labs  Lab 03/14/21 1753  INR 1.0   Cardiac Enzymes: No results for input(s): CKTOTAL, CKMB, CKMBINDEX, TROPONINI in the last 168 hours. BNP (last 3 results) No results for input(s): PROBNP in the last 8760 hours. HbA1C: No results for  input(s): HGBA1C in the last 72 hours. CBG: No results for input(s): GLUCAP in the last 168 hours. Lipid Profile: No results for input(s): CHOL, HDL, LDLCALC, TRIG, CHOLHDL, LDLDIRECT in the last 72 hours. Thyroid Function Tests: No results for input(s): TSH, T4TOTAL, FREET4, T3FREE, THYROIDAB in the last 72 hours. Anemia Panel: No results for input(s): VITAMINB12, FOLATE, FERRITIN, TIBC, IRON, RETICCTPCT in the last 72 hours. Urine analysis: No results found for: COLORURINE, APPEARANCEUR, LABSPEC, PHURINE, GLUCOSEU, HGBUR, BILIRUBINUR, KETONESUR, PROTEINUR, UROBILINOGEN, NITRITE, LEUKOCYTESUR Sepsis Labs: @LABRCNTIP (procalcitonin:4,lacticidven:4)  ) Recent Results (from the past 240 hour(s))  Resp Panel by RT-PCR (Flu A&B, Covid) Nasopharyngeal Swab     Status: None   Collection Time: 03/14/21  3:31 PM   Specimen: Nasopharyngeal Swab; Nasopharyngeal(NP) swabs in vial transport medium  Result Value Ref Range Status   SARS Coronavirus 2 by RT PCR NEGATIVE NEGATIVE Final    Comment: (NOTE) SARS-CoV-2 target nucleic acids are  NOT DETECTED.  The SARS-CoV-2 RNA is generally detectable in upper respiratory specimens during the acute phase of infection. The lowest concentration of SARS-CoV-2 viral copies this assay can detect is 138 copies/mL. A negative result does not preclude SARS-Cov-2 infection and should not be used as the sole basis for treatment or other patient management decisions. A negative result may occur with  improper specimen collection/handling, submission of specimen other than nasopharyngeal swab, presence of viral mutation(s) within the areas targeted by this assay, and inadequate number of viral copies(<138 copies/mL). A negative result must be combined with clinical observations, patient history, and epidemiological information. The expected result is Negative.  Fact Sheet for Patients:  BloggerCourse.com  Fact Sheet for Healthcare  Providers:  SeriousBroker.it  This test is no t yet approved or cleared by the [place] FDA and  has been authorized for detection and/or diagnosis of SARS-CoV-2 by FDA under an Emergency Use Authorization (EUA). This EUA will remain  in effect (meaning this test can be used) for the duration of the COVID-19 declaration under Section 564(b)(1) of the Act, 21 U.S.C.section 360bbb-3(b)(1), unless the authorization is terminated  or revoked sooner.       Influenza A by PCR NEGATIVE NEGATIVE Final   Influenza B by PCR NEGATIVE NEGATIVE Final    Comment: (NOTE) The Xpert Xpress SARS-CoV-2/FLU/RSV plus assay is intended as an aid in the diagnosis of influenza from Nasopharyngeal swab specimens and should not be used as a sole basis for treatment. Nasal washings and aspirates are unacceptable for Xpert Xpress SARS-CoV-2/FLU/RSV testing.  Fact Sheet for Patients: BloggerCourse.com  Fact Sheet for Healthcare Providers: SeriousBroker.it  This test is not yet approved or cleared by the [place] FDA and has been authorized for detection and/or diagnosis of SARS-CoV-2 by FDA under an Emergency Use Authorization (EUA). This EUA will remain in effect (meaning this test can be used) for the duration of the COVID-19 declaration under Section 564(b)(1) of the Act, 21 U.S.C. section 360bbb-3(b)(1), unless the authorization is terminated or revoked.  Performed at [facility], [address] [place], [place] [address]          Radiology Studies: CT Head Wo Contrast  Result Date: 03/14/2021 CLINICAL DATA:  Syncopal episode EXAM: CT HEAD WITHOUT CONTRAST TECHNIQUE: Contiguous axial images were obtained from the base of the skull through the vertex without intravenous contrast. COMPARISON:  None. FINDINGS: Brain: Ventricles are normal in size. No mass, hemorrhage, edema or other evidence of acute  parenchymal abnormality. No extra-axial hemorrhage. Vascular: Chronic calcified atherosclerotic changes of the large vessels at the skull base. No unexpected hyperdense vessel. Skull: Normal. Negative for fracture or focal lesion. Sinuses/Orbits: No acute finding. Other: None. IMPRESSION: Negative head CT. No intracranial mass, hemorrhage or edema. Electronically Signed   By: Bary Richard M.D.   On: 03/14/2021 16:19   DG Chest Portable 1 View  Result Date: 03/14/2021 CLINICAL DATA:  Syncopal episode EXAM: PORTABLE CHEST 1 VIEW COMPARISON:  None. FINDINGS: Heart size is normal. There is atherosclerotic calcification of the aorta. Pulmonary vascularity is normal. The lungs are clear. No acute regional bone finding. IMPRESSION: No active disease. Aortic atherosclerosis. Electronically Signed   By: Paulina Fusi M.D.   On: 03/14/2021 17:34        Scheduled Meds: . aspirin EC  81 mg Oral Daily  . atorvastatin  80 mg Oral Daily  . metoprolol tartrate  25 mg Oral BID   Continuous Infusions: . heparin 700 Units/hr (  03/14/21 1749)     LOS: 1 day    Time spent: 35 min    Silvano BilisNoah B Katherene Dinino, MD [facility]   If 7PM-7AM, please contact night-coverage [email or web address] Password [facility] 03/15/2021, 8:43 AM

## 2021-03-15 NOTE — Consult Note (Signed)
ANTICOAGULATION CONSULT NOTE - Initial Consult  Pharmacy Consult for heparin infusion Indication: chest pain/ACS  No Known Allergies  Patient Measurements: Height: 4\' 10"  (147.3 cm) Weight: 57.4 kg (126 lb 8.7 oz) IBW/kg (Calculated) : 40.9 Heparin Dosing Weight: 57.4 kg  Vital Signs: BP: 121/72 (04/30 0500) Pulse Rate: 61 (04/30 0500)  Labs: Recent Labs    03/14/21 1531 03/14/21 1703 03/14/21 1753 03/15/21 0454  HGB 11.3*  --   --  10.4*  HCT 34.1*  --   --  31.0*  PLT 272  --   --  204  APTT  --   --  24  --   LABPROT  --   --  13.6  --   INR  --   --  1.0  --   HEPARINUNFRC  --   --   --  0.62  CREATININE 1.02*  --   --  0.90  TROPONINIHS 996* 797*  --   --     Estimated Creatinine Clearance: 40.5 mL/min (by C-G formula based on SCr of 0.9 mg/dL).   Medical History: Past Medical History:  Diagnosis Date  . Arthritis   . Headache   . Hyperlipidemia   . Hypertension     Medications:  No anticoagulation PTA noted   Assessment: 75 year old female with history of hypertension, hyperlipidemia, who is admitted with NSTEMI. Pharmacy has been consulted to initiate and manage heparin infusion.   Goal of Therapy:  Heparin level 0.3-0.7 units/ml Monitor platelets by anticoagulation protocol: Yes   Plan:  4/30:  HL @ 0454 = 0.62 Will continue pt on current rate change and recheck HL in 8 hrs on 4/30 @ 1300.   5/30, PharmD Clinical Pharmacist  03/15/2021,6:08 AM

## 2021-03-16 DIAGNOSIS — I214 Non-ST elevation (NSTEMI) myocardial infarction: Secondary | ICD-10-CM | POA: Diagnosis not present

## 2021-03-16 LAB — ECHOCARDIOGRAM COMPLETE
AR max vel: 1.98 cm2
AV Peak grad: 5.2 mmHg
Ao pk vel: 1.14 m/s
Area-P 1/2: 4.36 cm2
Height: 58 in
S' Lateral: 2.73 cm
Weight: 1763.2 oz

## 2021-03-16 LAB — CBC
HCT: 32.5 % — ABNORMAL LOW (ref 36.0–46.0)
Hemoglobin: 10.8 g/dL — ABNORMAL LOW (ref 12.0–15.0)
MCH: 31.1 pg (ref 26.0–34.0)
MCHC: 33.2 g/dL (ref 30.0–36.0)
MCV: 93.7 fL (ref 80.0–100.0)
Platelets: 206 10*3/uL (ref 150–400)
RBC: 3.47 MIL/uL — ABNORMAL LOW (ref 3.87–5.11)
RDW: 13 % (ref 11.5–15.5)
WBC: 8.2 10*3/uL (ref 4.0–10.5)
nRBC: 0 % (ref 0.0–0.2)

## 2021-03-16 LAB — BASIC METABOLIC PANEL
Anion gap: 4 — ABNORMAL LOW (ref 5–15)
BUN: 19 mg/dL (ref 8–23)
CO2: 23 mmol/L (ref 22–32)
Calcium: 8.6 mg/dL — ABNORMAL LOW (ref 8.9–10.3)
Chloride: 113 mmol/L — ABNORMAL HIGH (ref 98–111)
Creatinine, Ser: 0.86 mg/dL (ref 0.44–1.00)
GFR, Estimated: >60 mL/min (ref >60)
Glucose, Bld: 93 mg/dL (ref 70–99)
Potassium: 4 mmol/L (ref 3.5–5.1)
Sodium: 140 mmol/L (ref 135–145)

## 2021-03-16 LAB — TROPONIN I (HIGH SENSITIVITY): Troponin I (High Sensitivity): 1047 ng/L (ref <18)

## 2021-03-16 LAB — HEPARIN LEVEL (UNFRACTIONATED): Heparin Unfractionated: 0.34 IU/mL (ref 0.30–0.70)

## 2021-03-16 LAB — MAGNESIUM: Magnesium: 2.1 mg/dL (ref 1.7–2.4)

## 2021-03-16 MED ORDER — SODIUM CHLORIDE 0.9 % IV SOLN: 250.0000 mL | INTRAVENOUS | Status: DC | PRN

## 2021-03-16 MED ORDER — SODIUM CHLORIDE 0.9 % WEIGHT BASED INFUSION
3.0000 mL/kg/h | INTRAVENOUS | Status: AC
  Administered 2021-03-16: 3 mL/kg/h via INTRAVENOUS

## 2021-03-16 MED ORDER — SODIUM CHLORIDE 0.9% FLUSH: 3.0000 mL | INTRAVENOUS | Status: DC | PRN

## 2021-03-16 MED ORDER — ASPIRIN 81 MG PO CHEW
81.0000 mg | CHEWABLE_TABLET | ORAL | Status: AC
  Administered 2021-03-17: 81 mg via ORAL
  Filled 2021-03-16: qty 1

## 2021-03-16 MED ORDER — SODIUM CHLORIDE 0.9 % WEIGHT BASED INFUSION
1.0000 mL/kg/h | INTRAVENOUS | Status: DC
  Administered 2021-03-17: 1 mL/kg/h via INTRAVENOUS

## 2021-03-16 NOTE — Progress Notes (Signed)
[facility] Cardiology  SUBJECTIVE: Patient laying in bed, denies chest pain or shortness of breath   Vitals:   03/15/21 1946 03/16/21 0453 03/16/21 0500 03/16/21 0717  BP: 105/65 (!) 142/80  133/68  Pulse: 68 61  64  Resp: 16 16  18   Temp: 98.1 F (36.7 C) 97.8 F (36.6 C)  97.8 F (36.6 C)  TempSrc: Oral Oral  Oral  SpO2: 97% 100%  98%  Weight:   49.8 kg   Height:         Intake/Output Summary (Last 24 hours) at 03/16/2021 0841 Last data filed at 03/16/2021 05/16/2021 Gross per 24 hour  Intake 1069.6 ml  Output 2975 ml  Net -1905.4 ml      PHYSICAL EXAM  General: Well developed, well nourished, in no acute distress HEENT:  Normocephalic and atramatic Neck:  No JVD.  Lungs: Clear bilaterally to auscultation and percussion. Heart: HRRR . Normal S1 and S2 without gallops or murmurs.  Abdomen: Bowel sounds are positive, abdomen soft and non-tender  Msk:  Back normal, normal gait. Normal strength and tone for age. Extremities: No clubbing, cyanosis or edema.   Neuro: Alert and oriented X 3. Psych:  Good affect, responds appropriately   LABS: Basic Metabolic Panel: Recent Labs    03/15/21 0454 03/16/21 0352  NA 135 140  K 3.9 4.0  CL 105 113*  CO2 23 23  GLUCOSE 110* 93  BUN 17 19  CREATININE 0.90 0.86  CALCIUM 8.8* 8.6*  MG 2.1 2.1   Liver Function Tests: Recent Labs    03/14/21 1531  AST 62*  ALT 44  ALKPHOS 80  BILITOT 0.9  PROT 6.9  ALBUMIN 4.1   No results for input(s): LIPASE, AMYLASE in the last 72 hours. CBC: Recent Labs    03/15/21 0454 03/16/21 0352  WBC 7.0 8.2  HGB 10.4* 10.8*  HCT 31.0* 32.5*  MCV 92.5 93.7  PLT 204 206   Cardiac Enzymes: No results for input(s): CKTOTAL, CKMB, CKMBINDEX, TROPONINI in the last 72 hours. BNP: Invalid input(s): POCBNP D-Dimer: No results for input(s): DDIMER in the last 72 hours. Hemoglobin A1C: No results for input(s): HGBA1C in the last 72 hours. Fasting Lipid Panel: No results for input(s): CHOL,  HDL, LDLCALC, TRIG, CHOLHDL, LDLDIRECT in the last 72 hours. Thyroid Function Tests: No results for input(s): TSH, T4TOTAL, T3FREE, THYROIDAB in the last 72 hours.  Invalid input(s): FREET3 Anemia Panel: No results for input(s): VITAMINB12, FOLATE, FERRITIN, TIBC, IRON, RETICCTPCT in the last 72 hours.  CT Head Wo Contrast  Result Date: 03/14/2021 CLINICAL DATA:  Syncopal episode EXAM: CT HEAD WITHOUT CONTRAST TECHNIQUE: Contiguous axial images were obtained from the base of the skull through the vertex without intravenous contrast. COMPARISON:  None. FINDINGS: Brain: Ventricles are normal in size. No mass, hemorrhage, edema or other evidence of acute parenchymal abnormality. No extra-axial hemorrhage. Vascular: Chronic calcified atherosclerotic changes of the large vessels at the skull base. No unexpected hyperdense vessel. Skull: Normal. Negative for fracture or focal lesion. Sinuses/Orbits: No acute finding. Other: None. IMPRESSION: Negative head CT. No intracranial mass, hemorrhage or edema. Electronically Signed   By: 03/16/2021 M.D.   On: 03/14/2021 16:19   DG Chest Portable 1 View  Result Date: 03/14/2021 CLINICAL DATA:  Syncopal episode EXAM: PORTABLE CHEST 1 VIEW COMPARISON:  None. FINDINGS: Heart size is normal. There is atherosclerotic calcification of the aorta. Pulmonary vascularity is normal. The lungs are clear. No acute regional bone finding. IMPRESSION: No  active disease. Aortic atherosclerosis. Electronically Signed   By: Paulina Fusi M.D.   On: 03/14/2021 17:34     Echo pending  TELEMETRY: Sinus rhythm:  ASSESSMENT AND PLAN:  Principal Problem:   NSTEMI (non-ST elevated myocardial infarction) (HCC) Active Problems:   Syncope   Hypokalemia   Hypertension   Hyperlipidemia    1. Non-ST elevation myocardial infarction, elevated high-sensitivity troponin(996, 703,5009, 1049, 1047) following syncopal episode, initial ECG nondiagnostic with ST elevation in lead V2  and ST depression noted in leads II, III, aVF, V5 and V6, which resolved 2.Syncope, atypical presentation, received brief bystander CPR, no evidence for VT or VF,initial ECG by EMS revealed atrial fibrillation at a rate of 106 bpm, been in sinus rhythm following arrival to [facility] ED 3.Paroxysmal atrial fibrillation, brief episode following syncopal episode, in sinus rhythm 4.Hypokalemia,patient received IV potassium 5.Essential hypertension, on losartan/HCTZ at home 6.Hyperlipidemia,LDL 134 on 01/27/2021  Recommendations  1.  Agree with current therapy 2.  Continue heparin drip 3.  Continue metoprolol tartrate 25 mg twice daily 4.  Continue high intensity atorvastatin 80 mg daily 5.  Review 2D echocardiogram 6.  Cardiac catheterization and possible PCI scheduled for 03/17/2021.  The risk, benefits alternatives of cardiac catheterization and possible PCI were explained to the patient and informed consent was obtained.    Marcina Millard, MD, PhD, [facility] 03/16/2021 8:41 AM

## 2021-03-16 NOTE — Progress Notes (Signed)
PT Cancellation Note  Patient Details Name: Victoria [name] MRN: [id number] DOB: 12-06-1945   Cancelled Treatment:    Reason Eval/Treat Not Completed: Other (comment) PT orders received, chart reviewed. Pt anticipating cardiac cath & possible PCI tomorrow (03/17/21). Will hold PT evaluation at this time. Will need new PT orders following procedure, if appropriate.   Aleda Grana, PT, DPT 03/16/21, 12:56 PM   Sandi Mariscal 03/16/2021, 12:55 PM

## 2021-03-16 NOTE — Progress Notes (Signed)
PROGRESS NOTE    [name]  ZDG:[id number] DOB: Jan 16, 1946 DOA: 03/14/2021 PCP: Dorothey Baseman, MD  Outpatient Specialists: none    Brief Narrative:   Victoria [name] is a 75 y.o. female with medical history significant for hypertension, hyperlipidemia who is admitted to [facility] on 03/14/2021 with NSTEMI after presenting from home to [facility] ED for evaluation of syncope.   The patient reportedly was at home earlier this afternoon when she leaned forward to pick up something located on her near the floor at which time she experienced consciousness that was witnessed by her family, who reported that the patient fell straight forward, hitting her head on the floor.  At that time, she was completely unconscious and nonresponsive per her family, who did not reportedly initiated 1 to 2 minutes of CPR while contacting EMS.  Socially, patient brought to [facility] ED via EMS, and received a full dose aspirin in route.  Provide discussions with the patient, she denies any prior syncopal event similar to the above.  She denies any prodrome leading up to her loss of consciousness.  She reports that she was in her baseline state of health today leading up to the syncopal event.  She denies any recent or subsequent chest pain.  Additionally she denies any recent shortness of breath, palpitations, diaphoresis, nausea, vomiting.  No preceding trauma.  Denies any recent wheezing, cough, hemoptysis, lower extremity erythema, [place] new onset calf tenderness.  She also denies any recent orthopnea, PND, or peripheral edema.  No subjective fever, chills, rigors, or generalized myalgias.  No recent travel.  She denies any known history of underlying coronary artery disease.  Acknowledges a history of hypertension for which she reports good compliance with her outpatient losartan and HCTZ.  Also acknowledges a history of hyperlipidemia that she reports is managed via lifestyle modifications, and  is not currently on a statin medication at home.  Denies any known history of underlying diabetes, and reports that she is a lifelong non-smoker.  No known family history of premature coronary artery disease.    Assessment & Plan:   Principal Problem:   NSTEMI (non-ST elevated myocardial infarction) (HCC) Active Problems:   Syncope   Hypokalemia   Hypertension   Hyperlipidemia  #) NSTEMI No hx cad. Trop plateau 1288. Chest pain free. Cardiology following - continuing heparin gtt, metoprolol, atorvastatin, aspirin - tele - TTE read pending - npo after midnight for cath tomorrow  # Syncope Likely 2/2 nstemi. Fell backwards and hit head. No bruise or laceration, ct neg. Mild ha currently, no focal neurologic deficits. No recurrence - monitor tele  # Hypokalemia Likely 2/2 hctz which has been held. Repleted, k wnl, mg wnl. - monitor  # Essential hypertension: bp wnl now - on losart/hctz at home. Have added back losartan, hctz held. - metoprolol started    DVT prophylaxis: heparin therapeutic Code Status: full Family Communication: none @ bedside. Husband has metastatic cancer, she says her youngest son would be her medical decision maker. Updated telephonically 5/1.   Level of care: Progressive Cardiac Status is: Inpatient  Remains inpatient appropriate because:Inpatient level of care appropriate due to severity of illness   Dispo: The patient is from: Home              Anticipated d/c is to: Home              Patient currently is not medically stable to d/c.   Difficult to place patient No  Consultants:  cardiology  Procedures: none  Antimicrobials:  none    Subjective: This morning feels well, no chest pain, no sob  Objective: Vitals:   03/15/21 1946 03/16/21 0453 03/16/21 0500 03/16/21 0717  BP: 105/65 (!) 142/80  133/68  Pulse: 68 61  64  Resp: 16 16  18   Temp: 98.1 F (36.7 C) 97.8 F (36.6 C)  97.8 F (36.6 C)  TempSrc: Oral Oral   Oral  SpO2: 97% 100%  98%  Weight:   49.8 kg   Height:        Intake/Output Summary (Last 24 hours) at 03/16/2021 0806 Last data filed at 03/16/2021 05/16/2021 Gross per 24 hour  Intake 1069.6 ml  Output 2975 ml  Net -1905.4 ml   Filed Weights   03/14/21 1700 03/15/21 1206 03/16/21 0500  Weight: 57.4 kg 50 kg 49.8 kg    Examination:  General exam: Appears calm and comfortable  Respiratory system: Clear to auscultation. Respiratory effort normal. Cardiovascular system: S1 & S2 heard, RRR. No JVD, murmurs, rubs, gallops or clicks. No pedal edema. Gastrointestinal system: Abdomen is nondistended, soft and nontender. No organomegaly or masses felt. Normal bowel sounds heard. Central nervous system: Alert and oriented. No focal neurological deficits. Extremities: Symmetric 5 x 5 power. Skin: No rashes, lesions or ulcers Psychiatry: Judgement and insight appear normal. Mood & affect appropriate.     Data Reviewed: I have personally reviewed following labs and imaging studies  CBC: Recent Labs  Lab 03/14/21 1531 03/15/21 0454 03/16/21 0352  WBC 10.8* 7.0 8.2  HGB 11.3* 10.4* 10.8*  HCT 34.1* 31.0* 32.5*  MCV 92.2 92.5 93.7  PLT 272 204 206   Basic Metabolic Panel: Recent Labs  Lab 03/14/21 1531 03/15/21 0454 03/16/21 0352  NA 137 135 140  K 2.8* 3.9 4.0  CL 103 105 113*  CO2 22 23 23   GLUCOSE 182* 110* 93  BUN 21 17 19   CREATININE 1.02* 0.90 0.86  CALCIUM 9.3 8.8* 8.6*  MG 2.1 2.1 2.1   GFR: Estimated Creatinine Clearance: 39.7 mL/min (by C-G formula based on SCr of 0.86 mg/dL). Liver Function Tests: Recent Labs  Lab 03/14/21 1531  AST 62*  ALT 44  ALKPHOS 80  BILITOT 0.9  PROT 6.9  ALBUMIN 4.1   No results for input(s): LIPASE, AMYLASE in the last 168 hours. No results for input(s): AMMONIA in the last 168 hours. Coagulation Profile: Recent Labs  Lab 03/14/21 1753  INR 1.0   Cardiac Enzymes: No results for input(s): CKTOTAL, CKMB, CKMBINDEX,  TROPONINI in the last 168 hours. BNP (last 3 results) No results for input(s): PROBNP in the last 8760 hours. HbA1C: No results for input(s): HGBA1C in the last 72 hours. CBG: No results for input(s): GLUCAP in the last 168 hours. Lipid Profile: No results for input(s): CHOL, HDL, LDLCALC, TRIG, CHOLHDL, LDLDIRECT in the last 72 hours. Thyroid Function Tests: No results for input(s): TSH, T4TOTAL, FREET4, T3FREE, THYROIDAB in the last 72 hours. Anemia Panel: No results for input(s): VITAMINB12, FOLATE, FERRITIN, TIBC, IRON, RETICCTPCT in the last 72 hours. Urine analysis: No results found for: COLORURINE, APPEARANCEUR, LABSPEC, PHURINE, GLUCOSEU, HGBUR, BILIRUBINUR, KETONESUR, PROTEINUR, UROBILINOGEN, NITRITE, LEUKOCYTESUR Sepsis Labs: @LABRCNTIP (procalcitonin:4,lacticidven:4)  ) Recent Results (from the past 240 hour(s))  Resp Panel by RT-PCR (Flu A&B, Covid) Nasopharyngeal Swab     Status: None   Collection Time: 03/14/21  3:31 PM   Specimen: Nasopharyngeal Swab; Nasopharyngeal(NP) swabs in vial transport medium  Result Value Ref Range  Status   SARS Coronavirus 2 by RT PCR NEGATIVE NEGATIVE Final    Comment: (NOTE) SARS-CoV-2 target nucleic acids are NOT DETECTED.  The SARS-CoV-2 RNA is generally detectable in upper respiratory specimens during the acute phase of infection. The lowest concentration of SARS-CoV-2 viral copies this assay can detect is 138 copies/mL. A negative result does not preclude SARS-Cov-2 infection and should not be used as the sole basis for treatment or other patient management decisions. A negative result may occur with  improper specimen collection/handling, submission of specimen other than nasopharyngeal swab, presence of viral mutation(s) within the areas targeted by this assay, and inadequate number of viral copies(<138 copies/mL). A negative result must be combined with clinical observations, patient history, and epidemiological information. The  expected result is Negative.  Fact Sheet for Patients:  BloggerCourse.com  Fact Sheet for Healthcare Providers:  SeriousBroker.it  This test is no t yet approved or cleared by the [place] FDA and  has been authorized for detection and/or diagnosis of SARS-CoV-2 by FDA under an Emergency Use Authorization (EUA). This EUA will remain  in effect (meaning this test can be used) for the duration of the COVID-19 declaration under Section 564(b)(1) of the Act, 21 U.S.C.section 360bbb-3(b)(1), unless the authorization is terminated  or revoked sooner.       Influenza A by PCR NEGATIVE NEGATIVE Final   Influenza B by PCR NEGATIVE NEGATIVE Final    Comment: (NOTE) The Xpert Xpress SARS-CoV-2/FLU/RSV plus assay is intended as an aid in the diagnosis of influenza from Nasopharyngeal swab specimens and should not be used as a sole basis for treatment. Nasal washings and aspirates are unacceptable for Xpert Xpress SARS-CoV-2/FLU/RSV testing.  Fact Sheet for Patients: BloggerCourse.com  Fact Sheet for Healthcare Providers: SeriousBroker.it  This test is not yet approved or cleared by the [place] FDA and has been authorized for detection and/or diagnosis of SARS-CoV-2 by FDA under an Emergency Use Authorization (EUA). This EUA will remain in effect (meaning this test can be used) for the duration of the COVID-19 declaration under Section 564(b)(1) of the Act, 21 U.S.C. section 360bbb-3(b)(1), unless the authorization is terminated or revoked.  Performed at [facility], [address], [place], [place] [address]          Radiology Studies: CT Head Wo Contrast  Result Date: 03/14/2021 CLINICAL DATA:  Syncopal episode EXAM: CT HEAD WITHOUT CONTRAST TECHNIQUE: Contiguous axial images were obtained from the base of the skull through the vertex without intravenous  contrast. COMPARISON:  None. FINDINGS: Brain: Ventricles are normal in size. No mass, hemorrhage, edema or other evidence of acute parenchymal abnormality. No extra-axial hemorrhage. Vascular: Chronic calcified atherosclerotic changes of the large vessels at the skull base. No unexpected hyperdense vessel. Skull: Normal. Negative for fracture or focal lesion. Sinuses/Orbits: No acute finding. Other: None. IMPRESSION: Negative head CT. No intracranial mass, hemorrhage or edema. Electronically Signed   By: Bary Richard M.D.   On: 03/14/2021 16:19   DG Chest Portable 1 View  Result Date: 03/14/2021 CLINICAL DATA:  Syncopal episode EXAM: PORTABLE CHEST 1 VIEW COMPARISON:  None. FINDINGS: Heart size is normal. There is atherosclerotic calcification of the aorta. Pulmonary vascularity is normal. The lungs are clear. No acute regional bone finding. IMPRESSION: No active disease. Aortic atherosclerosis. Electronically Signed   By: Paulina Fusi M.D.   On: 03/14/2021 17:34        Scheduled Meds: . aspirin EC  81 mg Oral Daily  . atorvastatin  80 mg Oral Daily  . losartan  50 mg Oral Daily  . metoprolol tartrate  25 mg Oral BID  . sodium chloride flush  3 mL Intravenous Q12H   Continuous Infusions: . heparin 700 Units/hr (03/16/21 0621)     LOS: 2 days    Time spent: 25 min    Silvano BilisNoah B Daylin Gruszka, MD [facility]   If 7PM-7AM, please contact night-coverage [email or web address] Password [email or web address] 03/16/2021, 8:06 AM

## 2021-03-16 NOTE — Consult Note (Signed)
ANTICOAGULATION CONSULT NOTE  Pharmacy Consult for heparin infusion Indication: chest pain/ACS  No Known Allergies  Patient Measurements: Height: 4\' 10"  (147.3 cm) Weight: 50 kg (110 lb 3.2 oz) IBW/kg (Calculated) : 40.9 Heparin Dosing Weight: 57.4 kg  Vital Signs: Temp: 97.8 F (36.6 C) (05/01 0453) Temp Source: Oral (05/01 0453) BP: 142/80 (05/01 0453) Pulse Rate: 61 (05/01 0453)  Labs: Recent Labs    03/14/21 1531 03/14/21 1703 03/14/21 1753 03/15/21 0454 03/15/21 1246 03/15/21 1651 03/16/21 0352 03/16/21 0437  HGB 11.3*  --   --  10.4*  --   --  10.8*  --   HCT 34.1*  --   --  31.0*  --   --  32.5*  --   PLT 272  --   --  204  --   --  206  --   APTT  --   --  24  --   --   --   --   --   LABPROT  --   --  13.6  --   --   --   --   --   INR  --   --  1.0  --   --   --   --   --   HEPARINUNFRC  --   --   --  0.62 0.37  --  0.34  --   CREATININE 1.02*  --   --  0.90  --   --  0.86  --   TROPONINIHS 996*   < >  --  1,288*  --  1,049*  --  1,047*   < > = values in this interval not displayed.    Estimated Creatinine Clearance: 39.7 mL/min (by C-G formula based on SCr of 0.86 mg/dL).   Medical History: Past Medical History:  Diagnosis Date  . Arthritis   . Headache   . Hyperlipidemia   . Hypertension     Medications:  No anticoagulation PTA noted   Assessment: 75 year old female with history of hypertension, hyperlipidemia, who is admitted with NSTEMI. Pharmacy has been consulted to initiate and manage heparin  infusion.   4/30:  HL @ 0454 = 0.62   Goal of Therapy:  Heparin level 0.3-0.7 units/ml Monitor platelets by anticoagulation protocol: Yes   Plan:  5/1:  HL @ 0352 = 0.34 Will continue pt on current rate and recheck HL on 5/2 with AM labs.   7/2, PharmD Clinical Pharmacist  03/16/2021,5:40 AM

## 2021-03-17 ENCOUNTER — Encounter: Payer: Self-pay | Admitting: Cardiology

## 2021-03-17 ENCOUNTER — Encounter
Admission: EM | Disposition: A | Discharge status: Home Health | Payer: Self-pay | Source: Home / Self Care | Attending: Obstetrics and Gynecology

## 2021-03-17 DIAGNOSIS — I214 Non-ST elevation (NSTEMI) myocardial infarction: Secondary | ICD-10-CM | POA: Diagnosis not present

## 2021-03-17 HISTORY — PX: LEFT HEART CATH AND CORONARY ANGIOGRAPHY: CATH118249

## 2021-03-17 LAB — BASIC METABOLIC PANEL
Anion gap: 6 (ref 5–15)
BUN: 24 mg/dL — ABNORMAL HIGH (ref 8–23)
CO2: 25 mmol/L (ref 22–32)
Calcium: 8.8 mg/dL — ABNORMAL LOW (ref 8.9–10.3)
Chloride: 108 mmol/L (ref 98–111)
Creatinine, Ser: 0.87 mg/dL (ref 0.44–1.00)
GFR, Estimated: >60 mL/min (ref >60)
Glucose, Bld: 94 mg/dL (ref 70–99)
Potassium: 3.9 mmol/L (ref 3.5–5.1)
Sodium: 139 mmol/L (ref 135–145)

## 2021-03-17 LAB — HEPARIN LEVEL (UNFRACTIONATED): Heparin Unfractionated: <0.1 IU/mL — ABNORMAL LOW (ref 0.30–0.70)

## 2021-03-17 LAB — MAGNESIUM: Magnesium: 1.9 mg/dL (ref 1.7–2.4)

## 2021-03-17 LAB — GLUCOSE, CAPILLARY: Glucose-Capillary: 102 mg/dL — ABNORMAL HIGH (ref 70–99)

## 2021-03-17 SURGERY — LEFT HEART CATH AND CORONARY ANGIOGRAPHY
Anesthesia: Moderate Sedation

## 2021-03-17 MED ORDER — SODIUM CHLORIDE 0.9 % IV SOLN: 250.0000 mL | INTRAVENOUS | Status: DC | PRN

## 2021-03-17 MED ORDER — ONDANSETRON HCL 4 MG/2ML IJ SOLN
4.0000 mg | Freq: Four times a day (QID) | INTRAMUSCULAR | Status: DC | PRN
  Administered 2021-03-17: 4 mg via INTRAVENOUS
  Filled 2021-03-17 (×2): qty 2

## 2021-03-17 MED ORDER — ACETAMINOPHEN 325 MG PO TABS
650.0000 mg | ORAL_TABLET | ORAL | Status: DC | PRN
  Administered 2021-03-17 – 2021-03-18 (×2): 650 mg via ORAL

## 2021-03-17 MED ORDER — IOHEXOL 300 MG/ML  SOLN
INTRAMUSCULAR | Status: DC | PRN
  Administered 2021-03-17: 74 mL

## 2021-03-17 MED ORDER — HEPARIN (PORCINE) IN NACL 1000-0.9 UT/500ML-% IV SOLN
INTRAVENOUS | Status: AC
  Filled 2021-03-17: qty 1000

## 2021-03-17 MED ORDER — FENTANYL CITRATE (PF) 100 MCG/2ML IJ SOLN
INTRAMUSCULAR | Status: DC | PRN
  Administered 2021-03-17: 25 ug via INTRAVENOUS

## 2021-03-17 MED ORDER — LABETALOL HCL 5 MG/ML IV SOLN: 10.0000 mg | INTRAVENOUS | Status: AC | PRN

## 2021-03-17 MED ORDER — HEPARIN SODIUM (PORCINE) 1000 UNIT/ML IJ SOLN
INTRAMUSCULAR | Status: DC | PRN
  Administered 2021-03-17: 3000 [IU] via INTRAVENOUS

## 2021-03-17 MED ORDER — MIDAZOLAM HCL 2 MG/2ML IJ SOLN
INTRAMUSCULAR | Status: AC
  Filled 2021-03-17: qty 2

## 2021-03-17 MED ORDER — HYDRALAZINE HCL 20 MG/ML IJ SOLN: 10.0000 mg | INTRAMUSCULAR | Status: AC | PRN

## 2021-03-17 MED ORDER — SODIUM CHLORIDE 0.9% FLUSH
3.0000 mL | Freq: Two times a day (BID) | INTRAVENOUS | Status: DC
  Administered 2021-03-18 – 2021-03-19 (×3): 3 mL via INTRAVENOUS

## 2021-03-17 MED ORDER — VERAPAMIL HCL 2.5 MG/ML IV SOLN
INTRAVENOUS | Status: DC | PRN
  Administered 2021-03-17: 2.5 mg via INTRA_ARTERIAL

## 2021-03-17 MED ORDER — SODIUM CHLORIDE 0.9 % WEIGHT BASED INFUSION: 1.0000 mL/kg/h | INTRAVENOUS | Status: AC

## 2021-03-17 MED ORDER — HEPARIN (PORCINE) IN NACL 2000-0.9 UNIT/L-% IV SOLN
INTRAVENOUS | Status: DC | PRN
  Administered 2021-03-17: 1000 mL

## 2021-03-17 MED ORDER — VERAPAMIL HCL 2.5 MG/ML IV SOLN
INTRAVENOUS | Status: AC
  Filled 2021-03-17: qty 2

## 2021-03-17 MED ORDER — ASPIRIN 81 MG PO CHEW
81.0000 mg | CHEWABLE_TABLET | Freq: Every day | ORAL | Status: DC
  Filled 2021-03-17: qty 1

## 2021-03-17 MED ORDER — ONDANSETRON HCL 4 MG/2ML IJ SOLN
INTRAMUSCULAR | Status: AC
  Administered 2021-03-17: 4 mg via INTRAVENOUS
  Filled 2021-03-17: qty 2

## 2021-03-17 MED ORDER — HEPARIN SODIUM (PORCINE) 1000 UNIT/ML IJ SOLN
INTRAMUSCULAR | Status: AC
  Filled 2021-03-17: qty 1

## 2021-03-17 MED ORDER — FENTANYL CITRATE (PF) 100 MCG/2ML IJ SOLN
INTRAMUSCULAR | Status: AC
  Filled 2021-03-17: qty 2

## 2021-03-17 MED ORDER — HEPARIN BOLUS VIA INFUSION
1500.0000 [IU] | Freq: Once | INTRAVENOUS | Status: AC
  Administered 2021-03-17: 1500 [IU] via INTRAVENOUS
  Filled 2021-03-17: qty 1500

## 2021-03-17 MED ORDER — SODIUM CHLORIDE 0.9% FLUSH: 3.0000 mL | INTRAVENOUS | Status: DC | PRN

## 2021-03-17 MED ORDER — CLOPIDOGREL BISULFATE 75 MG PO TABS
75.0000 mg | ORAL_TABLET | Freq: Every day | ORAL | Status: DC
  Administered 2021-03-18 – 2021-03-19 (×2): 75 mg via ORAL
  Filled 2021-03-17 (×2): qty 1

## 2021-03-17 MED ORDER — MIDAZOLAM HCL 2 MG/2ML IJ SOLN
INTRAMUSCULAR | Status: DC | PRN
  Administered 2021-03-17: 1 mg via INTRAVENOUS

## 2021-03-17 SURGICAL SUPPLY — 17 items
CATH INFINITI 5 FR JL3.5 (CATHETERS) ×2 IMPLANT
CATH INFINITI 5FR ANG PIGTAIL (CATHETERS) ×2 IMPLANT
CATH INFINITI 5FR JL4 (CATHETERS) ×2 IMPLANT
CATH INFINITI JR4 5F (CATHETERS) ×2 IMPLANT
DEVICE RAD TR BAND REGULAR (VASCULAR PRODUCTS) ×2 IMPLANT
DRAPE BRACHIAL (DRAPES) ×2 IMPLANT
GLIDESHEATH SLEND SS 6F .021 (SHEATH) ×2 IMPLANT
GUIDEWIRE INQWIRE 1.5J.035X260 (WIRE) ×1 IMPLANT
INQWIRE 1.5J .035X260CM (WIRE) ×2
KIT SYRINGE INJ CVI SPIKEX1 (MISCELLANEOUS) ×2 IMPLANT
PACK CARDIAC CATH (CUSTOM PROCEDURE TRAY) ×2 IMPLANT
PROTECTION STATION PRESSURIZED (MISCELLANEOUS) ×2
SET ATX SIMPLICITY (MISCELLANEOUS) ×2 IMPLANT
SHEATH 6FR 75 DEST SLENDER (SHEATH) ×2 IMPLANT
STATION PROTECTION PRESSURIZED (MISCELLANEOUS) ×1 IMPLANT
WIRE GUIDERIGHT .035X150 (WIRE) ×4 IMPLANT
WIRE HITORQ VERSACORE ST 145CM (WIRE) ×2 IMPLANT

## 2021-03-17 NOTE — Care Management Important Message (Signed)
Important Message  Patient Details  Name: Victoria [name] MRN: [id number] Date of Birth: [birth year]   Medicare Important Message Given:  Yes     Johnell Comings 03/17/2021, 11:59 AM

## 2021-03-17 NOTE — Progress Notes (Signed)
PT Cancellation Note  Patient Details Name: Victoria [name] MRN: [id number] DOB: Apr 19, 1946   Cancelled Treatment:    Reason Eval/Treat Not Completed: Other (comment). Pt currently out of room for cardiac cath with possible intervention. Will re-attempt next date. May need new orders pending status change and use of anesthesia.   Damek Ende 03/17/2021, 10:16 AM  Elizabeth Palau, PT, DPT [phone]

## 2021-03-17 NOTE — Consult Note (Signed)
ANTICOAGULATION CONSULT NOTE  Pharmacy Consult for heparin infusion Indication: chest pain/ACS  No Known Allergies  Patient Measurements: Height: 4\' 10"  (147.3 cm) Weight: 48.9 kg (107 lb 12.9 oz) IBW/kg (Calculated) : 40.9 Heparin Dosing Weight: 57.4 kg  Vital Signs: Temp: 99.1 F (37.3 C) (05/02 0409) Temp Source: Oral (05/02 0409) BP: 126/70 (05/02 0409) Pulse Rate: 72 (05/01 1934)  Labs: Recent Labs    03/14/21 1531 03/14/21 1531 03/14/21 1703 03/14/21 1753 03/15/21 0454 03/15/21 1246 03/15/21 1651 03/16/21 0352 03/16/21 0437 03/17/21 0537  HGB 11.3*  --   --   --  10.4*  --   --  10.8*  --   --   HCT 34.1*  --   --   --  31.0*  --   --  32.5*  --   --   PLT 272  --   --   --  204  --   --  206  --   --   APTT  --   --   --  24  --   --   --   --   --   --   LABPROT  --   --   --  13.6  --   --   --   --   --   --   INR  --   --   --  1.0  --   --   --   --   --   --   HEPARINUNFRC  --    < >  --   --  0.62 0.37  --  0.34  --  <0.10*  CREATININE 1.02*  --   --   --  0.90  --   --  0.86  --  0.87  TROPONINIHS 996*  --    < >  --  1,288*  --  1,049*  --  1,047*  --    < > = values in this interval not displayed.    Estimated Creatinine Clearance: 36.1 mL/min (by C-G formula based on SCr of 0.87 mg/dL).   Medical History: Past Medical History:  Diagnosis Date  . Arthritis   . Headache   . Hyperlipidemia   . Hypertension     Medications:  No anticoagulation PTA noted   Assessment: 75 year old female with history of hypertension, hyperlipidemia, who is admitted with NSTEMI. Pharmacy has been consulted to initiate and manage heparin  infusion.   4/30:  HL @ 0454 = 0.62   Goal of Therapy:  Heparin level 0.3-0.7 units/ml Monitor platelets by anticoagulation protocol: Yes   Plan:  5/1:  HL @ 0352 = 0.34 Will continue pt on current rate and recheck HL on 5/2 with AM labs.   5/2: HL @ 0537 = < 0.1 Will order heparin 1500 units IV X 1 and increase  drip rate to 900 units/hr.  Will recheck HL 8 hrs after rate change.    7/2, PharmD Clinical Pharmacist  03/17/2021,7:05 AM

## 2021-03-17 NOTE — Progress Notes (Signed)
PROGRESS NOTE    [name]  RUE:[id number] DOB: 09-Apr-1946 DOA: 03/14/2021 PCP: Dorothey Baseman, MD  Outpatient Specialists: none    Brief Narrative:   Victoria [name] is a 75 y.o. female with medical history significant for hypertension, hyperlipidemia who is admitted to [facility] [place] on 03/14/2021 with NSTEMI after presenting from home to [facility] ED for evaluation of syncope.   The patient reportedly was at home earlier this afternoon when she leaned forward to pick up something located on her near the floor at which time she experienced consciousness that was witnessed by her family, who reported that the patient fell straight forward, hitting her head on the floor.  At that time, she was completely unconscious and nonresponsive per her family, who did not reportedly initiated 1 to 2 minutes of CPR while contacting EMS.  Socially, patient brought to [facility] ED via EMS, and received a full dose aspirin in route.  Provide discussions with the patient, she denies any prior syncopal event similar to the above.  She denies any prodrome leading up to her loss of consciousness.  She reports that she was in her baseline state of health today leading up to the syncopal event.  She denies any recent or subsequent chest pain.  Additionally she denies any recent shortness of breath, palpitations, diaphoresis, nausea, vomiting.  No preceding trauma.  Denies any recent wheezing, cough, hemoptysis, lower extremity erythema, Howerter new onset calf tenderness.  She also denies any recent orthopnea, PND, or peripheral edema.  No subjective fever, chills, rigors, or generalized myalgias.  No recent travel.  She denies any known history of underlying coronary artery disease.  Acknowledges a history of hypertension for which she reports good compliance with her outpatient losartan and HCTZ.  Also acknowledges a history of hyperlipidemia that she reports is managed via lifestyle modifications, and  is not currently on a statin medication at home.  Denies any known history of underlying diabetes, and reports that she is a lifelong non-smoker.  No known family history of premature coronary artery disease.    Assessment & Plan:   Principal Problem:   NSTEMI (non-ST elevated myocardial infarction) [facility]) Active Problems:   Syncope   Hypokalemia   Hypertension   Hyperlipidemia  # NSTEMI # CAD No hx cad. Trop plateau 1288. Chest pain free. Cardiology following. LHC today showing one-vessel cad w/ occluded proximal rca as well as calcified stenosis in the mid-lad and proximal om1. TTE unremarkable - cardiology advising medical mgmt w/ asa, plavix, metop, statin - ok to d/c but is a bit delirious after sedation, will monitor and re-assess later this afternoon  # Syncope Likely 2/2 nstemi. Fell backwards and hit head. No bruise or laceration, ct neg. Mild ha currently, no focal neurologic deficits. No recurrence - monitor tele  # Hypokalemia Likely 2/2 hctz which has been held. Repleted, k wnl, mg wnl. - monitor  # Essential hypertension: bp wnl now - on losart/hctz at home. Have added back losartan, hctz held. - metoprolol started    DVT prophylaxis: heparin (discontinuing this pm) Code Status: full Family Communication: son updated telephonically 5/2  Level of care: Progressive Cardiac Status is: Inpatient  Remains inpatient appropriate because:Inpatient level of care appropriate due to severity of illness   Dispo: The patient is from: Home              Anticipated d/c is to: Home  Patient currently is not medically stable to d/c.   Difficult to place patient No      Consultants:  cardiology  Procedures: none  Antimicrobials:  none    Subjective: Pos procedure confused, feeling slightly nauseaus. No chest pain  Objective: Vitals:   03/17/21 1130 03/17/21 1200 03/17/21 1230 03/17/21 1253  BP: 139/73 (!) 121/58 129/70 123/64  Pulse: 82  69  72  Resp: 16 (!) 28 17 16   Temp:    98 F (36.7 C)  TempSrc:    Oral  SpO2: 99% 99%    Weight:      Height:        Intake/Output Summary (Last 24 hours) at 03/17/2021 1304 Last data filed at 03/16/2021 2120 Gross per 24 hour  Intake 150 ml  Output 1000 ml  Net -850 ml   Filed Weights   03/16/21 0907 03/17/21 0500 03/17/21 0817  Weight: 48.7 kg 48.9 kg 48.9 kg    Examination:  General exam: mild distress  Respiratory system: Clear to auscultation. Respiratory effort normal. Cardiovascular system: S1 & S2 heard, RRR. No JVD, murmurs, rubs, gallops or clicks. No pedal edema. Gastrointestinal system: Abdomen is nondistended, soft and nontender. No organomegaly or masses felt. Normal bowel sounds heard. Central nervous system: Alert and oriented. No focal neurological deficits. Extremities: Symmetric 5 x 5 power. Skin: No rashes, lesions or ulcers Psychiatry: appears confused     Data Reviewed: I have personally reviewed following labs and imaging studies  CBC: Recent Labs  Lab 03/14/21 1531 03/15/21 0454 03/16/21 0352  WBC 10.8* 7.0 8.2  HGB 11.3* 10.4* 10.8*  HCT 34.1* 31.0* 32.5*  MCV 92.2 92.5 93.7  PLT 272 204 206   Basic Metabolic Panel: Recent Labs  Lab 03/14/21 1531 03/15/21 0454 03/16/21 0352 03/17/21 0537  NA 137 135 140 139  K 2.8* 3.9 4.0 3.9  CL 103 105 113* 108  CO2 22 23 23 25   GLUCOSE 182* 110* 93 94  BUN 21 17 19  24*  CREATININE 1.02* 0.90 0.86 0.87  CALCIUM 9.3 8.8* 8.6* 8.8*  MG 2.1 2.1 2.1 1.9   GFR: Estimated Creatinine Clearance: 36.1 mL/min (by C-G formula based on SCr of 0.87 mg/dL). Liver Function Tests: Recent Labs  Lab 03/14/21 1531  AST 62*  ALT 44  ALKPHOS 80  BILITOT 0.9  PROT 6.9  ALBUMIN 4.1   No results for input(s): LIPASE, AMYLASE in the last 168 hours. No results for input(s): AMMONIA in the last 168 hours. Coagulation Profile: Recent Labs  Lab 03/14/21 1753  INR 1.0   Cardiac Enzymes: No results  for input(s): CKTOTAL, CKMB, CKMBINDEX, TROPONINI in the last 168 hours. BNP (last 3 results) No results for input(s): PROBNP in the last 8760 hours. HbA1C: No results for input(s): HGBA1C in the last 72 hours. CBG: Recent Labs  Lab 03/17/21 1132  GLUCAP 102*   Lipid Profile: No results for input(s): CHOL, HDL, LDLCALC, TRIG, CHOLHDL, LDLDIRECT in the last 72 hours. Thyroid Function Tests: No results for input(s): TSH, T4TOTAL, FREET4, T3FREE, THYROIDAB in the last 72 hours. Anemia Panel: No results for input(s): VITAMINB12, FOLATE, FERRITIN, TIBC, IRON, RETICCTPCT in the last 72 hours. Urine analysis: No results found for: COLORURINE, APPEARANCEUR, LABSPEC, PHURINE, GLUCOSEU, HGBUR, BILIRUBINUR, KETONESUR, PROTEINUR, UROBILINOGEN, NITRITE, LEUKOCYTESUR Sepsis Labs: @LABRCNTIP (procalcitonin:4,lacticidven:4)  ) Recent Results (from the past 240 hour(s))  Resp Panel by RT-PCR (Flu A&B, Covid) Nasopharyngeal Swab     Status: None   Collection Time: 03/14/21  3:31  PM   Specimen: Nasopharyngeal Swab; Nasopharyngeal(NP) swabs in vial transport medium  Result Value Ref Range Status   SARS Coronavirus 2 by RT PCR NEGATIVE NEGATIVE Final    Comment: (NOTE) SARS-CoV-2 target nucleic acids are NOT DETECTED.  The SARS-CoV-2 RNA is generally detectable in upper respiratory specimens during the acute phase of infection. The lowest concentration of SARS-CoV-2 viral copies this assay can detect is 138 copies/mL. A negative result does not preclude SARS-Cov-2 infection and should not be used as the sole basis for treatment or other patient management decisions. A negative result may occur with  improper specimen collection/handling, submission of specimen other than nasopharyngeal swab, presence of viral mutation(s) within the areas targeted by this assay, and inadequate number of viral copies(<138 copies/mL). A negative result must be combined with clinical observations, patient history,  and epidemiological information. The expected result is Negative.  Fact Sheet for Patients:  BloggerCourse.com  Fact Sheet for Healthcare Providers:  SeriousBroker.it  This test is no t yet approved or cleared by the [place] FDA and  has been authorized for detection and/or diagnosis of SARS-CoV-2 by FDA under an Emergency Use Authorization (EUA). This EUA will remain  in effect (meaning this test can be used) for the duration of the COVID-19 declaration under Section 564(b)(1) of the Act, 21 U.S.C.section 360bbb-3(b)(1), unless the authorization is terminated  or revoked sooner.       Influenza A by PCR NEGATIVE NEGATIVE Final   Influenza B by PCR NEGATIVE NEGATIVE Final    Comment: (NOTE) The Xpert Xpress SARS-CoV-2/FLU/RSV plus assay is intended as an aid in the diagnosis of influenza from Nasopharyngeal swab specimens and should not be used as a sole basis for treatment. Nasal washings and aspirates are unacceptable for Xpert Xpress SARS-CoV-2/FLU/RSV testing.  Fact Sheet for Patients: BloggerCourse.com  Fact Sheet for Healthcare Providers: SeriousBroker.it  This test is not yet approved or cleared by the [place] FDA and has been authorized for detection and/or diagnosis of SARS-CoV-2 by FDA under an Emergency Use Authorization (EUA). This EUA will remain in effect (meaning this test can be used) for the duration of the COVID-19 declaration under Section 564(b)(1) of the Act, 21 U.S.C. section 360bbb-3(b)(1), unless the authorization is terminated or revoked.  Performed at [facility], [address], [place], [place] [address]          Radiology Studies: CARDIAC CATHETERIZATION  Result Date: 03/17/2021  Prox RCA lesion is 100% stenosed.  Mid LAD lesion is 50% stenosed.  Prox LAD to Mid LAD lesion is 60% stenosed.  1st Diag lesion is 50%  stenosed.  Prox Cx lesion is 50% stenosed.  1st Mrg lesion is 60% stenosed.  1.  Non-ST elevation myocardial infarction 2.  One-vessel coronary artery disease with occluded proximal RCA with left-to-right collaterals, calcified 60% stenosis mid LAD, 60% stenosis proximal OM1 Recommendations 1.  Medical therapy, add Plavix 75 mg daily 2.  Aggressive risk factor modification 3.  Follow-up in 1 week   ECHOCARDIOGRAM COMPLETE  Result Date: 03/16/2021    ECHOCARDIOGRAM REPORT   Patient Name:   EMMABELLE FEAR Date of Exam: 03/15/2021 Medical Rec #:  [id number]      Height:       58.0 in Accession #:    [id number]     Weight:       110.2 lb Date of Birth:  02-Feb-1946      BSA:          1.414  m Patient Age:    74 years       BP:           148/75 mmHg Patient Gender: F              HR:           65 bpm. Exam Location:  [facility] Procedure: 2D Echo, Cardiac Doppler and Color Doppler Indications:     Acute myocardial infarction, unspecified I21.9  History:         Patient has no prior history of Echocardiogram examinations.                  Arrythmias:Atrial Fibrillation; Risk Factors:Hypertension.  Sonographer:     Neysa Bonitohristy Roar Referring Phys:  [id number] Angie FavaJUSTIN B HOWERTER Diagnosing Phys: Marcina MillardAlexander Paraschos MD IMPRESSIONS  1. Left ventricular ejection fraction, by estimation, is 50 to 55%. The left ventricle has low normal function. The left ventricle demonstrates regional wall motion abnormalities (see scoring diagram/findings for description). Left ventricular diastolic  parameters were normal.  2. Right ventricular systolic function is normal. The right ventricular size is normal.  3. The mitral valve is normal in structure. Mild mitral valve regurgitation. No evidence of mitral stenosis.  4. Tricuspid valve regurgitation is mild to moderate.  5. The aortic valve is normal in structure. Aortic valve regurgitation is mild to moderate. No aortic stenosis is present.  6. The inferior vena cava is normal in size with greater than  50% respiratory variability, suggesting right atrial pressure of 3 mmHg. FINDINGS  Left Ventricle: Left ventricular ejection fraction, by estimation, is 50 to 55%. The left ventricle has low normal function. The left ventricle demonstrates regional wall motion abnormalities. The left ventricular internal cavity size was normal in size. There is no left ventricular hypertrophy. Left ventricular diastolic parameters were normal.  LV Wall Scoring: The apex is hypokinetic. Right Ventricle: The right ventricular size is normal. No increase in right ventricular wall thickness. Right ventricular systolic function is normal. Left Atrium: Left atrial size was normal in size. Right Atrium: Right atrial size was normal in size. Pericardium: There is no evidence of pericardial effusion. Mitral Valve: The mitral valve is normal in structure. Mild mitral valve regurgitation. No evidence of mitral valve stenosis. Tricuspid Valve: The tricuspid valve is normal in structure. Tricuspid valve regurgitation is mild to moderate. No evidence of tricuspid stenosis. Aortic Valve: The aortic valve is normal in structure. Aortic valve regurgitation is mild to moderate. No aortic stenosis is present. Aortic valve peak gradient measures 5.2 mmHg. Pulmonic Valve: The pulmonic valve was normal in structure. Pulmonic valve regurgitation is not visualized. No evidence of pulmonic stenosis. Aorta: The aortic root is normal in size and structure. Venous: The inferior vena cava is normal in size with greater than 50% respiratory variability, suggesting right atrial pressure of 3 mmHg. IAS/Shunts: No atrial level shunt detected by color flow Doppler.  LEFT VENTRICLE PLAX 2D LVIDd:         3.61 cm  Diastology LVIDs:         2.73 cm  LV e' medial:    7.94 cm/s LV PW:         0.86 cm  LV E/e' medial:  12.6 LV IVS:        1.04 cm  LV e' lateral:   11.50 cm/s LVOT diam:     1.60 cm  LV E/e' lateral: 8.7 LVOT Area:     2.01 cm  RIGHT VENTRICLE RV Mid  diam:     2.91 cm RV S prime:     10.70 cm/s TAPSE (M-mode): 1.2 cm LEFT ATRIUM             Index       RIGHT ATRIUM           Index LA diam:        3.70 cm 2.62 cm/m  RA Area:     10.40 cm LA Vol (A2C):   57.0 ml 40.32 ml/m RA Volume:   20.80 ml  14.71 ml/m LA Vol (A4C):   47.7 ml 33.74 ml/m LA Biplane Vol: 54.3 ml 38.41 ml/m  AORTIC VALVE                PULMONIC VALVE AV Area (Vmax): 1.98 cm    PV Vmax:        0.66 m/s AV Vmax:        114.00 cm/s PV Peak grad:   1.8 mmHg AV Peak Grad:   5.2 mmHg    RVOT Peak grad: 1 mmHg LVOT Vmax:      112.00 cm/s  AORTA Ao Root diam: 2.90 cm MITRAL VALVE                TRICUSPID VALVE MV Area (PHT): 4.36 cm     TR Peak grad:   21.0 mmHg MV Decel Time: 174 msec     TR Vmax:        229.00 cm/s MV E velocity: 100.00 cm/s MV A velocity: 104.00 cm/s  SHUNTS MV E/A ratio:  0.96         Systemic Diam: 1.60 cm MV A Prime:    10.4 cm/s Marcina Millard MD Electronically signed by Marcina Millard MD Signature Date/Time: 03/16/2021/10:06:17 AM    Final         Scheduled Meds: . aspirin  81 mg Oral Daily  . aspirin EC  81 mg Oral Daily  . atorvastatin  80 mg Oral Daily  . [START ON 03/18/2021] clopidogrel  75 mg Oral Q breakfast  . losartan  50 mg Oral Daily  . metoprolol tartrate  25 mg Oral BID  . sodium chloride flush  3 mL Intravenous Q12H  . sodium chloride flush  3 mL Intravenous Q12H   Continuous Infusions: . sodium chloride    . sodium chloride    . heparin 900 Units/hr (03/17/21 0709)     LOS: 3 days    Time spent: 25 min    Silvano Bilis, MD [facility]   If 7PM-7AM, please contact night-coverage [email or web address] Password [email or web address] 03/17/2021, 1:04 PM

## 2021-03-18 ENCOUNTER — Inpatient Hospital Stay: Payer: Medicare PPO

## 2021-03-18 ENCOUNTER — Encounter: Payer: Self-pay | Admitting: Internal Medicine

## 2021-03-18 DIAGNOSIS — I214 Non-ST elevation (NSTEMI) myocardial infarction: Secondary | ICD-10-CM | POA: Diagnosis not present

## 2021-03-18 LAB — BASIC METABOLIC PANEL
Anion gap: 12 (ref 5–15)
BUN: 22 mg/dL (ref 8–23)
CO2: 21 mmol/L — ABNORMAL LOW (ref 22–32)
Calcium: 8.9 mg/dL (ref 8.9–10.3)
Chloride: 105 mmol/L (ref 98–111)
Creatinine, Ser: 0.82 mg/dL (ref 0.44–1.00)
GFR, Estimated: >60 mL/min (ref >60)
Glucose, Bld: 109 mg/dL — ABNORMAL HIGH (ref 70–99)
Potassium: 3.6 mmol/L (ref 3.5–5.1)
Sodium: 138 mmol/L (ref 135–145)

## 2021-03-18 LAB — MAGNESIUM: Magnesium: 2.2 mg/dL (ref 1.7–2.4)

## 2021-03-18 MED ORDER — ENOXAPARIN SODIUM 40 MG/0.4ML IJ SOSY
40.0000 mg | PREFILLED_SYRINGE | INTRAMUSCULAR | Status: DC
  Administered 2021-03-19: 40 mg via SUBCUTANEOUS
  Filled 2021-03-18: qty 0.4

## 2021-03-18 MED ORDER — ATORVASTATIN CALCIUM 80 MG PO TABS: 80.0000 mg | ORAL_TABLET | Freq: Every day | ORAL | 1 refills | Status: AC

## 2021-03-18 MED ORDER — CLOPIDOGREL BISULFATE 75 MG PO TABS: 75.0000 mg | ORAL_TABLET | Freq: Every day | ORAL | 1 refills | Status: DC

## 2021-03-18 MED ORDER — METOPROLOL TARTRATE 25 MG PO TABS: 25.0000 mg | ORAL_TABLET | Freq: Two times a day (BID) | ORAL | 1 refills | Status: DC

## 2021-03-18 MED ORDER — ENOXAPARIN SODIUM 40 MG/0.4ML IJ SOSY: 40.0000 mg | PREFILLED_SYRINGE | INTRAMUSCULAR | Status: DC

## 2021-03-18 MED ORDER — LOSARTAN POTASSIUM 50 MG PO TABS: 50.0000 mg | ORAL_TABLET | Freq: Every day | ORAL | 1 refills | Status: AC

## 2021-03-18 MED ORDER — IOHEXOL 350 MG/ML SOLN
75.0000 mL | Freq: Once | INTRAVENOUS | Status: AC | PRN
  Administered 2021-03-18: 75 mL via INTRAVENOUS

## 2021-03-18 NOTE — Progress Notes (Addendum)
PROGRESS NOTE    Victoria [name]  RSW:[id number] DOB: 05/23/1946 DOA: 03/14/2021 PCP: Dorothey Baseman, MD  Outpatient Specialists: none    Brief Narrative:   Victoria [name] is a 75 y.o. female with medical history significant for hypertension, hyperlipidemia who is admitted to [facility] on 03/14/2021 with NSTEMI after presenting from home to [facility] ED for evaluation of syncope.   The patient reportedly was at home earlier this afternoon when she leaned forward to pick up something located on her near the floor at which time she experienced consciousness that was witnessed by her family, who reported that the patient fell straight forward, hitting her head on the floor.  At that time, she was completely unconscious and nonresponsive per her family, who did not reportedly initiated 1 to 2 minutes of CPR while contacting EMS.  Socially, patient brought to [facility] ED via EMS, and received a full dose aspirin in route.  Provide discussions with the patient, she denies any prior syncopal event similar to the above.  She denies any prodrome leading up to her loss of consciousness.  She reports that she was in her baseline state of health today leading up to the syncopal event.  She denies any recent or subsequent chest pain.  Additionally she denies any recent shortness of breath, palpitations, diaphoresis, nausea, vomiting.  No preceding trauma.  Denies any recent wheezing, cough, hemoptysis, lower extremity erythema, [place] new onset calf tenderness.  She also denies any recent orthopnea, PND, or peripheral edema.  No subjective fever, chills, rigors, or generalized myalgias.  No recent travel.  She denies any known history of underlying coronary artery disease.  Acknowledges a history of hypertension for which she reports good compliance with her outpatient losartan and HCTZ.  Also acknowledges a history of hyperlipidemia that she reports is managed via lifestyle modifications, and  is not currently on a statin medication at home.  Denies any known history of underlying diabetes, and reports that she is a lifelong non-smoker.  No known family history of premature coronary artery disease.    Assessment & Plan:   Principal Problem:   NSTEMI (non-ST elevated myocardial infarction) [facility]) Active Problems:   Syncope   Hypokalemia   Hypertension   Hyperlipidemia  # Acute CVA Some delirium post-cath and today balance issues, mild rue weakness. MRI shows infarcts to left parietal lobe, medial right thalamus, left cerebellar hemisphere. Discussed w/ neurology, thinks likely 2/2 yesterday's cath. Reassessed at bedside, stable. Eating lunch without difficulty. - CTA head/neck - consider statin, aspirin, plavix - slp eval - q4 neuro checks - maintain telemetry - will discuss w/ cardiology, consider TEE though think thrombus unlikely (nothing seen on TTE, no LA enlargement)  # Questionable PE Radiology called regarding CTA (formal read pending) saying lung windows shows wedge-shaped possible infarcts suspicious (but not diagnostic) for PE. Discussed w/ neurology, given risk of hemorrhagic transformation best to avoid anticoagulation for at least another 24 hours. As patient is hemodynamically stable w/o hypoxia or respiratory distress, will thus hold on anticoagulation. Want to hold on CTA of chest as has now had significant contrast exposure (LHC yesterday, CTA of head/neck today) - will check lower extremity dopplers to eval for DVT - if becomes symptomatic of PE overnight neurology advises low-goal, no-bolus heparin instructions to pharmacy - consider vq scan vs CTA of chest tomorrow  # NSTEMI # CAD No hx cad. Trop plateau 1288. Chest pain free. Cardiology following. LHC showing one-vessel cad w/ occluded proximal  rca as well as calcified stenosis in the mid-lad and proximal om1. TTE unremarkable - cardiology advising medical mgmt w/ asa, plavix, metop, statin  #  Syncope Likely 2/2 nstemi. Fell backwards and hit head. No bruise or laceration, ct neg. Mild ha currently, no focal neurologic deficits. No recurrence - monitor tele  # Hypokalemia Likely 2/2 hctz which has been held. Repleted, k wnl, mg wnl. - monitor  # Essential hypertension: bp wnl now - on losart/hctz at home. Have added back losartan, hctz held. - metoprolol started    DVT prophylaxis: lovenox Code Status: full Family Communication: son updated @ bedside 5/3  Level of care: Progressive Cardiac Status is: Inpatient  Remains inpatient appropriate because:Inpatient level of care appropriate due to severity of illness   Dispo: The patient is from: Home              Anticipated d/c is to: Home              Patient currently is not medically stable to d/c.   Difficult to place patient No      Consultants:  cardiology  Procedures: none  Antimicrobials:  none    Subjective: Has appetite. No chest pain  Objective: Vitals:   03/18/21 0534 03/18/21 0938 03/18/21 1150 03/18/21 1549  BP: (!) 145/69  (!) 142/76 (!) 149/73  Pulse: 73  64 68  Resp: 17  18 18   Temp: 98 F (36.7 C)  97.8 F (36.6 C) 97.9 F (36.6 C)  TempSrc: Oral     SpO2: 100%  100% 98%  Weight:  48.3 kg    Height:        Intake/Output Summary (Last 24 hours) at 03/18/2021 1640 Last data filed at 03/18/2021 1548 Gross per 24 hour  Intake 360 ml  Output 200 ml  Net 160 ml   Filed Weights   03/17/21 0817 03/18/21 0500 03/18/21 0938  Weight: 48.9 kg 48.5 kg 48.3 kg    Examination:  General exam: mild distress  Respiratory system: Clear to auscultation. Respiratory effort normal. Cardiovascular system: S1 & S2 heard, RRR. No JVD, murmurs, rubs, gallops or clicks. No pedal edema. Gastrointestinal system: Abdomen is nondistended, soft and nontender. No organomegaly or masses felt. Normal bowel sounds heard. Central nervous system: cn2-12 grossly intact. Mild weakness RUE.  Extremities:  Symmetric 5 x 5 power. Skin: No rashes, lesions or ulcers Psychiatry: appears confused     Data Reviewed: I have personally reviewed following labs and imaging studies  CBC: Recent Labs  Lab 03/14/21 1531 03/15/21 0454 03/16/21 0352  WBC 10.8* 7.0 8.2  HGB 11.3* 10.4* 10.8*  HCT 34.1* 31.0* 32.5*  MCV 92.2 92.5 93.7  PLT 272 204 206   Basic Metabolic Panel: Recent Labs  Lab 03/14/21 1531 03/15/21 0454 03/16/21 0352 03/17/21 0537 03/18/21 0548  NA 137 135 140 139 138  K 2.8* 3.9 4.0 3.9 3.6  CL 103 105 113* 108 105  CO2 22 23 23 25  21*  GLUCOSE 182* 110* 93 94 109*  BUN 21 17 19  24* 22  CREATININE 1.02* 0.90 0.86 0.87 0.82  CALCIUM 9.3 8.8* 8.6* 8.8* 8.9  MG 2.1 2.1 2.1 1.9 2.2   GFR: Estimated Creatinine Clearance: 38.3 mL/min (by C-G formula based on SCr of 0.82 mg/dL). Liver Function Tests: Recent Labs  Lab 03/14/21 1531  AST 62*  ALT 44  ALKPHOS 80  BILITOT 0.9  PROT 6.9  ALBUMIN 4.1   No results for input(s): LIPASE, AMYLASE  in the last 168 hours. No results for input(s): AMMONIA in the last 168 hours. Coagulation Profile: Recent Labs  Lab 03/14/21 1753  INR 1.0   Cardiac Enzymes: No results for input(s): CKTOTAL, CKMB, CKMBINDEX, TROPONINI in the last 168 hours. BNP (last 3 results) No results for input(s): PROBNP in the last 8760 hours. HbA1C: No results for input(s): HGBA1C in the last 72 hours. CBG: Recent Labs  Lab 03/17/21 1132  GLUCAP 102*   Lipid Profile: No results for input(s): CHOL, HDL, LDLCALC, TRIG, CHOLHDL, LDLDIRECT in the last 72 hours. Thyroid Function Tests: No results for input(s): TSH, T4TOTAL, FREET4, T3FREE, THYROIDAB in the last 72 hours. Anemia Panel: No results for input(s): VITAMINB12, FOLATE, FERRITIN, TIBC, IRON, RETICCTPCT in the last 72 hours. Urine analysis: No results found for: COLORURINE, APPEARANCEUR, LABSPEC, PHURINE, GLUCOSEU, HGBUR, BILIRUBINUR, KETONESUR, PROTEINUR, UROBILINOGEN, NITRITE,  LEUKOCYTESUR Sepsis Labs: @LABRCNTIP (procalcitonin:4,lacticidven:4)  ) Recent Results (from the past 240 hour(s))  Resp Panel by RT-PCR (Flu A&B, Covid) Nasopharyngeal Swab     Status: None   Collection Time: 03/14/21  3:31 PM   Specimen: Nasopharyngeal Swab; Nasopharyngeal(NP) swabs in vial transport medium  Result Value Ref Range Status   SARS Coronavirus 2 by RT PCR NEGATIVE NEGATIVE Final    Comment: (NOTE) SARS-CoV-2 target nucleic acids are NOT DETECTED.  The SARS-CoV-2 RNA is generally detectable in upper respiratory specimens during the acute phase of infection. The lowest concentration of SARS-CoV-2 viral copies this assay can detect is 138 copies/mL. A negative result does not preclude SARS-Cov-2 infection and should not be used as the sole basis for treatment or other patient management decisions. A negative result may occur with  improper specimen collection/handling, submission of specimen other than nasopharyngeal swab, presence of viral mutation(s) within the areas targeted by this assay, and inadequate number of viral copies(<138 copies/mL). A negative result must be combined with clinical observations, patient history, and epidemiological information. The expected result is Negative.  Fact Sheet for Patients:  03/16/21  Fact Sheet for Healthcare Providers:  BloggerCourse.com  This test is no t yet approved or cleared by the [employer].[place] FDA and  has been authorized for detection and/or diagnosis of SARS-CoV-2 by FDA under an Emergency Use Authorization (EUA). This EUA will remain  in effect (meaning this test can be used) for the duration of the COVID-19 declaration under Section 564(b)(1) of the Act, 21 U.S.C.section 360bbb-3(b)(1), unless the authorization is terminated  or revoked sooner.       Influenza A by PCR NEGATIVE NEGATIVE Final   Influenza B by PCR NEGATIVE NEGATIVE Final    Comment:  (NOTE) The Xpert Xpress SARS-CoV-2/FLU/RSV plus assay is intended as an aid in the diagnosis of influenza from Nasopharyngeal swab specimens and should not be used as a sole basis for treatment. Nasal washings and aspirates are unacceptable for Xpert Xpress SARS-CoV-2/FLU/RSV testing.  Fact Sheet for Patients: [place]  Fact Sheet for Healthcare Providers: BloggerCourse.com  This test is not yet approved or cleared by the [employer]ious[employer]ker.it FDA and has been authorized for detection and/or diagnosis of SARS-CoV-2 by FDA under an Emergency Use Authorization (EUA). This EUA will remain in effect (meaning this test can be used) for the duration of the COVID-19 declaration under Section 564(b)(1) of the Act, 21 U.S.C. section 360bbb-3(b)(1), unless the authorization is terminated or revoked.  Performed at [facility], [address] [place], [place]          Radiology Studies: MR BRAIN WO  CONTRAST  Result Date: 03/18/2021 CLINICAL DATA:  Neuro deficit, acute, stroke suspected. EXAM: MRI HEAD WITHOUT CONTRAST TECHNIQUE: Multiplanar, multiecho pulse sequences of the brain and surrounding structures were obtained without intravenous contrast. COMPARISON:  Noncontrast head CT 03/14/2021. FINDINGS: Brain: Mild intermittent motion degradation. Mild cerebral and cerebellar atrophy. Small patchy acute cortical infarcts within the left parietal lobe (with involvement of the left postcentral gyrus). 11 mm acute infarct within the medial right thalamus. 14 mm acute infarct within the medial aspect of the superior left cerebellar hemisphere. Mild multifocal T2/FLAIR hyperintensity within the cerebral white matter and basal ganglia bilaterally, nonspecific but compatible with chronic small vessel ischemic disease. No evidence of intracranial mass. No chronic intracranial blood products. No extra-axial fluid collection. No midline  shift. Vascular: Expected proximal arterial flow voids. Skull and upper cervical spine: No focal marrow lesion. Incompletely assessed upper cervical spondylosis. Sinuses/Orbits: Visualized orbits show no acute finding. Mild bilateral ethmoid, sphenoid and maxillary sinus mucosal thickening. IMPRESSION: Patchy small acute cortical infarcts within the left parietal lobe (with involvement of the postcentral gyrus). 11 mm acute infarct within the medial right thalamus. 14 mm acute infarct within the medial aspect of the superior left cerebellar hemisphere. Given the involvement of multiple vascular territories, consider an embolic process. Background mild parenchymal atrophy and chronic small vessel ischemic disease. Mild paranasal sinus mucosal thickening. Electronically Signed   By: Jackey LogeKyle  Golden DO   On: 03/18/2021 15:09   CARDIAC CATHETERIZATION  Result Date: 03/17/2021  Prox RCA lesion is 100% stenosed.  Mid LAD lesion is 50% stenosed.  Prox LAD to Mid LAD lesion is 60% stenosed.  1st Diag lesion is 50% stenosed.  Prox Cx lesion is 50% stenosed.  1st Mrg lesion is 60% stenosed.  1.  Non-ST elevation myocardial infarction 2.  One-vessel coronary artery disease with occluded proximal RCA with left-to-right collaterals, calcified 60% stenosis mid LAD, 60% stenosis proximal OM1 Recommendations 1.  Medical therapy, add Plavix 75 mg daily 2.  Aggressive risk factor modification 3.  Follow-up in 1 week        Scheduled Meds: . aspirin EC  81 mg Oral Daily  . atorvastatin  80 mg Oral Daily  . clopidogrel  75 mg Oral Q breakfast  . [START ON 03/19/2021] enoxaparin (LOVENOX) injection  40 mg Subcutaneous Q24H  . losartan  50 mg Oral Daily  . metoprolol tartrate  25 mg Oral BID  . sodium chloride flush  3 mL Intravenous Q12H  . sodium chloride flush  3 mL Intravenous Q12H   Continuous Infusions: . sodium chloride       LOS: 4 days    Time spent: 45 min    Silvano BilisNoah B Tashi Andujo, MD [facility]   If 7PM-7AM, please contact night-coverage [email or web address] Password [email or web address] 03/18/2021, 4:40 PM

## 2021-03-18 NOTE — TOC Transition Note (Signed)
Transition of Care [facility]) - CM/SW Discharge Note   Patient Details  Name: Victoria [name] MRN: [id number] Date of Birth: 13-Nov-1946  Transition of Care [facility]) CM/SW Contact:  Maree Krabbe, LCSW Phone Number: 03/18/2021, 2:08 PM   Clinical Narrative:   HH arranged through [facility]. Rolling walker delivered to bedside. NO additional needs.    Final next level of care: Home w Home Health Services Barriers to Discharge: No Barriers Identified   Patient Goals and CMS Choice Patient states their goals for this hospitalization and ongoing recovery are:: to get better   Choice offered to / list presented to : Patient  Discharge Placement                    Patient and family notified of of transfer: 03/18/21  Discharge Plan and Services     Post Acute Care Choice: Home Health          DME Arranged: [employer] DME Agency: [employer] Date DME Agency Contacted: 03/18/21 Time DME Agency Contacted: 1249 Representative spoke with at DME Agency: patricia HH Arranged: PT,OT,Nurse's Aide HH Agency: [facility] Date [facility] Agency Contacted: 03/18/21 Time HH Agency Contacted: 1250 Representative spoke with at [facility] Agency: brittany  Social Determinants of Health (SDOH) Interventions     Readmission Risk Interventions No flowsheet data found.

## 2021-03-18 NOTE — Discharge Instructions (Signed)
Heart Attack A heart attack occurs when blood and oxygen supply to the heart is cut off. A heart attack causes damage to the heart that cannot be fixed. A heart attack is also called a myocardial infarction, or MI. If you think you are having a heart attack, do not wait to see if the symptoms will go away. Get medical help right away. What are the causes? This condition may be caused by:  A fatty substance (plaque) in the blood vessels (arteries). This can block the flow of blood to the heart.  A blood clot in the blood vessels that go to the heart. The blood clot blocks blood flow.  Low blood pressure.  An abnormal heartbeat.  Some diseases, such as problems in red blood cells (anemia)orproblems in breathing (respiratory failure).  Tightening (spasm) of a blood vessel that cuts off blood to the heart.  A tear in a blood vessel of the heart.  High blood pressure.   What increases the risk? The following factors may make you more likely to develop this condition:  Aging. The older you are, the higher your risk.  Having a personal or family history of chest pain, heart attack, stroke, or narrowing of the arteries in the legs, arms, head, or stomach (peripheral artery disease).  Being female.  Smoking.  Not getting regular exercise.  Being overweight or obese.  Having high blood pressure.  Having high cholesterol.  Having diabetes.  Drinking too much alcohol.  Using illegal drugs, such as cocaine or methamphetamine. What are the signs or symptoms? Symptoms of this condition include:  Chest pain. It may feel like: ? Crushing or squeezing. ? Tightness, pressure, fullness, or heaviness.  Pain in the arm, neck, jaw, back, or upper body.  Shortness of breath.  Heartburn.  Upset stomach (indigestion).  Feeling like you may vomit (nauseous).  Cold sweats.  Feeling tired.  Sudden light-headedness. How is this treated? A heart attack must be treated as soon as  possible. Treatment may include:  Medicines to: ? Break up or dissolve blood clots. ? Thin blood and help prevent blood clots. ? Treat blood pressure. ? Improve blood flow to the heart. ? Reduce pain. ? Reduce cholesterol.  Procedures to widen a blocked artery and keep it open.  Open heart surgery.  Receiving oxygen.  Making your heart strong again (cardiac rehabilitation) through exercise, education, and counseling.   Follow these instructions at home: Medicines  Take over-the-counter and prescription medicines only as told by your doctor. You may need to take medicine: ? To keep your blood from clotting too easily. ? To control blood pressure. ? To lower cholesterol. ? To control heart rhythms.  Do not take these medicines unless your doctor says it is okay: ? NSAIDs, such as ibuprofen. ? Supplements that have vitamin A, vitamin E, or both. ? Hormone replacement therapy that has estrogen with or without progestin. Lifestyle  Do not use any products that have nicotine or tobacco, such as cigarettes, e-cigarettes, and chewing tobacco. If you need help quitting, ask your doctor.  Avoid secondhand smoke.  Exercise regularly. Ask your doctor about a cardiac rehab program.  Eat heart-healthy foods. Your doctor will tell you what foods to eat.  Stay at a healthy weight.  Lower your stress level.  Do not use illegal drugs.      Alcohol use  Do not drink alcohol if: ? Your doctor tells you not to drink. ? You are pregnant, may be pregnant, or   are planning to become pregnant.  If you drink alcohol: ? Limit how much you use to:  0-1 drink a day for women.  0-2 drinks a day for men. ? Know how much alcohol is in your drink. In the [place] one drink equals one 12 oz bottle of beer (355 mL), one 5 oz glass of wine (148 mL), or one 1 oz glass of hard liquor (44 mL). General instructions  Work with your doctor to treat other problems you may have, such as diabetes or  high blood pressure.  Get screened for depression. Get treatment if needed.  Keep your vaccines up to date. Get the flu shot (influenza vaccine) every year.  Keep all follow-up visits as told by your doctor. This is important. Contact a doctor if:  You feel very sad.  You have trouble doing your daily activities. Get help right away if:  You have sudden, unexplained discomfort in your chest, arms, back, neck, jaw, or upper body.  You have shortness of breath.  You have sudden sweating or clammy skin.  You feel like you may vomit.  You vomit.  You feel tired or weak.  You get light-headed or dizzy.  You feel your heart beating fast.  You feel your heart skipping beats.  You have blood pressure that is higher than 180/120. These symptoms may be an emergency. Do not wait to see if the symptoms will go away. Get medical help right away. Call your local emergency services (911 in the [place] Do not drive yourself to the hospital. Summary  A heart attack occurs when blood and oxygen supply to the heart is cut off.  Do not take NSAIDs unless your doctor says it is okay.  Do not smoke. Avoid secondhand smoke.  Exercise regularly. Ask your doctor about a cardiac rehab program. This information is not intended to replace advice given to you by your health care provider. Make sure you discuss any questions you have with your health care provider. Document Revised: 02/13/2019 Document Reviewed: 02/13/2019 Elsevier Patient Education  2021 [employer].  

## 2021-03-18 NOTE — Evaluation (Signed)
Physical Therapy Evaluation Patient Details Name: Victoria [name] MRN: [id number] DOB: May 04, 1946 Today's Date: 03/18/2021   History of Present Illness  Pt is a 75 y.o. female presenting to hospital 4/29 with loss of consciousness and CPR (pt bent over and had LOC falling forward hitting head on ground; family started CPR for about 2-3 minutes d/t pt did not appear to be breathing and unable palpate pulse.  Pt admitted with NSTEMI, syncope, paroxysmal a-fib, hypokalemia, essential htn, and HLD.  PMH includes arthritis, htn, HLD.  Clinical Impression  Prior to hospital admission, pt was independent with functional mobility and lives with her husband and son in 1 level home with 5 STE L railing.  Currently pt is independent with bed mobility and SBA with transfers.  During ambulation, pt was leaning a little to the R (varied to extent of lean though), veering to the R, and loosing balance to the R when walking no AD; trialed [facility] but pt still demonstrating balance impairments requiring assist for balance; trialed RW (pt resting R hand on walker for some stability with R wrist in neutral position in order to maintain R radial cardiac cath precautions) and pt with improved balance but still requiring CGA for safety d/t occasional balance concerns towards R side (pt was able to self correct).  Pt and pt's son educated on pt's fall risk concerns and recommendation for 24/7 assist (pt's son reports 24/7 assist available); also educated pt and pt's son on recommendations for HHPT, youth sized RW (and how to appropriately fit RW to pt), and BSC (pt's son reports they may have RW at home already and do have BSC at home already).  Pt and pt's son report no further questions for therapy at this time.  Pt would benefit from skilled PT to address noted impairments and functional limitations (see below for any additional details).  Pt's nurse notified of pt's R lean, veering R and loosing balance to R with ambulation during  session (attending MD notified via secure message end of session).    Follow Up Recommendations Home health PT;Supervision/Assistance - 24 hour    Equipment Recommendations  Rolling walker with 5" wheels (youth sized)    Recommendations for Other Services OT consult     Precautions / Restrictions Precautions Precautions: Fall Restrictions Weight Bearing Restrictions: Yes Other Position/Activity Restrictions: R radial cardiac cath precautions      Mobility  Bed Mobility Overal bed mobility: Independent             General bed mobility comments: Supine to/from sit without any noted difficulties    Transfers Overall transfer level: Needs assistance Equipment used: None Transfers: Sit to/from Stand Sit to Stand: Supervision         General transfer comment: SBA for safety but pt steady with transfers  Ambulation/Gait Ambulation/Gait assistance: Min assist;Min guard Gait Distance (Feet):  (160 feet x2) Assistive device: None;Rolling walker (2 wheeled) (youth sized)   Gait velocity: decreased   General Gait Details: pt was leaning a little to the R (varied to extent of lean though), veering to the R, and loosing balance to the R when walking no AD; trialed SPC in L UE (d/t R radial cardiac cath precautions) but pt still demonstrating balance impairments requiring assist for balance; trialed RW (pt resting R hand on walker for some stability with R wrist in neutral position in order to maintain R radial cardiac cath precautions) and pt with improved balance but still requiring CGA for safety d/t  occasional balance concerns towards R side (pt was able to self correct)  Stairs            Wheelchair Mobility    Modified Rankin (Stroke Patients Only)       Balance Overall balance assessment: Needs assistance Sitting-balance support: No upper extremity supported;Feet supported Sitting balance-Leahy Scale: Normal Sitting balance - Comments: steady sitting  reaching outside BOS   Standing balance support: No upper extremity supported;During functional activity Standing balance-Leahy Scale: Poor Standing balance comment: pt requiring assist for balance walking without UE support                             Pertinent Vitals/Pain Pain Assessment: No/denies pain  HR 65-69 bpm during sessions activities; O2 sats WFL on room air.    Home Living Family/patient expects to be discharged to:: Private residence Living Arrangements: Spouse/significant other;Children (pt's son) Available Help at Discharge: Family;Available 24 hours/day Type of Home: House Home Access: Stairs to enter Entrance Stairs-Rails: Left Entrance Stairs-Number of Steps: 5 Home Layout: One level Home Equipment: Walker - 2 wheels;Walker - 4 wheels;Cane - single point;Bedside commode      Prior Function Level of Independence: Independent         Comments: No h/o falls except for recent fall just prior to hospitalizaiton.     Hand Dominance        Extremity/Trunk Assessment   Upper Extremity Assessment Upper Extremity Assessment: Overall WFL for tasks assessed (at least 3/5 B UE elbow flexion/extension and shoulder flexion)    Lower Extremity Assessment Lower Extremity Assessment: RLE deficits/detail;LLE deficits/detail (intact B LE heel to shin coordination, light touch sensation, proprioception, and tone) RLE Deficits / Details: 4+/5 hip flexion, knee flexion/extension, and DF/PF LLE Deficits / Details: 4+/5 hip flexion, knee flexion/extension, and DF/PF    Cervical / Trunk Assessment Cervical / Trunk Assessment: Normal;Other exceptions Cervical / Trunk Exceptions: mild R lean with trunk and head during ambulation  Communication   Communication: No difficulties  Cognition Arousal/Alertness: Awake/alert Behavior During Therapy: WFL for tasks assessed/performed Overall Cognitive Status: Within Functional Limits for tasks assessed                                         General Comments General comments (skin integrity, edema, etc.): No bleeding noted R radial cath procedure site beginning/end of session.  Nursing cleared pt for participation in physical therapy.  Pt agreeable to PT session.  Pt's son present during session.    Exercises  Gait training   Assessment/Plan    PT Assessment Patient needs continued PT services  PT Problem List Decreased balance;Decreased mobility;Decreased knowledge of use of DME;Decreased knowledge of precautions       PT Treatment Interventions DME instruction;Gait training;Stair training;Functional mobility training;Therapeutic activities;Therapeutic exercise;Balance training;Neuromuscular re-education;Patient/family education    PT Goals (Current goals can be found in the Care Plan section)  Acute Rehab PT Goals Patient Stated Goal: to go home PT Goal Formulation: With patient Time For Goal Achievement: 04/01/21 Potential to Achieve Goals: Good    Frequency Min 2X/week   Barriers to discharge        Co-evaluation               AM-PAC PT "6 Clicks" Mobility  Outcome Measure Help needed turning from your back to your side while in a  flat bed without using bedrails?: None Help needed moving from lying on your back to sitting on the side of a flat bed without using bedrails?: None Help needed moving to and from a bed to a chair (including a wheelchair)?: A Little Help needed standing up from a chair using your arms (e.g., wheelchair or bedside chair)?: A Little Help needed to walk in hospital room?: A Little Help needed climbing 3-5 steps with a railing? : A Little 6 Click Score: 20    End of Session Equipment Utilized During Treatment: Gait belt Activity Tolerance: Patient tolerated treatment well Patient left: in bed;with call bell/phone within reach;with bed alarm set;with family/visitor present Nurse Communication: Mobility status;Precautions;Other (comment)  (pt's R lean, veering R, and loss of balance to R side with ambulation) PT Visit Diagnosis: Other abnormalities of gait and mobility (R26.89);Unsteadiness on feet (R26.81)    Time: 2542-7062 PT Time Calculation (min) (ACUTE ONLY): 43 min   Charges:   PT Evaluation $PT Eval Low Complexity: 1 Low PT Treatments $Gait Training: 23-37 mins       Selda Jalbert, PT 03/18/21, 11:52 AM

## 2021-03-18 NOTE — TOC Initial Note (Signed)
Transition of Care [facility]) - Initial/Assessment Note    Patient Details  Name: Victoria [name] MRN: [id number] Date of Birth: October 19, 1946  Transition of Care [facility]) CM/SW Contact:    Maree Krabbe, LCSW Phone Number: 03/18/2021, 12:50 PM  Clinical Narrative:     Pt is alert and oriented. Pt is agreeable to [facility] and does not have a agency preference. Pt gave CSW verbal permission to see which agencies could service. Pt also needs youth sized rolling walker--referral given to [employer] and will be delivered at bedside.              Expected Discharge Plan: Home w Home Health Services Barriers to Discharge: Continued Medical Work up   Patient Goals and CMS Choice Patient states their goals for this hospitalization and ongoing recovery are:: to get better   Choice offered to / list presented to : Patient  Expected Discharge Plan and Services Expected Discharge Plan: Home w Home Health Services     Post Acute Care Choice: Home Health Living arrangements for the past 2 months: Single Family Home Expected Discharge Date: 03/18/21               DME Arranged: Dan Humphreys youth DME Agency: [employer] Date DME Agency Contacted: 03/18/21 Time DME Agency Contacted: 1249 Representative spoke with at DME Agency: patricia HH Arranged: PT,OT,Nurse's Aide HH Agency: [facility] Date HH Agency Contacted: 03/18/21 Time HH Agency Contacted: 1250 Representative spoke with at [facility] Agency: brittany  Prior Living Arrangements/Services Living arrangements for the past 2 months: Single Family Home Lives with:: Spouse,Adult Children Patient language and need for interpreter reviewed:: Yes Do you feel safe going back to the place where you live?: Yes      Need for Family Participation in Patient Care: Yes (Comment) Care giver support system in place?: Yes (comment)   Criminal Activity/Legal Involvement Pertinent to Current Situation/Hospitalization: No - Comment as needed  Activities of Daily Living Home  Assistive Devices/Equipment: None ADL Screening (condition at time of admission) Patient's cognitive ability adequate to safely complete daily activities?: No Is the patient deaf or have difficulty hearing?: No Does the patient have difficulty seeing, even when wearing glasses/contacts?: Yes Does the patient have difficulty concentrating, remembering, or making decisions?: No Patient able to express need for assistance with ADLs?: No Does the patient have difficulty dressing or bathing?: No Independently performs ADLs?: Yes (appropriate for developmental age) Does the patient have difficulty walking or climbing stairs?: No Weakness of Legs: None Weakness of Arms/Hands: None  Permission Sought/Granted Permission sought to share information with : Family Supports    Share Information with NAME: Molly Maduro     Permission granted to share info w Relationship: spouse     Emotional Assessment Appearance:: Appears stated age Attitude/Demeanor/Rapport: Engaged Affect (typically observed): Accepting Orientation: : Oriented to Self,Oriented to Place,Oriented to  Time,Oriented to Situation Alcohol / Substance Use: Not Applicable    Admission diagnosis:  NSTEMI (non-ST elevated myocardial infarction) [facility]) [I21.4] Patient Active Problem List   Diagnosis Date Noted  . Syncope 03/15/2021  . Hypokalemia 03/15/2021  . Hypertension   . Hyperlipidemia   . NSTEMI (non-ST elevated myocardial infarction) (HCC) 03/14/2021   PCP:  Dorothey Baseman, MD Pharmacy:   [facility] - [place], [place] - [address] [place] [address] Phone: [phone] Fax: [phone]     Social Determinants of Health (SDOH) Interventions    Readmission Risk Interventions No flowsheet data found.

## 2021-03-18 NOTE — Plan of Care (Signed)

## 2021-03-18 NOTE — Progress Notes (Addendum)
OT Cancellation Note  Patient Details Name: ANNACLAIRE WALSWORTH MRN: [id number] DOB: [birth year]   Cancelled Treatment:    Reason Eval/Treat Not Completed: Other (comment). Consult received, chart reviewed. Spoke with PT. Pt pending MRI. Will attempt OT evaluation later this afternoon pending imaging and updated plan of care.   Addendum, 3:35pm: MRI results back. Still pending updated plan of care based on results. Will hold OT evaluation this date and re-attempt next date as appropriate.   Wynona Canes, MPH, MS, OTR/L ascom [phone] 03/18/21, 12:33 PM

## 2021-03-18 NOTE — Consult Note (Addendum)
Neurology Consultation Reason for Consult: Strokes on MRI brain Requesting Physician: Shonna Chock  CC: Syncope  History is obtained from: Patient and chart review   HPI: Victoria [name] is a 75 y.o. female with a past medical history significant for hypertension, hyperlipidemia, admitted to [facility] on 03/14/2021 with NSTEMI.  Per chart review, patient had been in her usual state of health on the day of the event, was leaning over to pick up something when she experienced loss of consciousness without prodrome and fell straight forward hitting her head.  To me, the patient confirms that she felt a bit lightheaded and off just before she lost consciousness but does not remember actually falling.  She does remember that she was planting in her garden and was just standing up from moving something on the ground.  She remembers coming to at some point in the ambulance that she recalls that the right was very bumpy.  She was reportedly unconscious and unresponsive for 1 to 2 minutes while family initiated CPR while contacting EMS.  Per EMS she was in atrial fibrillation in route with rates in the 110s to 130s and had some emesis in route.  However on arrival to the ED she was noted to be in sinus rhythm.  Troponin was found to be elevated to 996, with some initial ST elevation in a single lead as well as ST depressions in leads II, III, aVF, V5 and V6 which resolved on repeat EKG.  She was treated as an NSTEMI with heparin bolus and drip, started on beta-blocker (metoprolol 25 mg twice daily), high intensity statin (atorvastatin 80 mg daily) and admitted for further work-up and monitoring including cardiac catheterization.  Labs on arrival were additionally notable for hypokalemia to 2.8, mild leukocytosis at 10.8 and mild normocytic anemia with a hemoglobin of 11.3.  Troponin plateaued at 1288 and she remained chest pain-free, telemetry unremarkable.  Echocardiogram was completed revealing low normal left ventricular  ejection fraction (50 to 55%, LV apical hypokinesis, normal left atrial size, normal right atrial size).  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  She was started on dual antiplatelet therapy.  She was noted to be delirious after the procedure per primary team although she does not recall being off herself, and was therefore kept overnight for observation. She notes that her son felt that the quality of her voice was different than normal (louder and raspier, not as soft as usual), though she denies noticing any change herself.  The following day she was noted to be veering while walking with physical therapy for which MRI was obtained revealing multifocal strokes.  Neurology was consulted for further recommendations.  LKW: 5/2 9 AM tPA given?: No, due to out of the window  Premorbid modified rankin scale:      0 - No symptoms.  ROS: All other review of systems was negative except as noted in the HPI.   Past Medical History:  Diagnosis Date  . Arthritis   . Headache   . Hyperlipidemia   . Hypertension    Past Surgical History:  Procedure Laterality Date  . COLONOSCOPY WITH PROPOFOL N/A 09/26/2015   Procedure: COLONOSCOPY WITH PROPOFOL;  Surgeon: Wallace Cullens, MD;  Location: [facility] ENDOSCOPY;  Service: Gastroenterology;  Laterality: N/A;  . LEFT HEART CATH AND CORONARY ANGIOGRAPHY N/A 03/17/2021   Procedure: LEFT HEART CATH AND CORONARY  ANGIOGRAPHY;  Surgeon: Marcina Millard, MD;  Location: [facility] INVASIVE CV LAB;  Service: Cardiovascular;  Laterality: N/A;  . NO PAST SURGERIES    . TUBAL LIGATION     Current Outpatient Medications  Medication Instructions  . aspirin EC 81 mg, Oral, Daily, Swallow whole.  Marland Kitchen aspirin 325 mg, Daily  . [START ON 03/19/2021] atorvastatin (LIPITOR) 80 mg, Oral, Daily  . b complex vitamins capsule 1 capsule, Daily  .  Cholecalciferol 25 MCG (1000 UT) tablet 1 tablet, Oral, Daily  . [START ON 03/19/2021] clopidogrel (PLAVIX) 75 mg, Oral, Daily with breakfast  . [START ON 03/19/2021] losartan (COZAAR) 50 mg, Oral, Daily  . losartan-hydrochlorothiazide (HYZAAR) 100-12.5 MG tablet 1 tablet, Oral, Daily  . metoprolol tartrate (LOPRESSOR) 25 mg, Oral, 2 times daily  . Multiple Vitamin (MULTI-VITAMIN) tablet 1 tablet, Oral, Daily  . valsartan-hydrochlorothiazide (DIOVAN-HCT) 320-25 MG tablet 1 tablet, Daily    Family History  Problem Relation Age of Onset  . Breast cancer Neg Hx    Social History:  reports that she has never smoked. She has never used smokeless tobacco. She reports that she does not drink alcohol and does not use drugs.   Exam: Current vital signs: BP 139/75 (BP Location: Left Arm)   Pulse 65   Temp 97.8 F (36.6 C) (Oral)   Resp 19   Ht 4\' 10"  (1.473 m)   Wt 48.3 kg   SpO2 99%   BMI 22.24 kg/m  Vital signs in last 24 hours: Temp:  [97.8 F (36.6 C)-98.6 F (37 C)] 97.8 F (36.6 C) (05/03 1931) Pulse Rate:  [64-78] 65 (05/03 1931) Resp:  [17-19] 19 (05/03 1931) BP: (135-149)/(69-76) 139/75 (05/03 1931) SpO2:  [97 %-100 %] 99 % (05/03 1931) Weight:  [48.3 kg-48.5 kg] 48.3 kg (05/03 0938)   Physical Exam  Constitutional: Appears well-developed and well-nourished.  Psych: Affect appropriate to situation, calm, cooperative and pleasant Eyes: No scleral injection HENT: No oropharyngeal obstruction.  MSK: no joint deformities.  Cardiovascular: Normal rate and regular rhythm.  Respiratory: Effort normal, non-labored breathing GI: Soft.  No distension. There is no tenderness.  Skin: Warm dry and intact visible skin  Neuro: Mental Status: Patient is awake, alert, oriented to person, place, month, year, and situation. Patient is able to give a clear and coherent history. No signs of aphasia or neglect Cranial Nerves: II: Visual Fields are full. Pupils are equal, round, and  reactive to light.   III,IV, VI: EOMI without ptosis or diploplia.  She does have a subtle left beating nystagmus on left gaze V: Facial sensation is symmetric to light touch VII: Facial movement is notable for mild right facial droop at rest with slightly delayed activation which she reports is her baseline VIII: hearing is intact to voice X: Uvula elevates symmetrically XI: Shoulder shrug is symmetric. XII: tongue is midline without atrophy or fasciculations.  Motor: Tone is normal. Bulk is normal. 5/5 strength was present in all four extremities, other than mild right upper extremity flexion 4+/5 which she attributes to being anxious about her radial artery access in that wrist Sensory: Sensation is symmetric to light touch in the arms and legs. Deep Tendon Reflexes: 2+ and symmetric in the biceps and patellae.  Plantars: Toes are downgoing bilaterally.  Cerebellar: FNF and HKS are intact bilaterally  NIHSS total 0  I have reviewed labs in epic and the results pertinent to this consultation are: Creatinine stable this morning at 0.78  Lab Results  Component Value  Date   CHOL 145 03/19/2021   HDL 44 03/19/2021   LDLCALC 82 03/19/2021   TRIG 96 03/19/2021   CHOLHDL 3.3 03/19/2021   Lab Results  Component Value Date   HGBA1C 5.7 (H) 03/19/2021     No results found for: VITAMINB12    I have reviewed the images obtained:  MRI brain personally reviewed, infarcts in multiple vascular distributions including medial aspect of the superior left cerebellum, medial right thalamus, and left parietal lobe  CTA head and neck personally reviewed, 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  Differential includes initial presenting arrhythmia such as atrial fibrillation, versus periprocedural complication from aortic arch atherosclerosis.  Recommendations:  # Multifocal strokes -CTA completed on my preliminary recommendations yesterday as above -Hold anticoagulation for now outside of an emergent indication.  Low goal PTT, no bolus protocol if anticoagulation is required more urgently to reduce risk of hemorrhagic conversion of acute strokes.  Given lack of clarity whether these occurred on her initial admission versus periprocedurally, will err on the conservative side that the event occurred on 5/2, with plans to start anticoagulation 5/6 if indicated for nonemergent indication such as atrial fibrillation -Lipid panel and A1c reveal A1c within goal (5.7%), and LDL slightly above goal at 80 (goal less than 70) -Agree with high intensity statin and dual antiplatelet therapy already started by cardiology, now with dual neurological indication as well  -From a neurological perspective, if indication for anticoagulation is found would not need to continue antiplatelet therapy but defer to cardiology if it is required for her cardiac indication.  If anticoagulation is later stopped, would resume at least aspirin 81 mg daily -Consider loop recorder versus 30-day event monitor on discharge to attempt to capture occult atrial fibrillation or other arrhythmias contributing to her presentation, will discuss with cardiology whether they were able to confirm atrial fibrillation reported by EMS -I will additionally discuss potential TEE with cardiology to rule out intracardiac thrombus given her LV apical hypokinesis and low normal EF -Risk factor modification counseling, diet, exercise, medication adherence  # Post op delirium -B12 level to rule out reversible causes of delirium/dementia  Brooke Dare MD-PhD [facility] [phone] [facility] coverage for [facility] is from 8 AM to 4 AM in-house and 4 PM to 8 PM by telephone/video. 8 PM to 8 AM emergent questions or overnight urgent questions should be addressed to Teleneurology On-call or [facility] neurohospitalist; contact information can be found on AMION  Addendum: In further discussion with cardiology, will hold on TEE at this time given that they do feel the initial report of atrial fibrillation by EMS is accurate and thus presents adequate indication for anticoagulation.  We will plan to start anticoagulation on Friday, at which time aspirin should be stopped and Plavix should be continued per cardiology.  Therefore patient will need one more day of dual antiplatelet therapy tomorrow, followed by initiation of Eliquis on Friday.  Patient will need outpatient follow-up with neurology, can be referred to call [facility] clinic for appointment [phone]  Thank you for involving [place] in care of this patient.

## 2021-03-18 NOTE — Discharge Summary (Deleted)
Victoria [name] YQM:[id number] DOB: 27-Jun-1946 DOA: 03/14/2021  PCP: Dorothey Baseman, MD  Admit date: 03/14/2021 Discharge date: 03/18/2021  Time spent: 35 minutes  Recommendations for Outpatient Follow-up:  1. pcp and cardiology fu, also referred to cardiac rehab  2. Needs bmp @ f/u    Discharge Diagnoses:  Principal Problem:   NSTEMI (non-ST elevated myocardial infarction) [facility]) Active Problems:   Syncope   Hypokalemia   Hypertension   Hyperlipidemia   Discharge Condition: stable  Diet recommendation: heart healthy  Filed Weights   03/17/21 0817 03/18/21 0500 03/18/21 0938  Weight: 48.9 kg 48.5 kg 48.3 kg    History of present illness:  Victoria Heydt Moricleis a 75 y.o.femalewith medical history significant forhypertension, hyperlipidemiawho is admitted to [facility] on 4/29/2022with NSTEMIafter presenting from home to [facility] ED for evaluation of syncope.  The patient reportedly was at home earlier this afternoon when she leaned forward to pick up something located on her near the floor at which time she experienced consciousness that was witnessed by her family,who reported that the patient fell straight forward, hitting her head on the floor.At that time, she was completely unconscious and nonresponsive per her family, who did not reportedly initiated 1 to 2 minutes of CPR while contacting EMS. Socially, patient brought to [facility] ED via EMS, and received a full dose aspirin in route.  Provide discussions with the patient, she denies any prior syncopal event similar to the above. She denies any prodrome leading up to her loss of consciousness. She reports that she was in her baseline state of health today leading up to the syncopal event. She denies any recent or subsequent chest pain. Additionally she denies any recent shortness of breath, palpitations, diaphoresis, nausea, vomiting. No preceding trauma. Denies any recent wheezing, cough,  hemoptysis, lower extremity erythema, [place] new onset calf tenderness. She also denies any recent orthopnea, PND, or peripheral edema. No subjective fever, chills, rigors, or generalized myalgias. No recent travel.  She denies any known history of underlying coronary artery disease. Acknowledges a history of hypertension for which she reports good compliance with her outpatient losartan and HCTZ. Also acknowledges a history of hyperlipidemia that she reports is managed via lifestyle modifications, and is not currently on a statin medication at home. Denies any known history of underlying diabetes, and reports that she is a lifelong non-smoker. No known family history of premature coronary artery disease.  Hospital Course:  #NSTEMI # CAD # Delirium No hx cad. Trop plateau 1288. Chest pain free. Cardiology following. LHC showing one-vessel cad w/ occluded proximal rca as well as calcified stenosis in the mid-lad and proximal om1. TTE unremarkable. Mild delirium post-cath so kept one additional night, resolved by AM - cardiology advising medical mgmt w/ asa, plavix, metop, statin; cont losartan  # Syncope Likely 2/2 nstemi. Fell backwards and hit head. No bruise or laceration, ct neg. Mild ha currently, no focal neurologic deficits. No recurrence. No acute events on tele  # Hypokalemia Likely 2/2 hctz which has been held. Repleted, k wnl, mg wnl. - hold hctz @ discharge  # Essential hypertension: bp wnl now - on losart/hctz at home. Have added back losartan, hctz held. - metoprolol started   Procedures:  Left heart cath   Consultations:  cardiology  Discharge Exam: Vitals:   03/17/21 2034 03/18/21 0534  BP: 135/75 (!) 145/69  Pulse: 78 73  Resp: 17 17  Temp: 98.6 F (37 C) 98 F (36.7 C)  SpO2: 97% 100%  General exam: mild distress  Respiratory system: Clear to auscultation. Respiratory effort normal. Cardiovascular system: S1 & S2 heard, RRR. No JVD,  murmurs, rubs, gallops or clicks. No pedal edema. Gastrointestinal system: Abdomen is nondistended, soft and nontender. No organomegaly or masses felt. Normal bowel sounds heard. Central nervous system: Alert and oriented. No focal neurological deficits. Extremities: Symmetric 5 x 5 power. Skin: No rashes, lesions or ulcers Psychiatry: appears confused   Discharge Instructions   Discharge Instructions    Amb Referral to Cardiac Rehabilitation   Complete by: As directed    Diagnosis: NSTEMI   After initial evaluation and assessments completed: Virtual Based Care may be provided alone or in conjunction with Phase 2 Cardiac Rehab based on patient barriers.: Yes   Diet - low sodium heart healthy   Complete by: As directed    Increase activity slowly   Complete by: As directed      Allergies as of 03/18/2021   No Known Allergies     Medication List    STOP taking these medications   losartan-hydrochlorothiazide 100-12.5 MG tablet Commonly known as: HYZAAR   valsartan-hydrochlorothiazide 320-25 MG tablet Commonly known as: DIOVAN-HCT     TAKE these medications   aspirin EC 81 MG tablet Take 81 mg by mouth daily. Swallow whole. What changed: Another medication with the same name was removed. Continue taking this medication, and follow the directions you see here.   atorvastatin 80 MG tablet Commonly known as: LIPITOR Take 1 tablet (80 mg total) by mouth daily. Start taking on: Mar 19, 2021   b complex vitamins capsule Take 1 capsule by mouth daily.   Cholecalciferol 25 MCG (1000 UT) tablet Take 1 tablet by mouth daily.   clopidogrel 75 MG tablet Commonly known as: PLAVIX Take 1 tablet (75 mg total) by mouth daily with breakfast. Start taking on: Mar 19, 2021   losartan 50 MG tablet Commonly known as: COZAAR Take 1 tablet (50 mg total) by mouth daily. Start taking on: Mar 19, 2021   metoprolol tartrate 25 MG tablet Commonly known as: LOPRESSOR Take 1 tablet (25 mg  total) by mouth 2 (two) times daily.   Multi-Vitamin tablet Take 1 tablet by mouth daily.      No Known Allergies  Follow-up Information    Dorothey BasemanBronstein, David, MD Follow up.   Specialty: Family Medicine Contact information: [address] [place] [address] [phone]        Marcina MillardParaschos, Alexander, MD. Call.   Specialty: Cardiology Contact information: [address] [facility] [place] [address] [phone]                The results of significant diagnostics from this hospitalization (including imaging, microbiology, ancillary and laboratory) are listed below for reference.    Significant Diagnostic Studies: CT Head Wo Contrast  Result Date: 03/14/2021 CLINICAL DATA:  Syncopal episode EXAM: CT HEAD WITHOUT CONTRAST TECHNIQUE: Contiguous axial images were obtained from the base of the skull through the vertex without intravenous contrast. COMPARISON:  None. FINDINGS: Brain: Ventricles are normal in size. No mass, hemorrhage, edema or other evidence of acute parenchymal abnormality. No extra-axial hemorrhage. Vascular: Chronic calcified atherosclerotic changes of the large vessels at the skull base. No unexpected hyperdense vessel. Skull: Normal. Negative for fracture or focal lesion. Sinuses/Orbits: No acute finding. Other: None. IMPRESSION: Negative head CT. No intracranial mass, hemorrhage or edema. Electronically Signed   By: Bary RichardStan  Maynard M.D.   On: 03/14/2021 16:19  CARDIAC CATHETERIZATION  Result Date: 03/17/2021  Prox RCA lesion is 100% stenosed.  Mid LAD lesion is 50% stenosed.  Prox LAD to Mid LAD lesion is 60% stenosed.  1st Diag lesion is 50% stenosed.  Prox Cx lesion is 50% stenosed.  1st Mrg lesion is 60% stenosed.  1.  Non-ST elevation myocardial infarction 2.  One-vessel coronary artery disease with occluded proximal RCA with left-to-right collaterals, calcified 60% stenosis mid LAD, 60% stenosis proximal OM1  Recommendations 1.  Medical therapy, add Plavix 75 mg daily 2.  Aggressive risk factor modification 3.  Follow-up in 1 week   DG Chest Portable 1 View  Result Date: 03/14/2021 CLINICAL DATA:  Syncopal episode EXAM: PORTABLE CHEST 1 VIEW COMPARISON:  None. FINDINGS: Heart size is normal. There is atherosclerotic calcification of the aorta. Pulmonary vascularity is normal. The lungs are clear. No acute regional bone finding. IMPRESSION: No active disease. Aortic atherosclerosis. Electronically Signed   By: Paulina Fusi M.D.   On: 03/14/2021 17:34   ECHOCARDIOGRAM COMPLETE  Result Date: 03/16/2021    ECHOCARDIOGRAM REPORT   Patient Name:   DONELLA PASCARELLA Date of Exam: 03/15/2021 Medical Rec #:  [id number]      Height:       58.0 in Accession #:    [id number]     Weight:       110.2 lb Date of Birth:  [birth year]      BSA:          1.414 m Patient Age:    74 years       BP:           148/75 mmHg Patient Gender: F              HR:           65 bpm. Exam Location:  [facility] Procedure: 2D Echo, Cardiac Doppler and Color Doppler Indications:     Acute myocardial infarction, unspecified I21.9  History:         Patient has no prior history of Echocardiogram examinations.                  Arrythmias:Atrial Fibrillation; Risk Factors:Hypertension.  Sonographer:     Neysa Bonito Roar Referring Phys:  [id number] Angie Fava Diagnosing Phys: Marcina Millard MD IMPRESSIONS  1. Left ventricular ejection fraction, by estimation, is 50 to 55%. The left ventricle has low normal function. The left ventricle demonstrates regional wall motion abnormalities (see scoring diagram/findings for description). Left ventricular diastolic  parameters were normal.  2. Right ventricular systolic function is normal. The right ventricular size is normal.  3. The mitral valve is normal in structure. Mild mitral valve regurgitation. No evidence of mitral stenosis.  4. Tricuspid valve regurgitation is mild to moderate.  5. The aortic valve is normal in  structure. Aortic valve regurgitation is mild to moderate. No aortic stenosis is present.  6. The inferior vena cava is normal in size with greater than 50% respiratory variability, suggesting right atrial pressure of 3 mmHg. FINDINGS  Left Ventricle: Left ventricular ejection fraction, by estimation, is 50 to 55%. The left ventricle has low normal function. The left ventricle demonstrates regional wall motion abnormalities. The left ventricular internal cavity size was normal in size. There is no left ventricular hypertrophy. Left ventricular diastolic parameters were normal.  LV Wall Scoring: The apex is hypokinetic. Right Ventricle: The right ventricular size is normal. No increase in right ventricular wall thickness. Right ventricular systolic function is normal.  Left Atrium: Left atrial size was normal in size. Right Atrium: Right atrial size was normal in size. Pericardium: There is no evidence of pericardial effusion. Mitral Valve: The mitral valve is normal in structure. Mild mitral valve regurgitation. No evidence of mitral valve stenosis. Tricuspid Valve: The tricuspid valve is normal in structure. Tricuspid valve regurgitation is mild to moderate. No evidence of tricuspid stenosis. Aortic Valve: The aortic valve is normal in structure. Aortic valve regurgitation is mild to moderate. No aortic stenosis is present. Aortic valve peak gradient measures 5.2 mmHg. Pulmonic Valve: The pulmonic valve was normal in structure. Pulmonic valve regurgitation is not visualized. No evidence of pulmonic stenosis. Aorta: The aortic root is normal in size and structure. Venous: The inferior vena cava is normal in size with greater than 50% respiratory variability, suggesting right atrial pressure of 3 mmHg. IAS/Shunts: No atrial level shunt detected by color flow Doppler.  LEFT VENTRICLE PLAX 2D LVIDd:         3.61 cm  Diastology LVIDs:         2.73 cm  LV e' medial:    7.94 cm/s LV PW:         0.86 cm  LV E/e' medial:   12.6 LV IVS:        1.04 cm  LV e' lateral:   11.50 cm/s LVOT diam:     1.60 cm  LV E/e' lateral: 8.7 LVOT Area:     2.01 cm  RIGHT VENTRICLE RV Mid diam:    2.91 cm RV S prime:     10.70 cm/s TAPSE (M-mode): 1.2 cm LEFT ATRIUM             Index       RIGHT ATRIUM           Index LA diam:        3.70 cm 2.62 cm/m  RA Area:     10.40 cm LA Vol (A2C):   57.0 ml 40.32 ml/m RA Volume:   20.80 ml  14.71 ml/m LA Vol (A4C):   47.7 ml 33.74 ml/m LA Biplane Vol: 54.3 ml 38.41 ml/m  AORTIC VALVE                PULMONIC VALVE AV Area (Vmax): 1.98 cm    PV Vmax:        0.66 m/s AV Vmax:        114.00 cm/s PV Peak grad:   1.8 mmHg AV Peak Grad:   5.2 mmHg    RVOT Peak grad: 1 mmHg LVOT Vmax:      112.00 cm/s  AORTA Ao Root diam: 2.90 cm MITRAL VALVE                TRICUSPID VALVE MV Area (PHT): 4.36 cm     TR Peak grad:   21.0 mmHg MV Decel Time: 174 msec     TR Vmax:        229.00 cm/s MV E velocity: 100.00 cm/s MV A velocity: 104.00 cm/s  SHUNTS MV E/A ratio:  0.96         Systemic Diam: 1.60 cm MV A Prime:    10.4 cm/s Marcina Millard MD Electronically signed by Marcina Millard MD Signature Date/Time: 03/16/2021/10:06:17 AM    Final     Microbiology: Recent Results (from the past 240 hour(s))  Resp Panel by RT-PCR (Flu A&B, Covid) Nasopharyngeal Swab     Status: None   Collection Time: 03/14/21  3:31 PM  Specimen: Nasopharyngeal Swab; Nasopharyngeal(NP) swabs in vial transport medium  Result Value Ref Range Status   SARS Coronavirus 2 by RT PCR NEGATIVE NEGATIVE Final    Comment: (NOTE) SARS-CoV-2 target nucleic acids are NOT DETECTED.  The SARS-CoV-2 RNA is generally detectable in upper respiratory specimens during the acute phase of infection. The lowest concentration of SARS-CoV-2 viral copies this assay can detect is 138 copies/mL. A negative result does not preclude SARS-Cov-2 infection and should not be used as the sole basis for treatment or other patient management decisions. A  negative result may occur with  improper specimen collection/handling, submission of specimen other than nasopharyngeal swab, presence of viral mutation(s) within the areas targeted by this assay, and inadequate number of viral copies(<138 copies/mL). A negative result must be combined with clinical observations, patient history, and epidemiological information. The expected result is Negative.  Fact Sheet for Patients:  BloggerCourse.com  Fact Sheet for Healthcare Providers:  SeriousBroker.it  This test is no t yet approved or cleared by the [place] FDA and  has been authorized for detection and/or diagnosis of SARS-CoV-2 by FDA under an Emergency Use Authorization (EUA). This EUA will remain  in effect (meaning this test can be used) for the duration of the COVID-19 declaration under Section 564(b)(1) of the Act, 21 U.S.C.section 360bbb-3(b)(1), unless the authorization is terminated  or revoked sooner.       Influenza A by PCR NEGATIVE NEGATIVE Final   Influenza B by PCR NEGATIVE NEGATIVE Final    Comment: (NOTE) The Xpert Xpress SARS-CoV-2/FLU/RSV plus assay is intended as an aid in the diagnosis of influenza from Nasopharyngeal swab specimens and should not be used as a sole basis for treatment. Nasal washings and aspirates are unacceptable for Xpert Xpress SARS-CoV-2/FLU/RSV testing.  Fact Sheet for Patients: BloggerCourse.com  Fact Sheet for Healthcare Providers: SeriousBroker.it  This test is not yet approved or cleared by the [place] FDA and has been authorized for detection and/or diagnosis of SARS-CoV-2 by FDA under an Emergency Use Authorization (EUA). This EUA will remain in effect (meaning this test can be used) for the duration of the COVID-19 declaration under Section 564(b)(1) of the Act, 21 U.S.C. section 360bbb-3(b)(1), unless the authorization  is terminated or revoked.  Performed at [facility], [address] [place], [place] [address]      Labs: Basic Metabolic Panel: Recent Labs  Lab 03/14/21 1531 03/15/21 0454 03/16/21 0352 03/17/21 0537 03/18/21 0548  NA 137 135 140 139 138  K 2.8* 3.9 4.0 3.9 3.6  CL 103 105 113* 108 105  CO2 22 23 23 25  21*  GLUCOSE 182* 110* 93 94 109*  BUN 21 17 19  24* 22  CREATININE 1.02* 0.90 0.86 0.87 0.82  CALCIUM 9.3 8.8* 8.6* 8.8* 8.9  MG 2.1 2.1 2.1 1.9 2.2   Liver Function Tests: Recent Labs  Lab 03/14/21 1531  AST 62*  ALT 44  ALKPHOS 80  BILITOT 0.9  PROT 6.9  ALBUMIN 4.1   No results for input(s): LIPASE, AMYLASE in the last 168 hours. No results for input(s): AMMONIA in the last 168 hours. CBC: Recent Labs  Lab 03/14/21 1531 03/15/21 0454 03/16/21 0352  WBC 10.8* 7.0 8.2  HGB 11.3* 10.4* 10.8*  HCT 34.1* 31.0* 32.5*  MCV 92.2 92.5 93.7  PLT 272 204 206   Cardiac Enzymes: No results for input(s): CKTOTAL, CKMB, CKMBINDEX, TROPONINI in the last 168 hours. BNP: BNP (last 3 results) No results for input(s): BNP  in the last 8760 hours.  ProBNP (last 3 results) No results for input(s): PROBNP in the last 8760 hours.  CBG: Recent Labs  Lab 03/17/21 1132  GLUCAP 102*       Signed:  Silvano Bilis MD.  [facility] 03/18/2021, 10:03 AM

## 2021-03-19 ENCOUNTER — Inpatient Hospital Stay: Payer: Medicare PPO

## 2021-03-19 ENCOUNTER — Encounter: Payer: Self-pay | Admitting: Internal Medicine

## 2021-03-19 DIAGNOSIS — I214 Non-ST elevation (NSTEMI) myocardial infarction: Secondary | ICD-10-CM | POA: Diagnosis not present

## 2021-03-19 DIAGNOSIS — I639 Cerebral infarction, unspecified: Secondary | ICD-10-CM

## 2021-03-19 DIAGNOSIS — R918 Other nonspecific abnormal finding of lung field: Secondary | ICD-10-CM | POA: Diagnosis not present

## 2021-03-19 DIAGNOSIS — E7800 Pure hypercholesterolemia, unspecified: Secondary | ICD-10-CM

## 2021-03-19 DIAGNOSIS — E876 Hypokalemia: Secondary | ICD-10-CM | POA: Diagnosis not present

## 2021-03-19 LAB — LIPID PANEL
Cholesterol: 145 mg/dL (ref 0–200)
HDL: 44 mg/dL (ref >40)
LDL Cholesterol: 82 mg/dL (ref 0–99)
Total CHOL/HDL Ratio: 3.3 RATIO
Triglycerides: 96 mg/dL (ref <150)
VLDL: 19 mg/dL (ref 0–40)

## 2021-03-19 LAB — MAGNESIUM: Magnesium: 2.1 mg/dL (ref 1.7–2.4)

## 2021-03-19 LAB — BASIC METABOLIC PANEL
Anion gap: 8 (ref 5–15)
BUN: 17 mg/dL (ref 8–23)
CO2: 24 mmol/L (ref 22–32)
Calcium: 9 mg/dL (ref 8.9–10.3)
Chloride: 103 mmol/L (ref 98–111)
Creatinine, Ser: 0.78 mg/dL (ref 0.44–1.00)
GFR, Estimated: >60 mL/min (ref >60)
Glucose, Bld: 100 mg/dL — ABNORMAL HIGH (ref 70–99)
Potassium: 4 mmol/L (ref 3.5–5.1)
Sodium: 135 mmol/L (ref 135–145)

## 2021-03-19 LAB — VITAMIN B12: Vitamin B-12: 1182 pg/mL — ABNORMAL HIGH (ref 180–914)

## 2021-03-19 LAB — HEMOGLOBIN A1C
Hgb A1c MFr Bld: 5.7 % — ABNORMAL HIGH (ref 4.8–5.6)
Mean Plasma Glucose: 116.89 mg/dL

## 2021-03-19 MED ORDER — SODIUM CHLORIDE 0.9 % IV SOLN: INTRAVENOUS | Status: DC

## 2021-03-19 MED ORDER — ASPIRIN EC 81 MG PO TBEC: 81.0000 mg | DELAYED_RELEASE_TABLET | Freq: Every day | ORAL | 0 refills | Status: AC

## 2021-03-19 MED ORDER — IOHEXOL 350 MG/ML SOLN
75.0000 mL | Freq: Once | INTRAVENOUS | Status: AC | PRN
  Administered 2021-03-19: 75 mL via INTRAVENOUS

## 2021-03-19 MED ORDER — AMOXICILLIN-POT CLAVULANATE 875-125 MG PO TABS: 1.0000 | ORAL_TABLET | Freq: Two times a day (BID) | ORAL | 0 refills | Status: AC

## 2021-03-19 MED ORDER — APIXABAN 5 MG PO TABS: 5.0000 mg | ORAL_TABLET | Freq: Two times a day (BID) | ORAL | 2 refills | Status: AC

## 2021-03-19 MED ORDER — AMOXICILLIN-POT CLAVULANATE 875-125 MG PO TABS
1.0000 | ORAL_TABLET | Freq: Two times a day (BID) | ORAL | Status: DC
  Administered 2021-03-19: 1 via ORAL
  Filled 2021-03-19: qty 1

## 2021-03-19 NOTE — Discharge Summary (Signed)
Physician Discharge Summary   Patient ID: SILVERIA BOTZ MRN: [id number] DOB/AGE: 1946/07/15 75 y.o.  Admit date: 03/14/2021 Discharge date: 03/19/2021  Primary Care Physician:  Dorothey Baseman, MD   Recommendations for Outpatient Follow-up:  1. Follow up with PCP in 1-2 weeks 2. Aspirin 81 mg for 1 more day tomorrow, then patient will start Eliquis 5 mg twice daily for paroxysmal A. fib.  Continue Plavix 75 mg daily  Home Health: Home health PT OT, home health aide Equipment/Devices:   Discharge Condition: stable  CODE STATUS: FULL Diet recommendation: Heart healthy diet   Discharge Diagnoses:      Acute embolic CVA . NSTEMI (non-ST elevated myocardial infarction) (HCC) . Syncope . Hypokalemia . Hypertension . Hyperlipidemia   Consults:   Cardiology Neurology    Allergies:  No Known Allergies   DISCHARGE MEDICATIONS: Allergies as of 03/19/2021   No Known Allergies     Medication List    STOP taking these medications   losartan-hydrochlorothiazide 100-12.5 MG tablet Commonly known as: HYZAAR   valsartan-hydrochlorothiazide 320-25 MG tablet Commonly known as: DIOVAN-HCT     TAKE these medications   amoxicillin-clavulanate 875-125 MG tablet Commonly known as: AUGMENTIN Take 1 tablet by mouth 2 (two) times daily for 7 days.   apixaban 5 MG Tabs tablet Commonly known as: ELIQUIS Take 1 tablet (5 mg total) by mouth 2 (two) times daily. Start taking on: Mar 21, 2021   aspirin EC 81 MG tablet Take 1 tablet (81 mg total) by mouth daily for 1 day. Swallow whole. For 1 more day on 03/20/2021. What changed:   additional instructions  Another medication with the same name was removed. Continue taking this medication, and follow the directions you see here.   atorvastatin 80 MG tablet Commonly known as: LIPITOR Take 1 tablet (80 mg total) by mouth daily.   b complex vitamins capsule Take 1 capsule by mouth daily.   Cholecalciferol 25 MCG (1000 UT)  tablet Take 1 tablet by mouth daily.   clopidogrel 75 MG tablet Commonly known as: PLAVIX Take 1 tablet (75 mg total) by mouth daily with breakfast.   losartan 50 MG tablet Commonly known as: COZAAR Take 1 tablet (50 mg total) by mouth daily.   metoprolol tartrate 25 MG tablet Commonly known as: LOPRESSOR Take 1 tablet (25 mg total) by mouth 2 (two) times daily.   Multi-Vitamin tablet Take 1 tablet by mouth daily.            Durable Medical Equipment  (From admission, onward)         Start     Ordered   03/18/21 1228  DME Walker  Once       Comments: youth  Question Answer Comment  Walker: With 5 Inch Wheels   Patient needs a walker to treat with the following condition Debility      03/18/21 1227           Brief H and P:  Patient is a 74 year oldfemalewith hypertension, hyperlipidemiapresented to ED with syncope and was found to have NSTEMI.  Patient reportedly was at home on the afternoon of admission when she leaned forward to pick up something on the floor when she experienced loss of consciousness, witnessed by her family.  Patient fell straight forward hitting her head on the floor.  She was completely unconscious and nonresponsive per the family, contacted EMS.  Patient was brought to ED and received full dose aspirin in route. Patient denied any prior syncopal  events similar to above.  Denied any prodrome leading to the loss of consciousness.  Denied any recent or subsequent chest pain, shortness of breath, palpitation, diaphoresis, nausea or vomiting.  No known history of CAD. Patient was admitted for further work-up  Hospital Course:   NSTEMI -Patient was found to have elevated troponin following the syncopal episode.  Initial EKG changes ST depression in lead II, 3, aVF resolved.  Cardiology was consulted.   -Patient underwent cardiac cath which showed occluded proximal RCA with left-to-right collaterals, calcified 60% stenosis mid LAD and 60%  stenosis of proximal OM1. -Medical therapy was recommended  Syncope -Atypical presentation, received brief bystander CPR otherwise no evidence for VT or VF. -Initial ECG by EMS revealed atrial fibrillation at a rate of 106 and since then has been in normal sinus rhythm during hospitalization. -Due to acute CVA, cardiology and neurology recommended anticoagulation due to high risk of stroke -Cardiology to place Holter monitor  Acute embolic CVA Patient was noted to have some delirium post cath and mild right upper extremity weakness  -MRI showed infarcts to left parietal lobe, medial right thalamus, left cerebellar hemisphere.  -Patient was on aspirin and Plavix, statin. -Underwent CT angiogram head and neck no emergent large vessel occlusion approximately significant stenosis.  Incidentally showed wedge-shaped juxtapleural opacities in the partially imaged right upper lobe and left lower lobe, possibly aspiration and/or pneumonia however rule out PE. -Lipid panel showed LDL 82, hemoglobin A1c 5.7 -PT OT evaluation recommended home health PT OT -Neurology was consulted, who also discussed with cardiology.  Recommended starting Eliquis 5 mg twice daily on 5/6 for the episode of atrial fibrillation, may be the cause of patient's stroke. -Patient will discontinue aspirin after 1 day and continue Plavix for CAD.  Continue statin -Per cardiology no TEE. -CT angiogram chest showed no PE  Aspiration pneumonia -CTA showed no acute pulmonary embolism, however showed scattered groundglass nodule in lower lobe and small focus of consolidation in the posterior right upper lobe -Placed on Augmentin for 1 week  Hypokalemia -Improved  Hypertension -Currently stable, continue metoprolol  Management and plan discussed with patient and patient's son, Apolinar Junes in detail  Day of Discharge S: No acute complaints, feels close to her baseline, hoping to be discharged today  BP 137/72 (BP Location: Left  Arm)   Pulse 63   Temp 98 F (36.7 C)   Resp 16   Ht  (1.473 m)   Wt 48.3 kg   SpO2 99%   BMI 22.24 kg/m   Physical Exam: General: Alert and awake oriented x3 not in any acute distress. HEENT: anicteric sclera, pupils reactive to light and accommodation CVS: S1-S2 clear no murmur rubs or gallops Chest: clear to auscultation bilaterally, no wheezing rales or rhonchi Abdomen: soft nontender, nondistended, normal bowel sounds Extremities: no cyanosis, clubbing or edema noted bilaterally Neuro: Cranial nerves II-XII intact, no focal neurological deficits    Get Medicines reviewed and adjusted: Please take all your medications with you for your next visit with your Primary MD  Please request your Primary MD to go over all hospital tests and procedure/radiological results at the follow up. Please ask your Primary MD to get all Hospital records sent to his/her office.  If you experience worsening of your admission symptoms, develop shortness of breath, life threatening emergency, suicidal or homicidal thoughts you must seek medical attention immediately by calling 911 or calling your MD immediately  if symptoms less severe.  You must read complete instructions/literature  along with all the possible adverse reactions/side effects for all the Medicines you take and that have been prescribed to you. Take any new Medicines after you have completely understood and accept all the possible adverse reactions/side effects.   Do not drive when taking pain medications.   Do not take more than prescribed Pain, Sleep and Anxiety Medications  Special Instructions: If you have smoked or chewed Tobacco  in the last 2 yrs please stop smoking, stop any regular Alcohol  and or any Recreational drug use.  Wear Seat belts while driving.  Please note  You were cared for by a hospitalist during your hospital stay. Once you are discharged, your primary care physician will handle any further medical  issues. Please note that NO REFILLS for any discharge medications will be authorized once you are discharged, as it is imperative that you return to your primary care physician (or establish a relationship with a primary care physician if you do not have one) for your aftercare needs so that they can reassess your need for medications and monitor your lab values.   The results of significant diagnostics from this hospitalization (including imaging, microbiology, ancillary and laboratory) are listed below for reference.      Procedures/Studies:  CT ANGIO HEAD NECK W WO CM  Result Date: 03/18/2021 CLINICAL DATA:  Stroke/TIA. EXAM: CT ANGIOGRAPHY HEAD AND NECK TECHNIQUE: Multidetector CT imaging of the head and neck was performed using the standard protocol during bolus administration of intravenous contrast. Multiplanar CT image reconstructions and MIPs were obtained to evaluate the vascular anatomy. Carotid stenosis measurements (when applicable) are obtained utilizing NASCET criteria, using the distal internal carotid diameter as the denominator. CONTRAST:  65mL OMNIPAQUE IOHEXOL 350 MG/ML SOLN COMPARISON:  MRI from the same day.  CT head March 14 2021. FINDINGS: CT HEAD FINDINGS Brain: Infarcts in the superior left cerebellum and right thalamus with associated hypodensity. Additional small cortical infarcts in the left parietal lobe were better characterized on same day MRI. No substantial mass effect. No acute hemorrhage. No midline shift. Basal cisterns are patent. Additional mild patchy white matter hypoattenuation, likely the sequela of chronic microvascular ischemic disease. No mass lesion. No hydrocephalus. Vascular: See below. Skull: No acute fracture. Sinuses: Mild mucosal thickening of scattered ethmoid air cells and the left sphenoid sinus. Orbits: No evidence of acute abnormality in the visualized orbits. Review of the MIP images confirms the above findings CTA NECK FINDINGS Aortic arch:  Calcific atherosclerosis of the aorta and its branch vessels. Great vessel origins are patent. Right carotid system: No evidence of dissection, stenosis (50% or greater) or occlusion. Mild atherosclerosis at the carotid bifurcation. Left carotid system: No evidence of dissection, stenosis (50% or greater) or occlusion. Mild to moderate mixed calcific and noncalcific atherosclerosis at the carotid bifurcation without greater than 50% stenosis. Vertebral arteries: Co dominant. Mild narrowing of the right vertebral artery origin secondary to calcific atherosclerosis. Otherwise, patent vertebral arteries. Skeleton: Multilevel moderate degenerative change and severe left sided facet arthropathy. Retrolisthesis of C3 on C4 anterolisthesis of C4 on C5, favor degenerative given facet arthropathy at these levels. Other neck: No acute abnormality. Upper chest: Wedge-shaped, juxtapleural opacities in the partially imaged right upper lobe and left lower lobe. Review of the MIP images confirms the above findings CTA HEAD FINDINGS Anterior circulation: Calcific atherosclerosis of bilateral cavernous and paraclinoid ICAs without evidence of greater than 50% stenosis. Bilateral MCAs and ACAs are patent without evidence of hemodynamically significant proximal stenosis. 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 Posterior circulation: Mild narrowing of bilateral proximal intradural vertebral arteries and the proximal basilar artery due to calcific atherosclerosis. No large vessel occlusion or proximal hemodynamically significant stenosis. Venous sinuses: As permitted by contrast timing, patent. Review of the MIP images confirms the above findings IMPRESSION: CT Head: 1. Edema associated with known infarcts in the left cerebellum and right thalamus. Additional small cortical left parietal infarcts were better characterized on same day MRI. 2. No evidence of acute  hemorrhage or substantial mass effect. CTA Head: 1. No emergent large vessel occlusion or proximal hemodynamically significant stenosis. 2. Mild narrowing of bilateral intracranial ICAs, intradural vertebral arteries, and the basilar artery due to calcific atherosclerosis. 3. 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 CTA Neck: 1. No hemodynamically significant stenosis. 2. Mild-to-moderate left carotid bifurcation atherosclerosis without greater than 50% narrowing. 3. Mild right vertebral artery origin stenosis. 4. Wedge-shaped, juxtapleural opacities in the partially imaged right upper lobe and left lower lobe. While this finding could be secondary to aspiration and/or pneumonia, recommend CTA of the chest to fully evaluate and to exclude pulmonary embolism with pulmonary infarction (particularly given findings concerning for embolic strokes on the same day MRI). Findings and recommendations discussed with Dr. Ashok Pall via telephone at 5:45 p.m. Electronically Signed   By: Feliberto Harts MD   On: 03/18/2021 17:53   CT Head Wo Contrast  Result Date: 03/14/2021 CLINICAL DATA:  Syncopal episode EXAM: CT HEAD WITHOUT CONTRAST TECHNIQUE: Contiguous axial images were obtained from the base of the skull through the vertex without intravenous contrast. COMPARISON:  None. FINDINGS: Brain: Ventricles are normal in size. No mass, hemorrhage, edema or other evidence of acute parenchymal abnormality. No extra-axial hemorrhage. Vascular: Chronic calcified atherosclerotic changes of the large vessels at the skull base. No unexpected hyperdense vessel. Skull: Normal. Negative for fracture or focal lesion. Sinuses/Orbits: No acute finding. Other: None. IMPRESSION: Negative head CT. No intracranial mass, hemorrhage or edema. Electronically Signed   By: Bary Richard M.D.   On: 03/14/2021 16:19   CT ANGIO CHEST PE W OR WO CONTRAST  Result Date: 03/19/2021 CLINICAL DATA:  PE suspected. Elevated D-dimer. New  cerebrovascular accident EXAM: CT ANGIOGRAPHY CHEST WITH CONTRAST TECHNIQUE: Multidetector CT imaging of the chest was performed using the standard protocol during bolus administration of intravenous contrast. Multiplanar CT image reconstructions and MIPs were obtained to evaluate the vascular anatomy. CONTRAST:  96mL OMNIPAQUE IOHEXOL 350 MG/ML SOLN COMPARISON:  None. FINDINGS: Cardiovascular: No filling defects within the pulmonary arteries to suggest acute pulmonary embolism. Mediastinum/Nodes: No filling defects within the pulmonary arteries to suggest acute pulmonary embolism. Lungs/Pleura: Multiple rounded ground-glass nodules primarily in the posterior lower lobes . Rounded nodules are approximately 1 cm. For example 10 mm rounded ground-glass nodule in the RIGHT lower lobe on image 42/6. Small focus consolidation in the posterior aspect of the RIGHT upper lobe measuring 1.8 x 1.0 cm (image 35/6). Upper Abdomen: Limited view of the liver, kidneys, pancreas are unremarkable. Normal adrenal glands. Musculoskeletal: No aggressive osseous lesion. Review of the MIP images confirms the above findings. IMPRESSION: 1. No evidence acute pulmonary embolism. 2. Scattered round ground-glass nodule in lower lobe and small focus consolidation in the posterior RIGHT upper lobe. Findings most consistent multifocal infection including atypical infections. Electronically Signed   By: Genevive Bi M.D.   On: 03/19/2021 09:58   MR BRAIN WO CONTRAST  Result Date: 03/18/2021 CLINICAL DATA:  Neuro deficit, acute, stroke suspected. EXAM: MRI HEAD WITHOUT CONTRAST TECHNIQUE: Multiplanar, multiecho pulse sequences of the brain and surrounding structures were obtained without intravenous contrast. COMPARISON:  Noncontrast head CT 03/14/2021. FINDINGS: Brain: Mild intermittent motion degradation. Mild cerebral and cerebellar atrophy. Small patchy acute cortical infarcts within the left parietal lobe (with involvement of the left  postcentral gyrus). 11 mm acute infarct within the medial right thalamus. 14 mm acute infarct within the medial aspect of the superior left cerebellar hemisphere. Mild multifocal T2/FLAIR hyperintensity within the cerebral white matter and basal ganglia bilaterally, nonspecific but compatible with chronic small vessel ischemic disease. No evidence of intracranial mass. No chronic intracranial blood products. No extra-axial fluid collection. No midline shift. Vascular: Expected proximal arterial flow voids. Skull and upper cervical spine: No focal marrow lesion. Incompletely assessed upper cervical spondylosis. Sinuses/Orbits: Visualized orbits show no acute finding. Mild bilateral ethmoid, sphenoid and maxillary sinus mucosal thickening. IMPRESSION: Patchy small acute cortical infarcts within the left parietal lobe (with involvement of the postcentral gyrus). 11 mm acute infarct within the medial right thalamus. 14 mm acute infarct within the medial aspect of the superior left cerebellar hemisphere. Given the involvement of multiple vascular territories, consider an embolic process. Background mild parenchymal atrophy and chronic small vessel ischemic disease. Mild paranasal sinus mucosal thickening. Electronically Signed   By: Jackey Loge DO   On: 03/18/2021 15:09   CARDIAC CATHETERIZATION  Result Date: 03/17/2021  Prox RCA lesion is 100% stenosed.  Mid LAD lesion is 50% stenosed.  Prox LAD to Mid LAD lesion is 60% stenosed.  1st Diag lesion is 50% stenosed.  Prox Cx lesion is 50% stenosed.  1st Mrg lesion is 60% stenosed.  1.  Non-ST elevation myocardial infarction 2.  One-vessel coronary artery disease with occluded proximal RCA with left-to-right collaterals, calcified 60% stenosis mid LAD, 60% stenosis proximal OM1 Recommendations 1.  Medical therapy, add Plavix 75 mg daily 2.  Aggressive risk factor modification 3.  Follow-up in 1 week   US Venous Img Lower Bilateral (DVT)  Result Date:  03/18/2021 CLINICAL DATA:  Pulmonary embolism, hypoxia EXAM: BILATERAL LOWER EXTREMITY VENOUS DOPPLER ULTRASOUND TECHNIQUE: Gray-scale sonography with compression, as well as color and duplex ultrasound, were performed to evaluate the deep venous system(s) from the level of the common femoral vein through the popliteal and proximal calf veins. COMPARISON:  None. FINDINGS: VENOUS Normal compressibility of the common femoral, superficial femoral, and popliteal veins, as well as the visualized calf veins. Visualized portions of profunda femoral vein and great saphenous vein unremarkable. No filling defects to suggest DVT on grayscale or color Doppler imaging. Doppler waveforms show normal direction of venous flow, normal respiratory plasticity and response to augmentation. OTHER None. Limitations: none IMPRESSION: Negative. Electronically Signed   By: Helyn Numbers MD   On: 03/18/2021 19:30   DG Chest Portable 1 View  Result Date: 03/14/2021 CLINICAL DATA:  Syncopal episode EXAM: PORTABLE CHEST 1 VIEW COMPARISON:  None. FINDINGS: Heart size is normal. There is atherosclerotic calcification of the aorta. Pulmonary vascularity is normal. The lungs are clear. No acute regional bone finding. IMPRESSION: No active disease. Aortic atherosclerosis. Electronically Signed   By: Paulina Fusi M.D.   On: 03/14/2021 17:34   ECHOCARDIOGRAM COMPLETE  Result Date: 03/16/2021    ECHOCARDIOGRAM REPORT   Patient Name:   BRIAHNA PESCADOR Date of Exam: 03/15/2021 Medical Rec #:  [id number]      Height:       58.0 in Accession #:  [id number]     Weight:       110.2 lb Date of Birth:  [birth year]      BSA:          1.414 m Patient Age:    74 years       BP:           148/75 mmHg Patient Gender: F              HR:           65 bpm. Exam Location:  [facility] Procedure: 2D Echo, Cardiac Doppler and Color Doppler Indications:     Acute myocardial infarction, unspecified I21.9  History:         Patient has no prior history of Echocardiogram  examinations.                  Arrythmias:Atrial Fibrillation; Risk Factors:Hypertension.  Sonographer:     Neysa Bonito Roar Referring Phys:  [id number] Angie Fava Diagnosing Phys: Marcina Millard MD IMPRESSIONS  1. Left ventricular ejection fraction, by estimation, is 50 to 55%. The left ventricle has low normal function. The left ventricle demonstrates regional wall motion abnormalities (see scoring diagram/findings for description). Left ventricular diastolic  parameters were normal.  2. Right ventricular systolic function is normal. The right ventricular size is normal.  3. The mitral valve is normal in structure. Mild mitral valve regurgitation. No evidence of mitral stenosis.  4. Tricuspid valve regurgitation is mild to moderate.  5. The aortic valve is normal in structure. Aortic valve regurgitation is mild to moderate. No aortic stenosis is present.  6. The inferior vena cava is normal in size with greater than 50% respiratory variability, suggesting right atrial pressure of 3 mmHg. FINDINGS  Left Ventricle: Left ventricular ejection fraction, by estimation, is 50 to 55%. The left ventricle has low normal function. The left ventricle demonstrates regional wall motion abnormalities. The left ventricular internal cavity size was normal in size. There is no left ventricular hypertrophy. Left ventricular diastolic parameters were normal.  LV Wall Scoring: The apex is hypokinetic. Right Ventricle: The right ventricular size is normal. No increase in right ventricular wall thickness. Right ventricular systolic function is normal. Left Atrium: Left atrial size was normal in size. Right Atrium: Right atrial size was normal in size. Pericardium: There is no evidence of pericardial effusion. Mitral Valve: The mitral valve is normal in structure. Mild mitral valve regurgitation. No evidence of mitral valve stenosis. Tricuspid Valve: The tricuspid valve is normal in structure. Tricuspid valve regurgitation is mild  to moderate. No evidence of tricuspid stenosis. Aortic Valve: The aortic valve is normal in structure. Aortic valve regurgitation is mild to moderate. No aortic stenosis is present. Aortic valve peak gradient measures 5.2 mmHg. Pulmonic Valve: The pulmonic valve was normal in structure. Pulmonic valve regurgitation is not visualized. No evidence of pulmonic stenosis. Aorta: The aortic root is normal in size and structure. Venous: The inferior vena cava is normal in size with greater than 50% respiratory variability, suggesting right atrial pressure of 3 mmHg. IAS/Shunts: No atrial level shunt detected by color flow Doppler.  LEFT VENTRICLE PLAX 2D LVIDd:         3.61 cm  Diastology LVIDs:         2.73 cm  LV e' medial:    7.94 cm/s LV PW:         0.86 cm  LV E/e' medial:  12.6 LV IVS:  1.04 cm  LV e' lateral:   11.50 cm/s LVOT diam:     1.60 cm  LV E/e' lateral: 8.7 LVOT Area:     2.01 cm  RIGHT VENTRICLE RV Mid diam:    2.91 cm RV S prime:     10.70 cm/s TAPSE (M-mode): 1.2 cm LEFT ATRIUM             Index       RIGHT ATRIUM           Index LA diam:        3.70 cm 2.62 cm/m  RA Area:     10.40 cm LA Vol (A2C):   57.0 ml 40.32 ml/m RA Volume:   20.80 ml  14.71 ml/m LA Vol (A4C):   47.7 ml 33.74 ml/m LA Biplane Vol: 54.3 ml 38.41 ml/m  AORTIC VALVE                PULMONIC VALVE AV Area (Vmax): 1.98 cm    PV Vmax:        0.66 m/s AV Vmax:        114.00 cm/s PV Peak grad:   1.8 mmHg AV Peak Grad:   5.2 mmHg    RVOT Peak grad: 1 mmHg LVOT Vmax:      112.00 cm/s  AORTA Ao Root diam: 2.90 cm MITRAL VALVE                TRICUSPID VALVE MV Area (PHT): 4.36 cm     TR Peak grad:   21.0 mmHg MV Decel Time: 174 msec     TR Vmax:        229.00 cm/s MV E velocity: 100.00 cm/s MV A velocity: 104.00 cm/s  SHUNTS MV E/A ratio:  0.96         Systemic Diam: 1.60 cm MV A Prime:    10.4 cm/s Marcina MillardAlexander Paraschos MD Electronically signed by Marcina MillardAlexander Paraschos MD Signature Date/Time: 03/16/2021/10:06:17 AM    Final         LAB RESULTS: Basic Metabolic Panel: Recent Labs  Lab 03/18/21 0548 03/19/21 0317  NA 138 135  K 3.6 4.0  CL 105 103  CO2 21* 24  GLUCOSE 109* 100*  BUN 22 17  CREATININE 0.82 0.78  CALCIUM 8.9 9.0  MG 2.2 2.1   Liver Function Tests: Recent Labs  Lab 03/14/21 1531  AST 62*  ALT 44  ALKPHOS 80  BILITOT 0.9  PROT 6.9  ALBUMIN 4.1   No results for input(s): LIPASE, AMYLASE in the last 168 hours. No results for input(s): AMMONIA in the last 168 hours. CBC: Recent Labs  Lab 03/15/21 0454 03/16/21 0352  WBC 7.0 8.2  HGB 10.4* 10.8*  HCT 31.0* 32.5*  MCV 92.5 93.7  PLT 204 206   Cardiac Enzymes: No results for input(s): CKTOTAL, CKMB, CKMBINDEX, TROPONINI in the last 168 hours. BNP: Invalid input(s): POCBNP CBG: Recent Labs  Lab 03/17/21 1132  GLUCAP 102*       Disposition and Follow-up: Discharge Instructions    Amb Referral to Cardiac Rehabilitation   Complete by: As directed    Diagnosis: NSTEMI   After initial evaluation and assessments completed: Virtual Based Care may be provided alone or in conjunction with Phase 2 Cardiac Rehab based on patient barriers.: Yes   Diet - low sodium heart healthy   Complete by: As directed    Discharge instructions   Complete by: As directed    Continue aspirin and  Plavix for 1 more day on 03/20/2021.  Start Eliquis 5 mg twice a day on 03/21/2021 and stop aspirin.  Continue Plavix.   Increase activity slowly   Complete by: As directed    Increase activity slowly   Complete by: As directed        DISPOSITION: Home with home health   DISCHARGE FOLLOW-UP  Follow-up Information    Dorothey Baseman, MD.   Specialty: Family Medicine Why: Patient will need to make a follow up appointment. Contact information: [address] [place] [address] [phone]        Marcina Millard, MD. Call on 03/28/2021.   Specialty: Cardiology Why: @ 10:45am Contact information: [address]  [address] [facility] [place] [address] [phone]        Jobe Gibbon, [facility] Follow up.   Specialty: Home Health Services Why: Physical therapy, occupational therapy, nurse aid Contact information: [address] [place] [address] [phone]                Time coordinating discharge:  35 minutes  Signed:   Thad Ranger M.D. [facility] 03/19/2021, 2:56 PM

## 2021-03-19 NOTE — Plan of Care (Signed)
DISCHARGE NOTE HOME Lauri Purdum Munoz to be discharged home per MD order. Discussed prescriptions and follow up appointments with the patient. Medication list explained in detail. Patient verbalized understanding.  Skin clean, dry and intact without evidence of skin break down, no evidence of skin tears noted. IV catheter discontinued intact. Site without signs and symptoms of complications. Dressing and pressure applied. Pt denies pain at the site currently. No complaints noted.  Patient free of lines, drains, and wounds.   An After Visit Summary (AVS) was printed and given to the patient. Patient walked out with son, and discharged home via private auto.  Arlice Colt, RN

## 2021-03-19 NOTE — Evaluation (Signed)
Occupational Therapy Evaluation Patient Details Name: Victoria [name] MRN: [id number] DOB: 10-01-1946 Today's Date: 03/19/2021    History of Present Illness Pt is a 75 y.o. female presenting to hospital 4/29 with loss of consciousness and CPR (pt bent over and had LOC falling forward hitting head on ground; family started CPR for about 2-3 minutes d/t pt did not appear to be breathing and unable palpate pulse.  Per MRI of head 5/3: "Patchy small acute cortical infarcts within the left parietal lobe (with involvement of the postcentral gyrus). 11 mm acute infarct  within the medial right thalamus. 14 mm acute infarct within the  medial aspect of the superior left cerebellar hemisphere. Given the  involvement of multiple vascular territories, consider an embolic  process."  Pt admitted with NSTEMI, syncope, paroxysmal a-fib, hypokalemia, essential htn, and HLD.  PMH includes arthritis, htn, HLD.   Clinical Impression   Ms Kizziah was seen for OT evaluation this date. Prior to hospital admission, pt was Independent for mobility and ADLs. Pt lives c husband and son in home c 5 STE. Pt presents to acute OT demonstrating impaired ADL performance and functional mobility 2/2 decreased safety awareness and functional balance/endurance deficits. Pt currently requires MOD I don/doff B socks seated EOB. SBA + RW for ADL t/f and functional reach outside BOS. Minor LOB turning to sit in chair, able to self-correct. Pt instructed on OT role, DME recs, d/c recs, falls prevention, ECS, night time toileting routine, and home/routine modifications. Pt would benefit from skilled OT to address noted impairments and functional limitations (see below for any additional details) in order to maximize safety and independence while minimizing falls risk and caregiver burden. Upon hospital discharge, recommend no OT follow up.     Follow Up Recommendations  No OT follow up    Equipment Recommendations  None recommended by OT     Recommendations for Other Services       Precautions / Restrictions Precautions Precautions: Fall Restrictions Weight Bearing Restrictions: No Other Position/Activity Restrictions: R radial cardiac cath precautions      Mobility Bed Mobility Overal bed mobility: Independent             General bed mobility comments: Supine to sit without any noted difficulties    Transfers Overall transfer level: Needs assistance Equipment used: None Transfers: Sit to/from Stand Sit to Stand: Supervision         General transfer comment: SBA for safety but pt steady with transfers    Balance Overall balance assessment: Needs assistance Sitting-balance support: No upper extremity supported;Feet supported Sitting balance-Leahy Scale: Normal Sitting balance - Comments: steady sitting reaching outside BOS   Standing balance support: During functional activity;Single extremity supported Standing balance-Leahy Scale: Good Standing balance comment: SBA reaching into above head height and knee height cabinet                           ADL either performed or assessed with clinical judgement   ADL Overall ADL's : Needs assistance/impaired                                       General ADL Comments: MOD I don/doff B socks seated EOB. SBA + RW for ADL t/f and functional reach outside BOS  Pertinent Vitals/Pain Pain Assessment: No/denies pain     Hand Dominance     Extremity/Trunk Assessment Upper Extremity Assessment Upper Extremity Assessment: Overall WFL for tasks assessed [facility] and Sensation [facility])   Lower Extremity Assessment Lower Extremity Assessment: Overall WFL for tasks assessed       Communication Communication Communication: No difficulties   Cognition Arousal/Alertness: Awake/alert Behavior During Therapy: WFL for tasks assessed/performed Overall Cognitive Status: Within Functional Limits for tasks assessed                                         Exercises Exercises: Other exercises Other Exercises Other Exercises: Pt educated re: OT role, DME recs, d/c recs, falls prevention, ECS, night time toileting routine, home/routine modifications Other Exercises: LBD, sit<>stand, sitting/standing balance/tolerance, functional reach, ~30 ft mobility   Shoulder Instructions      Home Living Family/patient expects to be discharged to:: Private residence Living Arrangements: Spouse/significant other;Children Available Help at Discharge: Family;Available 24 hours/day Type of Home: House Home Access: Stairs to enter [employer] of Steps: 5 Entrance Stairs-Rails: Left Home Layout: One level               Home Equipment: Walker - 2 wheels;Walker - 4 wheels;Cane - single point;Bedside commode   Additional Comments: husbands RW/4WW      Prior Functioning/Environment Level of Independence: Independent        Comments: No h/o falls except for recent fall just prior to hospitalizaiton. Pt's husband is cancer pt        OT Problem List: Impaired balance (sitting and/or standing);Decreased activity tolerance;Decreased safety awareness      OT Treatment/Interventions: Self-care/ADL training;Therapeutic exercise;Energy conservation;DME and/or AE instruction;Therapeutic activities;Patient/family education;Balance training    OT Goals(Current goals can be found in the care plan section) Acute Rehab OT Goals Patient Stated Goal: to go home OT Goal Formulation: With patient Time For Goal Achievement: 04/02/21 Potential to Achieve Goals: Good ADL Goals Pt Will Perform Lower Body Dressing: Independently;sit to/from stand Pt Will Transfer to Toilet: Independently;ambulating;regular height toilet Additional ADL Goal #1: Pt will Independently verbalize plan to implement x3 falls prevention strategies  OT Frequency: Min 1X/week    AM-PAC OT "6 Clicks" Daily Activity     Outcome  Measure Help from another person eating meals?: None Help from another person taking care of personal grooming?: A Little Help from another person toileting, which includes using toliet, bedpan, or urinal?: A Little Help from another person bathing (including washing, rinsing, drying)?: A Little Help from another person to put on and taking off regular upper body clothing?: None Help from another person to put on and taking off regular lower body clothing?: None 6 Click Score: 21   End of Session    Activity Tolerance: Patient tolerated treatment well Patient left: with call bell/phone within reach;in chair;with chair alarm set  OT Visit Diagnosis: Other abnormalities of gait and mobility (R26.89);Muscle weakness (generalized) (M62.81)                Time: 5366-4403 OT Time Calculation (min): 17 min Charges:  OT General Charges $OT Visit: 1 Visit OT Evaluation $OT Eval Low Complexity: 1 Low OT Treatments $Self Care/Home Management : 8-22 mins   Kathie Dike, M.S. OTR/L  03/19/21, 2:22 PM  ascom [phone]

## 2021-03-19 NOTE — Progress Notes (Addendum)
[facility] Cardiology    SUBJECTIVE: The patient reports feeling fine this morning; she denies recurrent neurological deficits. Her main deficit was favoring her right side when working with physical therapy. She will be evaluated by PT again today. She denies chest pain, shortness of breath, or palpitations.    Vitals:   03/18/21 1549 03/18/21 1931 03/19/21 0415 03/19/21 0721  BP: (!) 149/73 139/75 (!) 142/77 (!) 145/83  Pulse: 68 65 70 70  Resp: 18 19 19 16   Temp: 97.9 F (36.6 C) 97.8 F (36.6 C) 98.5 F (36.9 C) 97.8 F (36.6 C)  TempSrc:  Oral Oral Oral  SpO2: 98% 99% 99% 98%  Weight:      Height:         Intake/Output Summary (Last 24 hours) at 03/19/2021 0854 Last data filed at 03/19/2021 0600 Gross per 24 hour  Intake 360 ml  Output 800 ml  Net -440 ml      PHYSICAL EXAM  General: Well developed, well nourished, in no acute distress, sitting up on side of bed HEENT:  Normocephalic and atramatic Neck:  No JVD.  Lungs: Clear bilaterally to auscultation, normal effort of breathing on RA Heart: HRRR . Normal S1 and S2 without gallops or murmurs.  Abdomen: nondistended  Msk: no obvious deformities Extremities: No clubbing, cyanosis or edema.   Neuro: Alert and oriented X 3. No focal neurological deficits Psych:  Good affect, responds appropriately   LABS: Basic Metabolic Panel: Recent Labs    03/18/21 0548 03/19/21 0317  NA 138 135  K 3.6 4.0  CL 105 103  CO2 21* 24  GLUCOSE 109* 100*  BUN 22 17  CREATININE 0.82 0.78  CALCIUM 8.9 9.0  MG 2.2 2.1   Liver Function Tests: No results for input(s): AST, ALT, ALKPHOS, BILITOT, PROT, ALBUMIN in the last 72 hours. No results for input(s): LIPASE, AMYLASE in the last 72 hours. CBC: No results for input(s): WBC, NEUTROABS, HGB, HCT, MCV, PLT in the last 72 hours. Cardiac Enzymes: No results for input(s): CKTOTAL, CKMB, CKMBINDEX, TROPONINI in the last 72 hours. BNP: Invalid input(s): POCBNP D-Dimer: No results  for input(s): DDIMER in the last 72 hours. Hemoglobin A1C: No results for input(s): HGBA1C in the last 72 hours. Fasting Lipid Panel: Recent Labs    03/19/21 0317  CHOL 145  HDL 44  LDLCALC 82  TRIG 96  CHOLHDL 3.3   Thyroid Function Tests: No results for input(s): TSH, T4TOTAL, T3FREE, THYROIDAB in the last 72 hours.  Invalid input(s): FREET3 Anemia Panel: No results for input(s): VITAMINB12, FOLATE, FERRITIN, TIBC, IRON, RETICCTPCT in the last 72 hours.  CT ANGIO HEAD NECK W WO CM  Result Date: 03/18/2021 CLINICAL DATA:  Stroke/TIA. EXAM: CT ANGIOGRAPHY HEAD AND NECK TECHNIQUE: Multidetector CT imaging of the head and neck was performed using the standard protocol during bolus administration of intravenous contrast. Multiplanar CT image reconstructions and MIPs were obtained to evaluate the vascular anatomy. Carotid stenosis measurements (when applicable) are obtained utilizing NASCET criteria, using the distal internal carotid diameter as the denominator. CONTRAST:  72mL OMNIPAQUE IOHEXOL 350 MG/ML SOLN COMPARISON:  MRI from the same day.  CT head March 14 2021. FINDINGS: CT HEAD FINDINGS Brain: Infarcts in the superior left cerebellum and right thalamus with associated hypodensity. Additional small cortical infarcts in the left parietal lobe were better characterized on same day MRI. No substantial mass effect. No acute hemorrhage. No midline shift. Basal cisterns are patent. Additional mild patchy white matter hypoattenuation,  likely the sequela of chronic microvascular ischemic disease. No mass lesion. No hydrocephalus. Vascular: See below. Skull: No acute fracture. Sinuses: Mild mucosal thickening of scattered ethmoid air cells and the left sphenoid sinus. Orbits: No evidence of acute abnormality in the visualized orbits. Review of the MIP images confirms the above findings CTA NECK FINDINGS Aortic arch: Calcific atherosclerosis of the aorta and its branch vessels. Great vessel origins  are patent. Right carotid system: No evidence of dissection, stenosis (50% or greater) or occlusion. Mild atherosclerosis at the carotid bifurcation. Left carotid system: No evidence of dissection, stenosis (50% or greater) or occlusion. Mild to moderate mixed calcific and noncalcific atherosclerosis at the carotid bifurcation without greater than 50% stenosis. Vertebral arteries: Co dominant. Mild narrowing of the right vertebral artery origin secondary to calcific atherosclerosis. Otherwise, patent vertebral arteries. Skeleton: Multilevel moderate degenerative change and severe left sided facet arthropathy. Retrolisthesis of C3 on C4 anterolisthesis of C4 on C5, favor degenerative given facet arthropathy at these levels. Other neck: No acute abnormality. Upper chest: Wedge-shaped, juxtapleural opacities in the partially imaged right upper lobe and left lower lobe. Review of the MIP images confirms the above findings CTA HEAD FINDINGS Anterior circulation: Calcific atherosclerosis of bilateral cavernous and paraclinoid ICAs without evidence of greater than 50% stenosis. Bilateral MCAs and ACAs are patent without evidence of hemodynamically significant proximal stenosis. 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 Posterior circulation: Mild narrowing of bilateral proximal intradural vertebral arteries and the proximal basilar artery due to calcific atherosclerosis. No large vessel occlusion or proximal hemodynamically significant stenosis. Venous sinuses: As permitted by contrast timing, patent. Review of the MIP images confirms the above findings IMPRESSION: CT Head: 1. Edema associated with known infarcts in the left cerebellum and right thalamus. Additional small cortical left parietal infarcts were better characterized on same day MRI. 2. No evidence of acute hemorrhage or substantial mass effect. CTA Head: 1. No emergent large vessel occlusion  or proximal hemodynamically significant stenosis. 2. Mild narrowing of bilateral intracranial ICAs, intradural vertebral arteries, and the basilar artery due to calcific atherosclerosis. 3. 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 CTA Neck: 1. No hemodynamically significant stenosis. 2. Mild-to-moderate left carotid bifurcation atherosclerosis without greater than 50% narrowing. 3. Mild right vertebral artery origin stenosis. 4. Wedge-shaped, juxtapleural opacities in the partially imaged right upper lobe and left lower lobe. While this finding could be secondary to aspiration and/or pneumonia, recommend CTA of the chest to fully evaluate and to exclude pulmonary embolism with pulmonary infarction (particularly given findings concerning for embolic strokes on the same day MRI). Findings and recommendations discussed with Dr. Ashok Pall via telephone at 5:45 p.m. Electronically Signed   By: Feliberto Harts MD   On: 03/18/2021 17:53   MR BRAIN WO CONTRAST  Result Date: 03/18/2021 CLINICAL DATA:  Neuro deficit, acute, stroke suspected. EXAM: MRI HEAD WITHOUT CONTRAST TECHNIQUE: Multiplanar, multiecho pulse sequences of the brain and surrounding structures were obtained without intravenous contrast. COMPARISON:  Noncontrast head CT 03/14/2021. FINDINGS: Brain: Mild intermittent motion degradation. Mild cerebral and cerebellar atrophy. Small patchy acute cortical infarcts within the left parietal lobe (with involvement of the left postcentral gyrus). 11 mm acute infarct within the medial right thalamus. 14 mm acute infarct within the medial aspect of the superior left cerebellar hemisphere. Mild multifocal T2/FLAIR hyperintensity within the cerebral white matter and basal ganglia bilaterally, nonspecific but compatible with chronic small vessel ischemic disease. No evidence  of intracranial mass. No chronic intracranial blood products. No extra-axial fluid collection. No midline shift. Vascular:  Expected proximal arterial flow voids. Skull and upper cervical spine: No focal marrow lesion. Incompletely assessed upper cervical spondylosis. Sinuses/Orbits: Visualized orbits show no acute finding. Mild bilateral ethmoid, sphenoid and maxillary sinus mucosal thickening. IMPRESSION: Patchy small acute cortical infarcts within the left parietal lobe (with involvement of the postcentral gyrus). 11 mm acute infarct within the medial right thalamus. 14 mm acute infarct within the medial aspect of the superior left cerebellar hemisphere. Given the involvement of multiple vascular territories, consider an embolic process. Background mild parenchymal atrophy and chronic small vessel ischemic disease. Mild paranasal sinus mucosal thickening. Electronically Signed   By: Jackey LogeKyle  Golden DO   On: 03/18/2021 15:09   CARDIAC CATHETERIZATION  Result Date: 03/17/2021  Prox RCA lesion is 100% stenosed.  Mid LAD lesion is 50% stenosed.  Prox LAD to Mid LAD lesion is 60% stenosed.  1st Diag lesion is 50% stenosed.  Prox Cx lesion is 50% stenosed.  1st Mrg lesion is 60% stenosed.  1.  Non-ST elevation myocardial infarction 2.  One-vessel coronary artery disease with occluded proximal RCA with left-to-right collaterals, calcified 60% stenosis mid LAD, 60% stenosis proximal OM1 Recommendations 1.  Medical therapy, add Plavix 75 mg daily 2.  Aggressive risk factor modification 3.  Follow-up in 1 week   US Venous Img Lower Bilateral (DVT)  Result Date: 03/18/2021 CLINICAL DATA:  Pulmonary embolism, hypoxia EXAM: BILATERAL LOWER EXTREMITY VENOUS DOPPLER ULTRASOUND TECHNIQUE: Gray-scale sonography with compression, as well as color and duplex ultrasound, were performed to evaluate the deep venous system(s) from the level of the common femoral vein through the popliteal and proximal calf veins. COMPARISON:  None. FINDINGS: VENOUS Normal compressibility of the common femoral, superficial femoral, and popliteal veins, as well as  the visualized calf veins. Visualized portions of profunda femoral vein and great saphenous vein unremarkable. No filling defects to suggest DVT on grayscale or color Doppler imaging. Doppler waveforms show normal direction of venous flow, normal respiratory plasticity and response to augmentation. OTHER None. Limitations: none IMPRESSION: Negative. Electronically Signed   By: Helyn NumbersAshesh  Parikh MD   On: 03/18/2021 19:30     Echo LVEF 50-55%, mild to moderate valvular insufficiencies  TELEMETRY: sinus rhythm  ASSESSMENT AND PLAN:  Principal Problem:   NSTEMI (non-ST elevated myocardial infarction) [facility](HCC) Active Problems:   Syncope   Hypokalemia   Hypertension   Hyperlipidemia   1. Non-ST elevation myocardial infarction, elevated high-sensitivity troponin(996, 960,[id number],1288, 1049, 1047) following syncopal episode, initial ECG nondiagnostic with ST elevation in lead V2 and ST depression noted in leads II, III, aVF, V5 and V6, which resolved. Cardiac catheterization revealed occluded proximal RCA with left-to-right collaterals, calcified 60% stenosis mid LAD, and 60% stenosis proximal OM1.  Medical therapy recommended. 2.Syncope, atypical presentation, received brief bystander CPR, no evidence for VT or VF,initial ECG by EMS revealed atrial fibrillation at a rate of 106 bpm, been in sinus rhythm [facility] 3.Paroxysmal atrial fibrillation, brief episode following syncopal episode, in sinus rhythm 4.Hypokalemia,patient received IV potassium 5.Essential hypertension, on losartan/HCTZ at home 6.Hyperlipidemia,LDL 134 on 01/27/2021 7.  Stroke, brain MRI revealed Patchy small acute cortical infarcts within the left parietal lobe (with involvement of the postcentral gyrus). 11 mm acute infarct within the medial right thalamus. 14 mm acute infarct within the medial aspect of the superior left cerebellar hemisphere. Concerning for embolic source.  Head and neck CTA negative for  significant carotid stenosis.   Recommendations: 1. Recommend starting Eliquis 5 mg BID on 5/6 for the episode of atrial fibrillation in the ER, as this may be cause of patient's stroke. 2. Discontinue aspirin on Friday, continue Plavix for CAD 3. Continue high intensity statin 4. Will place 14-day Holter monitor prior to discharge for evaluation of subclinical atrial fibrillation as cause of stroke.  5. Follow up with Dr. Darrold Junker in 4 weeks to review results of Holter. Linq implant may be considered if Holter is unrevealing.  Sign off for now; please call/[facility] with any questions. Discussed with DR. Paraschos; plan made in collaboration with him.   Leanora Ivanoff, PA-C 03/19/2021 8:54 AM

## 2021-03-19 NOTE — Progress Notes (Signed)
Physical Therapy Treatment Patient Details Name: Victoria [name] MRN: [id number] DOB: 14-Nov-1946 Today's Date: 03/19/2021    History of Present Illness Pt is a 75 y.o. female presenting to hospital 4/29 with loss of consciousness and CPR (pt bent over and had LOC falling forward hitting head on ground; family started CPR for about 2-3 minutes d/t pt did not appear to be breathing and unable palpate pulse.  Per MRI of head 5/3: "Patchy small acute cortical infarcts within the left parietal lobe (with involvement of the postcentral gyrus). 11 mm acute infarct  within the medial right thalamus. 14 mm acute infarct within the  medial aspect of the superior left cerebellar hemisphere. Given the  involvement of multiple vascular territories, consider an embolic  process."  Pt admitted with NSTEMI, syncope, paroxysmal a-fib, hypokalemia, essential htn, and HLD.  PMH includes arthritis, htn, HLD.    PT Comments    Pt resting in bed upon PT arrival; agreeable to PT session.  Pt leaning a little to the R (varying extent of lean during session) with standing activities; pt ambulated with youth sized RW 160 feet and appearing steady and safe; trialed 160 feet of ambulation no AD d/t pt reporting not needing to use walker--pt with 1 loss of balance to L and 1 loss of balance to R with intermittent altered stepping pattern and veering to R in hallway (pt able to self correct--CGA provided for safety); then pt ambulated 160 feet with youth sized RW and again appearing steady and safe with walker use.  Pt educated on recommendation for RW use with ambulation for safety and balance d/t concerns for falls.  Even with education and trialing walking no AD vs RW, pt reporting that she can just hold onto furniture in home as needed and did not feel like she needed to use a walker.  Therapist educated pt on fall risk concerns and safety at home: pt reporting no concerns with her balance and being able to safely manage at home.   OT present with pt end of session.  No change in recommendations from yesterday's therapy session (pt and pt's son educated on recommendations in yesterdays therapy session and need for 24/7 assist d/t fall risk concerns).   Follow Up Recommendations  Home health PT;Supervision/Assistance - 24 hour     Equipment Recommendations  Rolling walker with 5" wheels (youth sized)    Recommendations for Other Services OT consult     Precautions / Restrictions Precautions Precautions: Fall Restrictions Weight Bearing Restrictions: Yes Other Position/Activity Restrictions: R radial cardiac cath precautions    Mobility  Bed Mobility Overal bed mobility: Independent             General bed mobility comments: Supine to sit without any noted difficulties    Transfers Overall transfer level: Needs assistance Equipment used: None Transfers: Sit to/from Stand Sit to Stand: Supervision         General transfer comment: SBA for safety but pt steady with transfers  Ambulation/Gait Ambulation/Gait assistance: Min guard Gait Distance (Feet):  (160 feet x3) Assistive device: None;Rolling walker (2 wheeled) (youth sized)   Gait velocity: decreased   General Gait Details: pt leaning a little to the R (varying extent of lean during session) with standing activities; pt ambulated with youth sized RW 160 feet and appearing steady and safe; trialed 160 feet of ambulation no AD--pt with 1 loss of balance to L and 1 loss of balance to R with intermittent altered stepping pattern and  veering to R (pt able to self correct--CGA provided for safety); then pt ambulated 160 feet with youth sized RW and again appearing steady and safe with walker use   Stairs Stairs: Yes Stairs assistance: Min guard Stair Management: One rail Left;Step to pattern;Forwards (use of L UE on railing) Number of Stairs: 5 General stair comments: steady with single UE support on railing   Wheelchair Mobility     Modified Rankin (Stroke Patients Only)       Balance Overall balance assessment: Needs assistance Sitting-balance support: No upper extremity supported;Feet supported Sitting balance-Leahy Scale: Normal Sitting balance - Comments: steady sitting reaching outside BOS   Standing balance support: No upper extremity supported;During functional activity Standing balance-Leahy Scale: Fair Standing balance comment: CGA for safety d/t gait impairments                            Cognition Arousal/Alertness: Awake/alert Behavior During Therapy: WFL for tasks assessed/performed Overall Cognitive Status: Within Functional Limits for tasks assessed                                        Exercises  Reviewed R radial cath precautions.    General Comments General comments (skin integrity, edema, etc.): No bleeding noted R radial cath procedure site beginning/end of session.  Nursing cleared pt for participation in physical therapy.  Pt agreeable to PT session.  Youth sized RW (already delivered to pt for home use) adjusted for pt fit during session.      Pertinent Vitals/Pain Pain Assessment: No/denies pain  Vitals (HR and O2 on room air) stable and WFL throughout treatment session.    Home Living                      Prior Function            PT Goals (current goals can now be found in the care plan section) Acute Rehab PT Goals Patient Stated Goal: to go home PT Goal Formulation: With patient Time For Goal Achievement: 04/01/21 Potential to Achieve Goals: Good Progress towards PT goals: Progressing toward goals    Frequency    7X/week      PT Plan Frequency needs to be updated (d/t diagnosis of stroke)    Co-evaluation              AM-PAC PT "6 Clicks" Mobility   Outcome Measure  Help needed turning from your back to your side while in a flat bed without using bedrails?: None Help needed moving from lying on your back to  sitting on the side of a flat bed without using bedrails?: None Help needed moving to and from a bed to a chair (including a wheelchair)?: A Little Help needed standing up from a chair using your arms (e.g., wheelchair or bedside chair)?: A Little Help needed to walk in hospital room?: A Little Help needed climbing 3-5 steps with a railing? : A Little 6 Click Score: 20    End of Session Equipment Utilized During Treatment: Gait belt Activity Tolerance: Patient tolerated treatment well Patient left: with call bell/phone within reach;with bed alarm set (sitting on edge of bed with OT present) Nurse Communication: Mobility status;Precautions PT Visit Diagnosis: Other abnormalities of gait and mobility (R26.89);Unsteadiness on feet (R26.81)     Time: 5361-4431 PT Time Calculation (min) (  ACUTE ONLY): 23 min  Charges:  $Gait Training: 23-37 mins                     [employer], PT 03/19/21, 2:22 PM

## 2021-03-19 NOTE — Evaluation (Signed)
Clinical/Bedside Swallow Evaluation Patient Details  Name: Victoria [name] MRN: [id number] Date of Birth: 01-11-1946  Today's Date: 03/19/2021 Time: SLP Start Time (ACUTE ONLY): 0900 SLP Stop Time (ACUTE ONLY): 0945 SLP Time Calculation (min) (ACUTE ONLY): 45 min  Past Medical History:  Past Medical History:  Diagnosis Date  . Arthritis   . Headache   . Hyperlipidemia   . Hypertension    Past Surgical History:  Past Surgical History:  Procedure Laterality Date  . COLONOSCOPY WITH PROPOFOL N/A 09/26/2015   Procedure: COLONOSCOPY WITH PROPOFOL;  Surgeon: Wallace Cullens, MD;  Location: [facility] ENDOSCOPY;  Service: Gastroenterology;  Laterality: N/A;  . LEFT HEART CATH AND CORONARY ANGIOGRAPHY N/A 03/17/2021   Procedure: LEFT HEART CATH AND CORONARY ANGIOGRAPHY;  Surgeon: Marcina Millard, MD;  Location: [facility] INVASIVE CV LAB;  Service: Cardiovascular;  Laterality: N/A;  . NO PAST SURGERIES    . TUBAL LIGATION     HPI:  Pt is a 75 y.o. female who lives at home w/ Husband (pt is her Husband'd Caregiver) presenting to hospital 4/29 with loss of consciousness and CPR (pt bent over and had LOC falling forward hitting head on ground; family started CPR for about 2-3 minutes d/t pt did not appear to be breathing and unable palpate pulse.  Per MRI of head 5/3: "Patchy small acute cortical infarcts within the left parietal lobe (with involvement of the postcentral gyrus). 11 mm acute infarct  within the medial right thalamus. 14 mm acute infarct within the  medial aspect of the superior left cerebellar hemisphere. Given the  involvement of multiple vascular territories, consider an embolic  process."  Pt admitted with NSTEMI, syncope, paroxysmal a-fib, hypokalemia, essential htn, and HLD.  PMH includes arthritis, htn, HLD.  Pt admitted for Non-ST elevation myocardial infarction; Syncope; Stroke.  CXR: clear.   Assessment / Plan / Recommendation Clinical Impression  Pt appears to present w/ adequate  oropharyngeal phase swallow w/ No overt oropharyngeal phase dysphagia noted, No neuromuscular deficits noted. Pt consumed po trials w/ No immediate, overt clinical s/s of aspiration during po trials. Pt has Chronic, intermittent throat clearing ongoing even Prior to po's/without po's -- pt stated she has Baseline "drainage, Phlegm" which causes her to feel she need to throat clear "all the time, and this has been going on for years". Pt appears at reduced risk for aspiration following general aspiration precautions. During po trials, pt consumed all consistencies w/ no overt coughing, decline in vocal quality, or change in respiratory presentation during/post trials. The intermittent, Baseline throat clearing did not increase or immediately follow po trials. Oral phase appeared [facility] w/ timely bolus management, mastication, and control of bolus propulsion for A-P transfer for swallowing. Oral clearing achieved w/ all trial consistencies. OM Exam appeared [facility] w/ no unilateral weakness noted. Speech Clear. Pt fed self w/ sitting EOB in room. Recommend continue a Regular consistency diet w/ well-Cut meats, moistened foods; Thin liquids - monitoring straw use as needed. Recommend general aspiration precautions, Pills Whole in Puree for easier swallowing if needed. Education given on Pills in Puree; food consistencies and easy to eat options; general aspiration precautions. Discussed increased Water hydration to thin Phlegm/mucous she c/o Baseline and to f/u w/ MD/ENT if needed. NSG to reconsult if any new needs arise. NSG agreed SLP Visit Diagnosis: Dysphagia, unspecified (R13.10)    Aspiration Risk   (reduced following precautions)    Diet Recommendation   Regular consistency diet w/ well-Cut meats, moistened foods; Thin liquids -  monitoring straw use as needed. Recommend general aspiration precautions. Increased Water, hydration to help thin Phlegm, mucous.   Medication Administration: Whole meds with liquid  (but discussed Whole in a Puree if easier for large pills)    Other  Recommendations Recommended Consults: Consider ENT evaluation (for sinus drainage complaint) Oral Care Recommendations: Oral care BID;Patient independent with oral care Other Recommendations:  (n/a)   Follow up Recommendations None      Frequency and Duration  (n/a)   (n/a)       Prognosis Prognosis for Safe Diet Advancement: Good Barriers to Reach Goals:  (n/a)      Swallow Study   General Date of Onset: 03/14/21 HPI: Pt is a 75 y.o. female who lives at home w/ Husband (pt is her Husband'd Caregiver) presenting to hospital 4/29 with loss of consciousness and CPR (pt bent over and had LOC falling forward hitting head on ground; family started CPR for about 2-3 minutes d/t pt did not appear to be breathing and unable palpate pulse.  Per MRI of head 5/3: "Patchy small acute cortical infarcts within the left parietal lobe (with involvement of the postcentral gyrus). 11 mm acute infarct  within the medial right thalamus. 14 mm acute infarct within the  medial aspect of the superior left cerebellar hemisphere. Given the  involvement of multiple vascular territories, consider an embolic  process."  Pt admitted with NSTEMI, syncope, paroxysmal a-fib, hypokalemia, essential htn, and HLD.  PMH includes arthritis, htn, HLD.  Pt admitted for Non-ST elevation myocardial infarction; Syncope; Stroke.  CXR: clear. Type of Study: Bedside Swallow Evaluation Previous Swallow Assessment: none Diet Prior to this Study: Regular;Thin liquids Temperature Spikes Noted: No (wbc 8.2) Respiratory Status: Room air History of Recent Intubation: No Behavior/Cognition: Alert;Cooperative;Pleasant mood Oral Cavity Assessment: Within Functional Limits Oral Care Completed by SLP: No (pt eating meal) Oral Cavity - Dentition: Adequate natural dentition Vision: Functional for self-feeding Self-Feeding Abilities: Able to feed self Patient Positioning:  Upright in bed (EOB) Baseline Vocal Quality: Normal Volitional Cough: Strong Volitional Swallow: Able to elicit    Oral/Motor/Sensory Function Overall Oral Motor/Sensory Function: Within functional limits   Ice Chips Ice chips: Not tested   Thin Liquid Thin Liquid: Within functional limits Presentation: Cup;Self Fed (8-10+ sips)    Nectar Thick Nectar Thick Liquid: Not tested   Honey Thick Honey Thick Liquid: Not tested   Puree Puree: Within functional limits Presentation: Self Fed;Spoon (6+ bites)   Solid     Solid: Within functional limits Presentation: Self Fed (muffin bites)        Jerilynn Som, MS, CCC-SLP Speech Language Pathologist Rehab Services [phone] Gwenyth Dingee 03/19/2021,3:31 PM

## 2021-03-21 DIAGNOSIS — I639 Cerebral infarction, unspecified: Secondary | ICD-10-CM | POA: Insufficient documentation

## 2021-03-21 DIAGNOSIS — I252 Old myocardial infarction: Secondary | ICD-10-CM | POA: Insufficient documentation

## 2021-05-02 ENCOUNTER — Encounter: Payer: Medicare PPO | Attending: Cardiology | Admitting: *Deleted

## 2021-05-02 ENCOUNTER — Other Ambulatory Visit: Payer: Self-pay

## 2021-05-02 DIAGNOSIS — I214 Non-ST elevation (NSTEMI) myocardial infarction: Secondary | ICD-10-CM | POA: Insufficient documentation

## 2021-05-02 DIAGNOSIS — Z5189 Encounter for other specified aftercare: Secondary | ICD-10-CM | POA: Insufficient documentation

## 2021-05-02 NOTE — Progress Notes (Signed)
Initial telephone orientation completed. Diagnosis can be found in [facility] 6/2. EP orientation scheduled for Monday 6/27 at 8am.

## 2021-05-12 ENCOUNTER — Encounter: Payer: Medicare PPO | Admitting: *Deleted

## 2021-05-12 ENCOUNTER — Other Ambulatory Visit: Payer: Self-pay

## 2021-05-12 VITALS — Ht 58.4 in | Wt 104.2 lb

## 2021-05-12 DIAGNOSIS — Z5189 Encounter for other specified aftercare: Secondary | ICD-10-CM | POA: Diagnosis not present

## 2021-05-12 DIAGNOSIS — I214 Non-ST elevation (NSTEMI) myocardial infarction: Secondary | ICD-10-CM

## 2021-05-12 NOTE — Patient Instructions (Signed)
Patient Instructions  Patient Details  Name: Victoria [name] MRN: [id number] Date of Birth: 14-Apr-1946 Referring Provider:  Marcina Millard, MD  Below are your personal goals for exercise, nutrition, and risk factors. Our goal is to help you stay on track towards obtaining and maintaining these goals. We will be discussing your progress on these goals with you throughout the program.  Initial Exercise Prescription:  Initial Exercise Prescription - 05/12/21 1000       Date of Initial Exercise RX and Referring Provider   Date 05/12/21    Referring Provider Paraschos, Alexander MD      Treadmill   MPH 2.9    Grade 0.5    Minutes 15    METs 3.42      REL-XR   Level 1    Speed 50    Minutes 15    METs 3      Biostep-RELP   Level 2    SPM 50    Minutes 15    METs 3      Track   Laps 41    Minutes 15    METs 3.22      Prescription Details   Frequency (times per week) 3    Duration Progress to 30 minutes of continuous aerobic without signs/symptoms of physical distress      Intensity   THRR 40-80% of Max Heartrate 97-129    Ratings of Perceived Exertion 11-13    Perceived Dyspnea 0-4      Progression   Progression Continue to progress workloads to maintain intensity without signs/symptoms of physical distress.      Resistance Training   Training Prescription Yes    Weight 3 lb    Reps 10-15             Exercise Goals: Frequency: Be able to perform aerobic exercise two to three times per week in program working toward 2-5 days per week of home exercise.  Intensity: Work with a perceived exertion of 11 (fairly light) - 15 (hard) while following your exercise prescription.  We will make changes to your prescription with you as you progress through the program.   Duration: Be able to do 30 to 45 minutes of continuous aerobic exercise in addition to a 5 minute warm-up and a 5 minute cool-down routine.   Nutrition Goals: Your personal nutrition goals will  be established when you do your nutrition analysis with the dietician.  The following are general nutrition guidelines to follow: Cholesterol < 200mg /day Sodium < 1500mg /day Fiber: Women over 50 yrs - 21 grams per day  Personal Goals:  Personal Goals and Risk Factors at Admission - 05/12/21 1039       Core Components/Risk Factors/Patient Goals on Admission    Weight Management Yes;Weight Maintenance    Intervention Weight Management: Develop a combined nutrition and exercise program designed to reach desired caloric intake, while maintaining appropriate intake of nutrient and fiber, sodium and fats, and appropriate energy expenditure required for the weight goal.;Weight Management: Provide education and appropriate resources to help participant work on and attain dietary goals.    Admit Weight 104 lb 3.2 oz (47.3 kg)    Goal Weight: Short Term 104 lb (47.2 kg)    Goal Weight: Long Term 104 lb (47.2 kg)    Expected Outcomes Short Term: Continue to assess and modify interventions until short term weight is achieved;Long Term: Adherence to nutrition and physical activity/exercise program aimed toward attainment of established weight goal;Weight Maintenance: Understanding  of the daily nutrition guidelines, which includes 25-35% calories from fat, 7% or less cal from saturated fats, less than 200mg  cholesterol, less than 1.5gm of sodium, & 5 or more servings of fruits and vegetables daily    Hypertension Yes    Intervention Provide education on lifestyle modifcations including regular physical activity/exercise, weight management, moderate sodium restriction and increased consumption of fresh fruit, vegetables, and low fat dairy, alcohol moderation, and smoking cessation.;Monitor prescription use compliance.    Expected Outcomes Short Term: Continued assessment and intervention until BP is < 140/29mm HG in hypertensive participants. < 130/38mm HG in hypertensive participants with diabetes, heart  failure or chronic kidney disease.;Long Term: Maintenance of blood pressure at goal levels.    Lipids Yes    Intervention Provide education and support for participant on nutrition & aerobic/resistive exercise along with prescribed medications to achieve LDL 70mg , HDL >40mg .    Expected Outcomes Short Term: Participant states understanding of desired cholesterol values and is compliant with medications prescribed. Participant is following exercise prescription and nutrition guidelines.;Long Term: Cholesterol controlled with medications as prescribed, with individualized exercise RX and with personalized nutrition plan. Value goals: LDL < 70mg , HDL > 40 mg.             Tobacco Use Initial Evaluation: Social History   Tobacco Use  Smoking Status Never  Smokeless Tobacco Never    Exercise Goals and Review:  Exercise Goals     Row Name 05/12/21 1037             Exercise Goals   Increase Physical Activity Yes       Intervention Provide advice, education, support and counseling about physical activity/exercise needs.;Develop an individualized exercise prescription for aerobic and resistive training based on initial evaluation findings, risk stratification, comorbidities and participant's personal goals.       Expected Outcomes Short Term: Attend rehab on a regular basis to increase amount of physical activity.;Long Term: Add in home exercise to make exercise part of routine and to increase amount of physical activity.;Long Term: Exercising regularly at least 3-5 days a week.       Increase Strength and Stamina Yes       Intervention Provide advice, education, support and counseling about physical activity/exercise needs.;Develop an individualized exercise prescription for aerobic and resistive training based on initial evaluation findings, risk stratification, comorbidities and participant's personal goals.       Expected Outcomes Short Term: Increase workloads from initial exercise  prescription for resistance, speed, and METs.;Short Term: Perform resistance training exercises routinely during rehab and add in resistance training at home;Long Term: Improve cardiorespiratory fitness, muscular endurance and strength as measured by increased METs and functional capacity ( )       Able to understand and use rate of perceived exertion (RPE) scale Yes       Intervention Provide education and explanation on how to use RPE scale       Expected Outcomes Short Term: Able to use RPE daily in rehab to express subjective intensity level;Long Term:  Able to use RPE to guide intensity level when exercising independently       Able to understand and use Dyspnea scale Yes       Intervention Provide education and explanation on how to use Dyspnea scale       Expected Outcomes Short Term: Able to use Dyspnea scale daily in rehab to express subjective sense of shortness of breath during exertion;Long Term: Able to use Dyspnea scale to guide  intensity level when exercising independently       Knowledge and understanding of Target Heart Rate Range (THRR) Yes       Intervention Provide education and explanation of THRR including how the numbers were predicted and where they are located for reference       Expected Outcomes Short Term: Able to state/look up THRR;Short Term: Able to use daily as guideline for intensity in rehab;Long Term: Able to use THRR to govern intensity when exercising independently       Able to check pulse independently Yes       Intervention Provide education and demonstration on how to check pulse in carotid and radial arteries.;Review the importance of being able to check your own pulse for safety during independent exercise       Expected Outcomes Short Term: Able to explain why pulse checking is important during independent exercise;Long Term: Able to check pulse independently and accurately       Understanding of Exercise Prescription Yes       Intervention Provide  education, explanation, and written materials on patient's individual exercise prescription       Expected Outcomes Short Term: Able to explain program exercise prescription;Long Term: Able to explain home exercise prescription to exercise independently                Copy of goals given to participant.

## 2021-05-12 NOTE — Progress Notes (Signed)
Cardiac Individual Treatment Plan  Patient Details  Name: Victoria [name] MRN: [id number] Date of Birth: 09/29/1946 Referring Provider:   Flowsheet Row Cardiac Rehab from 05/12/2021 in [facility] Cardiac and Pulmonary Rehab  Referring Provider Marcina Millard MD       Initial Encounter Date:  Flowsheet Row Cardiac Rehab from 05/12/2021 in [facility] Cardiac and Pulmonary Rehab  Date 05/12/21       Visit Diagnosis: NSTEMI (non-ST elevated myocardial infarction) [facility])  Patient's Home Medications on Admission:  Current Outpatient Medications:    apixaban (ELIQUIS) 5 MG TABS tablet, Take 1 tablet (5 mg total) by mouth 2 (two) times daily., Disp: 60 tablet, Rfl: 2   atorvastatin (LIPITOR) 80 MG tablet, Take 1 tablet (80 mg total) by mouth daily., Disp: 30 tablet, Rfl: 1   b complex vitamins capsule, Take 1 capsule by mouth daily. (Patient not taking: No sig reported), Disp: , Rfl:    Cholecalciferol 25 MCG (1000 UT) tablet, Take 1 tablet by mouth daily., Disp: , Rfl:    clopidogrel (PLAVIX) 75 MG tablet, Take 1 tablet (75 mg total) by mouth daily with breakfast., Disp: 30 tablet, Rfl: 1   losartan (COZAAR) 50 MG tablet, Take 1 tablet (50 mg total) by mouth daily., Disp: 30 tablet, Rfl: 1   metoprolol tartrate (LOPRESSOR) 25 MG tablet, Take 1 tablet (25 mg total) by mouth 2 (two) times daily., Disp: 60 tablet, Rfl: 1   Multiple Vitamin (MULTI-VITAMIN) tablet, Take 1 tablet by mouth daily., Disp: , Rfl:   Past Medical History: Past Medical History:  Diagnosis Date   Arthritis    Headache    Hyperlipidemia    Hypertension     Tobacco Use: Social History   Tobacco Use  Smoking Status Never  Smokeless Tobacco Never    Labs: Recent Review Flowsheet Data     Labs for ITP Cardiac and Pulmonary Rehab Latest Ref Rng & Units 03/19/2021   Cholestrol 0 - 200 mg/dL 469   LDLCALC 0 - 99 mg/dL 82   HDL >62 mg/dL 44   Trlycerides <952 mg/dL 96   Hemoglobin W4X 4.8 - 5.6 % 5.7(H)         Exercise Target Goals: Exercise Program Goal: Individual exercise prescription set using results from initial 6 min walk test and THRR while considering  patient's activity barriers and safety.   Exercise Prescription Goal: Initial exercise prescription builds to 30-45 minutes a day of aerobic activity, 2-3 days per week.  Home exercise guidelines will be given to patient during program as part of exercise prescription that the participant will acknowledge.   Education: Aerobic Exercise: - Group verbal and visual presentation on the components of exercise prescription. Introduces F.I.T.T principle from ACSM for exercise prescriptions.  Reviews F.I.T.T. principles of aerobic exercise including progression. Written material given at graduation.   Education: Resistance Exercise: - Group verbal and visual presentation on the components of exercise prescription. Introduces F.I.T.T principle from ACSM for exercise prescriptions  Reviews F.I.T.T. principles of resistance exercise including progression. Written material given at graduation.    Education: Exercise & Equipment Safety: - Individual verbal instruction and demonstration of equipment use and safety with use of the equipment. Flowsheet Row Cardiac Rehab from 05/12/2021 in [facility] Cardiac and Pulmonary Rehab  Date 05/12/21  Educator [facility]  Instruction Review Code 1- Verbalizes Understanding       Education: Exercise Physiology & General Exercise Guidelines: - Group verbal and written instruction with models to review the exercise physiology of  the cardiovascular system and associated critical values. Provides general exercise guidelines with specific guidelines to those with heart or lung disease.    Education: Flexibility, Balance, Mind/Body Relaxation: - Group verbal and visual presentation with interactive activity on the components of exercise prescription. Introduces F.I.T.T principle from ACSM for exercise prescriptions.  Reviews F.I.T.T. principles of flexibility and balance exercise training including progression. Also discusses the mind body connection.  Reviews various relaxation techniques to help reduce and manage stress (i.e. Deep breathing, progressive muscle relaxation, and visualization). Balance handout provided to take home. Written material given at graduation.   Activity Barriers & Risk Stratification:  Activity Barriers & Cardiac Risk Stratification - 05/12/21 1034       Activity Barriers & Cardiac Risk Stratification   Activity Barriers None    Cardiac Risk Stratification High             6 Minute Walk:  6 Minute Walk     Row Name 05/12/21 1030         6 Minute Walk   Phase Initial     Distance 1555 feet     Walk Time 6 minutes     # of Rest Breaks 0     MPH 2.95     METS 3.26     RPE 9     VO2 Peak 11.39     Symptoms No     Resting HR 65 bpm     Resting BP 134/62     Resting Oxygen Saturation  98 %     Exercise Oxygen Saturation  during 6 min walk 97 %     Max Ex. HR 99 bpm     Max Ex. BP 146/74     2 Minute Post BP 126/62              Oxygen Initial Assessment:   Oxygen Re-Evaluation:   Oxygen Discharge (Final Oxygen Re-Evaluation):   Initial Exercise Prescription:  Initial Exercise Prescription - 05/12/21 1000       Date of Initial Exercise RX and Referring Provider   Date 05/12/21    Referring Provider Paraschos, Alexander MD      Treadmill   MPH 2.9    Grade 0.5    Minutes 15    METs 3.42      REL-XR   Level 1    Speed 50    Minutes 15    METs 3      Biostep-RELP   Level 2    SPM 50    Minutes 15    METs 3      Track   Laps 41    Minutes 15    METs 3.22      Prescription Details   Frequency (times per week) 3    Duration Progress to 30 minutes of continuous aerobic without signs/symptoms of physical distress      Intensity   THRR 40-80% of Max Heartrate 97-129    Ratings of Perceived Exertion 11-13    Perceived Dyspnea  0-4      Progression   Progression Continue to progress workloads to maintain intensity without signs/symptoms of physical distress.      Resistance Training   Training Prescription Yes    Weight 3 lb    Reps 10-15             Perform Capillary Blood Glucose checks as needed.  Exercise Prescription Changes:   Exercise Prescription Changes     Row Name  05/12/21 1000             Response to Exercise   Blood Pressure (Admit) 134/62       Blood Pressure (Exercise) 146/74       Blood Pressure (Exit) 126/62       Heart Rate (Admit) 65 bpm       Heart Rate (Exercise) 99 bpm       Heart Rate (Exit) 67 bpm       Oxygen Saturation (Admit) 98 %       Oxygen Saturation (Exercise) 97 %       Rating of Perceived Exertion (Exercise) 9       Symptoms none       Comments walk test results                Exercise Comments:   Exercise Goals and Review:   Exercise Goals     Row Name 05/12/21 1037             Exercise Goals   Increase Physical Activity Yes       Intervention Provide advice, education, support and counseling about physical activity/exercise needs.;Develop an individualized exercise prescription for aerobic and resistive training based on initial evaluation findings, risk stratification, comorbidities and participant's personal goals.       Expected Outcomes Short Term: Attend rehab on a regular basis to increase amount of physical activity.;Long Term: Add in home exercise to make exercise part of routine and to increase amount of physical activity.;Long Term: Exercising regularly at least 3-5 days a week.       Increase Strength and Stamina Yes       Intervention Provide advice, education, support and counseling about physical activity/exercise needs.;Develop an individualized exercise prescription for aerobic and resistive training based on initial evaluation findings, risk stratification, comorbidities and participant's personal goals.       Expected  Outcomes Short Term: Increase workloads from initial exercise prescription for resistance, speed, and METs.;Short Term: Perform resistance training exercises routinely during rehab and add in resistance training at home;Long Term: Improve cardiorespiratory fitness, muscular endurance and strength as measured by increased METs and functional capacity ( )       Able to understand and use rate of perceived exertion (RPE) scale Yes       Intervention Provide education and explanation on how to use RPE scale       Expected Outcomes Short Term: Able to use RPE daily in rehab to express subjective intensity level;Long Term:  Able to use RPE to guide intensity level when exercising independently       Able to understand and use Dyspnea scale Yes       Intervention Provide education and explanation on how to use Dyspnea scale       Expected Outcomes Short Term: Able to use Dyspnea scale daily in rehab to express subjective sense of shortness of breath during exertion;Long Term: Able to use Dyspnea scale to guide intensity level when exercising independently       Knowledge and understanding of Target Heart Rate Range (THRR) Yes       Intervention Provide education and explanation of THRR including how the numbers were predicted and where they are located for reference       Expected Outcomes Short Term: Able to state/look up THRR;Short Term: Able to use daily as guideline for intensity in rehab;Long Term: Able to use THRR to govern intensity when exercising independently  Able to check pulse independently Yes       Intervention Provide education and demonstration on how to check pulse in carotid and radial arteries.;Review the importance of being able to check your own pulse for safety during independent exercise       Expected Outcomes Short Term: Able to explain why pulse checking is important during independent exercise;Long Term: Able to check pulse independently and accurately       Understanding of  Exercise Prescription Yes       Intervention Provide education, explanation, and written materials on patient's individual exercise prescription       Expected Outcomes Short Term: Able to explain program exercise prescription;Long Term: Able to explain home exercise prescription to exercise independently                Exercise Goals Re-Evaluation :   Discharge Exercise Prescription (Final Exercise Prescription Changes):  Exercise Prescription Changes - 05/12/21 1000       Response to Exercise   Blood Pressure (Admit) 134/62    Blood Pressure (Exercise) 146/74    Blood Pressure (Exit) 126/62    Heart Rate (Admit) 65 bpm    Heart Rate (Exercise) 99 bpm    Heart Rate (Exit) 67 bpm    Oxygen Saturation (Admit) 98 %    Oxygen Saturation (Exercise) 97 %    Rating of Perceived Exertion (Exercise) 9    Symptoms none    Comments walk test results             Nutrition:  Target Goals: Understanding of nutrition guidelines, daily intake of sodium 1500mg , cholesterol 200mg , calories 30% from fat and 7% or less from saturated fats, daily to have 5 or more servings of fruits and vegetables.  Education: All About Nutrition: -Group instruction provided by verbal, written material, interactive activities, discussions, models, and posters to present general guidelines for heart healthy nutrition including fat, fiber, MyPlate, the role of sodium in heart healthy nutrition, utilization of the nutrition label, and utilization of this knowledge for meal planning. Follow up email sent as well. Written material given at graduation.   Biometrics:  Pre Biometrics - 05/12/21 1037       Pre Biometrics   Height 4' 10.4" (1.483 m)    Weight 104 lb 3.2 oz (47.3 kg)    BMI (Calculated) 21.49    Single Leg Stand 30 seconds              Nutrition Therapy Plan and Nutrition Goals:  Nutrition Therapy & Goals - 05/12/21 1038       Intervention Plan   Intervention Prescribe, educate  and counsel regarding individualized specific dietary modifications aiming towards targeted core components such as weight, hypertension, lipid management, diabetes, heart failure and other comorbidities.    Expected Outcomes Short Term Goal: Understand basic principles of dietary content, such as calories, fat, sodium, cholesterol and nutrients.;Short Term Goal: A plan has been developed with personal nutrition goals set during dietitian appointment.;Long Term Goal: Adherence to prescribed nutrition plan.             Nutrition Assessments:  MEDIFICTS Score Key: ?70 Need to make dietary changes  40-70 Heart Healthy Diet ? 40 Therapeutic Level Cholesterol Diet  Flowsheet Row Cardiac Rehab from 05/12/2021 in [facility] Cardiac and Pulmonary Rehab  Picture Your Plate Total Score on Admission 81      Picture Your Plate Scores: <53 Unhealthy dietary pattern with much room for improvement. 41-50 Dietary pattern unlikely to  meet recommendations for good health and room for improvement. 51-60 More healthful dietary pattern, with some room for improvement.  >60 Healthy dietary pattern, although there may be some specific behaviors that could be improved.    Nutrition Goals Re-Evaluation:   Nutrition Goals Discharge (Final Nutrition Goals Re-Evaluation):   Psychosocial: Target Goals: Acknowledge presence or absence of significant depression and/or stress, maximize coping skills, provide positive support system. Participant is able to verbalize types and ability to use techniques and skills needed for reducing stress and depression.   Education: Stress, Anxiety, and Depression - Group verbal and visual presentation to define topics covered.  Reviews how body is impacted by stress, anxiety, and depression.  Also discusses healthy ways to reduce stress and to treat/manage anxiety and depression.  Written material given at graduation.   Education: Sleep Hygiene -Provides group verbal and written  instruction about how sleep can affect your health.  Define sleep hygiene, discuss sleep cycles and impact of sleep habits. Review good sleep hygiene tips.    Initial Review & Psychosocial Screening:  Initial Psych Review & Screening - 05/02/21 1409       Initial Review   Current issues with None Identified      Family Dynamics   Good Support System? Yes      Barriers   Psychosocial barriers to participate in program There are no identifiable barriers or psychosocial needs.;The patient should benefit from training in stress management and relaxation.      Screening Interventions   Interventions Provide feedback about the scores to participant;Encouraged to exercise;To provide support and resources with identified psychosocial needs    Expected Outcomes Short Term goal: Utilizing psychosocial counselor, staff and physician to assist with identification of specific Stressors or current issues interfering with healing process. Setting desired goal for each stressor or current issue identified.;Long Term Goal: Stressors or current issues are controlled or eliminated.;Short Term goal: Identification and review with participant of any Quality of Life or Depression concerns found by scoring the questionnaire.;Long Term goal: The participant improves quality of Life and PHQ9 Scores as seen by post scores and/or verbalization of changes             Quality of Life Scores:   Quality of Life - 05/12/21 1038       Quality of Life   Select Quality of Life      Quality of Life Scores   Health/Function Pre 26.7 %    Socioeconomic Pre 25.36 %    Psych/Spiritual Pre 24.93 %    Family Pre 16.4 %    GLOBAL Pre 24.54 %            Scores of 19 and below usually indicate a poorer quality of life in these areas.  A difference of  2-3 points is a clinically meaningful difference.  A difference of 2-3 points in the total score of the Quality of Life Index has been associated with significant  improvement in overall quality of life, self-image, physical symptoms, and general health in studies assessing change in quality of life.  PHQ-9: Recent Review Flowsheet Data     Depression screen [facility] 2/9 05/12/2021   Decreased Interest 0   Down, Depressed, Hopeless 0   PHQ - 2 Score 0   Altered sleeping 0   Tired, decreased energy 0   Change in appetite 0   Feeling bad or failure about yourself  0   Trouble concentrating 0   Moving slowly or fidgety/restless 0  Suicidal thoughts 0   PHQ-9 Score 0   Difficult doing work/chores Not difficult at all      Interpretation of Total Score  Total Score Depression Severity:  1-4 = Minimal depression, 5-9 = Mild depression, 10-14 = Moderate depression, 15-19 = Moderately severe depression, 20-27 = Severe depression   Psychosocial Evaluation and Intervention:  Psychosocial Evaluation - 05/02/21 1430       Psychosocial Evaluation & Interventions   Comments Ms. Hinchey reports doing well post NSTEMI. She nevery experienced any pain and is glad her heart had naturally compensated for the lack of blood flow by building collaterals. 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 She is happy to report that she doesn't notice any deficits from the stroke. Prior to covid [facility] [employer] and is looking at getting back to an active lifestyle. She is very involved in her family's lives and enjoys her family time.    Expected Outcomes Short: attend cardiac rehab for education and exercise. Long: develop and maintain positive self care habits.    Continue Psychosocial Services  Follow up required by staff             Psychosocial Re-Evaluation:   Psychosocial Discharge (Final Psychosocial Re-Evaluation):   Vocational Rehabilitation: Provide vocational rehab assistance to qualifying candidates.   Vocational Rehab Evaluation & Intervention:  Vocational Rehab - 05/02/21 1409        Initial Vocational Rehab Evaluation & Intervention   Assessment shows need for Vocational Rehabilitation No             Education: Education Goals: Education classes will be provided on a variety of topics geared toward better understanding of heart health and risk factor modification. Participant will state understanding/return demonstration of topics presented as noted by education test scores.  Learning Barriers/Preferences:  Learning Barriers/Preferences - 05/02/21 1409       Learning Barriers/Preferences   Learning Barriers None    Learning Preferences None             General Cardiac Education Topics:  AED/CPR: - Group verbal and written instruction with the use of models to demonstrate the basic use of the AED with the basic ABC's of resuscitation.   Anatomy and Cardiac Procedures: - Group verbal and visual presentation and models provide information about basic cardiac anatomy and function. Reviews the testing methods done to diagnose heart disease and the outcomes of the test results. Describes the treatment choices: Medical Management, Angioplasty, or Coronary Bypass Surgery for treating various heart conditions including Myocardial Infarction, Angina, Valve Disease, and Cardiac Arrhythmias.  Written material given at graduation.   Medication Safety: - Group verbal and visual instruction to review commonly prescribed medications for heart and lung disease. Reviews the medication, class of the drug, and side effects. Includes the steps to properly store meds and maintain the prescription regimen.  Written material given at graduation.   Intimacy: - Group verbal instruction through game format to discuss how heart and lung disease can affect sexual intimacy. Written material given at graduation..   Know Your Numbers and Heart Failure: - Group verbal and visual instruction to discuss disease risk factors for cardiac and pulmonary disease and treatment options.   Reviews associated critical values for Overweight/Obesity, Hypertension, Cholesterol, and Diabetes.  Discusses basics of heart failure: signs/symptoms and treatments.  Introduces Heart Failure Zone chart for action plan for heart failure.  Written material given  at graduation.   Infection Prevention: - Provides verbal and written material to individual with discussion of infection control including proper hand washing and proper equipment cleaning during exercise session. Flowsheet Row Cardiac Rehab from 05/12/2021 in [facility] Cardiac and Pulmonary Rehab  Date 05/12/21  Educator [facility]  Instruction Review Code 1- Verbalizes Understanding       Falls Prevention: - Provides verbal and written material to individual with discussion of falls prevention and safety. Flowsheet Row Cardiac Rehab from 05/12/2021 in [facility] Cardiac and Pulmonary Rehab  Date 05/12/21  Educator [facility]  Instruction Review Code 1- Verbalizes Understanding       Other: -Provides group and verbal instruction on various topics (see comments)   Knowledge Questionnaire Score:  Knowledge Questionnaire Score - 05/12/21 1038       Knowledge Questionnaire Score   Pre Score 26/26             Core Components/Risk Factors/Patient Goals at Admission:  Personal Goals and Risk Factors at Admission - 05/12/21 1039       Core Components/Risk Factors/Patient Goals on Admission    Weight Management Yes;Weight Maintenance    Intervention Weight Management: Develop a combined nutrition and exercise program designed to reach desired caloric intake, while maintaining appropriate intake of nutrient and fiber, sodium and fats, and appropriate energy expenditure required for the weight goal.;Weight Management: Provide education and appropriate resources to help participant work on and attain dietary goals.    Admit Weight 104 lb 3.2 oz (47.3 kg)    Goal Weight: Short Term 104 lb (47.2 kg)    Goal Weight: Long Term 104 lb (47.2 kg)     Expected Outcomes Short Term: Continue to assess and modify interventions until short term weight is achieved;Long Term: Adherence to nutrition and physical activity/exercise program aimed toward attainment of established weight goal;Weight Maintenance: Understanding of the daily nutrition guidelines, which includes 25-35% calories from fat, 7% or less cal from saturated fats, less than  cholesterol, less than 1.5gm of sodium, & 5 or more servings of fruits and vegetables daily    Hypertension Yes    Intervention Provide education on lifestyle modifcations including regular physical activity/exercise, weight management, moderate sodium restriction and increased consumption of fresh fruit, vegetables, and low fat dairy, alcohol moderation, and smoking cessation.;Monitor prescription use compliance.    Expected Outcomes Short Term: Continued assessment and intervention until BP is < 140/50mm HG in hypertensive participants. < 130/49mm HG in hypertensive participants with diabetes, heart failure or chronic kidney disease.;Long Term: Maintenance of blood pressure at goal levels.    Lipids Yes    Intervention Provide education and support for participant on nutrition & aerobic/resistive exercise along with prescribed medications to achieve LDL 70mg , HDL >40mg .    Expected Outcomes Short Term: Participant states understanding of desired cholesterol values and is compliant with medications prescribed. Participant is following exercise prescription and nutrition guidelines.;Long Term: Cholesterol controlled with medications as prescribed, with individualized exercise RX and with personalized nutrition plan. Value goals: LDL < , HDL > 40 mg.             Education:Diabetes - Individual verbal and written instruction to review signs/symptoms of diabetes, desired ranges of glucose level fasting, after meals and with exercise. Acknowledge that pre and post exercise glucose checks will be done for 3  sessions at entry of program.   Core Components/Risk Factors/Patient Goals Review:    Core Components/Risk Factors/Patient Goals at Discharge (Final Review):    ITP Comments:  ITP Comments     Row Name 05/02/21 1414 05/12/21 1029         ITP Comments Initial telephone orientation completed. Diagnosis can be found in [facility] 6/2. EP orientation scheduled for Monday 6/27 at 8am. Completed and gym orientation. Initial ITP created and sent for review to Dr. Bethann Punches, Medical Director.               Comments: Initial ITP

## 2021-05-14 ENCOUNTER — Other Ambulatory Visit: Payer: Self-pay

## 2021-05-14 DIAGNOSIS — I214 Non-ST elevation (NSTEMI) myocardial infarction: Secondary | ICD-10-CM

## 2021-05-14 NOTE — Progress Notes (Signed)
Daily Session Note  Patient Details  Name: LADELLE TEODORO MRN: [id number] Date of Birth: 1946-04-21 Referring Provider:   Flowsheet Row Cardiac Rehab from 05/12/2021 in [facility] Cardiac and Pulmonary Rehab  Referring Provider Isaias Cowman MD       Encounter Date: 05/14/2021  Check In:  Session Check In - 05/14/21 1135       Check-In   Supervising physician immediately available to respond to emergencies See telemetry face sheet for immediately available ER MD    Staff Present Birdie Sons, MPA, RN;Melissa Rivergrove, RDN, LDN;Drew Herman, RN,BC,MSN;Joseph [place], [place]    Virtual Visit No    Medication changes reported     No    Fall or balance concerns reported    No    Warm-up and Cool-down Performed on first and last piece of equipment    Resistance Training Performed Yes    VAD Patient? No    PAD/SET Patient? No      Pain Assessment   Currently in Pain? No/denies                Social History   Tobacco Use  Smoking Status Never  Smokeless Tobacco Never    Goals Met:  Independence with exercise equipment Exercise tolerated well No report of cardiac concerns or symptoms Strength training completed today  Goals Unmet:  Not Applicable  Comments: Pt able to follow exercise prescription today without complaint.  Will continue to monitor for progression.    Dr. Emily Filbert is Medical Director for [place].  Dr. Ottie Glazier is Medical Director for [facility] Pulmonary Rehabilitation.

## 2021-05-16 ENCOUNTER — Other Ambulatory Visit: Payer: Self-pay

## 2021-05-16 ENCOUNTER — Encounter: Payer: Medicare PPO | Attending: Cardiology | Admitting: *Deleted

## 2021-05-16 DIAGNOSIS — Z5189 Encounter for other specified aftercare: Secondary | ICD-10-CM | POA: Diagnosis not present

## 2021-05-16 DIAGNOSIS — I252 Old myocardial infarction: Secondary | ICD-10-CM | POA: Diagnosis not present

## 2021-05-16 DIAGNOSIS — I214 Non-ST elevation (NSTEMI) myocardial infarction: Secondary | ICD-10-CM

## 2021-05-16 NOTE — Progress Notes (Signed)
Daily Session Note  Patient Details  Name: Victoria [name] MRN: [id number] Date of Birth: 1946-07-15 Referring Provider:   Flowsheet Row Cardiac Rehab from 05/12/2021 in [facility] Cardiac and Pulmonary Rehab  Referring Provider Isaias Cowman MD       Encounter Date: 05/16/2021  Check In:  Session Check In - 05/16/21 1112       Check-In   Supervising physician immediately available to respond to emergencies See telemetry face sheet for immediately available ER MD    Location [facility]-Cardiac & Pulmonary Rehab    Staff Present Renita Papa, RN BSN;Jessica Goulds, MA, RCEP, CCRP, CCET;Joseph [place], [place]    Virtual Visit No    Medication changes reported     No    Fall or balance concerns reported    No    Warm-up and Cool-down Performed on first and last piece of equipment    Resistance Training Performed Yes    VAD Patient? No    PAD/SET Patient? No      Pain Assessment   Currently in Pain? No/denies                Social History   Tobacco Use  Smoking Status Never  Smokeless Tobacco Never    Goals Met:  Independence with exercise equipment Exercise tolerated well No report of cardiac concerns or symptoms Strength training completed today  Goals Unmet:  Not Applicable  Comments: Pt able to follow exercise prescription today without complaint.  Will continue to monitor for progression.    Dr. Emily Filbert is Medical Director for [place].  Dr. Ottie Glazier is Medical Director for [facility] Pulmonary Rehabilitation.

## 2021-05-21 ENCOUNTER — Other Ambulatory Visit: Payer: Self-pay

## 2021-05-21 DIAGNOSIS — Z5189 Encounter for other specified aftercare: Secondary | ICD-10-CM | POA: Diagnosis not present

## 2021-05-21 DIAGNOSIS — I214 Non-ST elevation (NSTEMI) myocardial infarction: Secondary | ICD-10-CM

## 2021-05-21 NOTE — Progress Notes (Signed)
Daily Session Note  Patient Details  Name: Victoria [name] MRN: [id number] Date of Birth: 04/19/1946 Referring Provider:   Flowsheet Row Cardiac Rehab from 05/12/2021 in [facility] Cardiac and Pulmonary Rehab  Referring Provider Isaias Cowman MD       Encounter Date: 05/21/2021  Check In:  Session Check In - 05/21/21 1032       Check-In   Supervising physician immediately available to respond to emergencies See telemetry face sheet for immediately available ER MD    Location [facility]-Cardiac & Pulmonary Rehab    Staff Present Birdie Sons, MPA, RN;Laureen Owens Shark, BS, RRT, CPFT;Joseph [place], [place]    Virtual Visit No    Medication changes reported     No    Fall or balance concerns reported    No    Warm-up and Cool-down Performed on first and last piece of equipment    Resistance Training Performed Yes    VAD Patient? No    PAD/SET Patient? No      Pain Assessment   Currently in Pain? No/denies                Social History   Tobacco Use  Smoking Status Never  Smokeless Tobacco Never    Goals Met:  Independence with exercise equipment Exercise tolerated well No report of cardiac concerns or symptoms Strength training completed today  Goals Unmet:  Not Applicable  Comments: Pt able to follow exercise prescription today without complaint.  Will continue to monitor for progression.    Dr. Emily Filbert is Medical Director for [place].  Dr. Ottie Glazier is Medical Director for [facility] Pulmonary Rehabilitation.

## 2021-05-23 ENCOUNTER — Other Ambulatory Visit: Payer: Self-pay

## 2021-05-23 ENCOUNTER — Encounter: Payer: Medicare PPO | Admitting: *Deleted

## 2021-05-23 DIAGNOSIS — I214 Non-ST elevation (NSTEMI) myocardial infarction: Secondary | ICD-10-CM

## 2021-05-23 DIAGNOSIS — Z5189 Encounter for other specified aftercare: Secondary | ICD-10-CM | POA: Diagnosis not present

## 2021-05-23 NOTE — Progress Notes (Signed)
Daily Session Note  Patient Details  Name: Victoria [name] MRN: [id number] Date of Birth: Dec 22, 1945 Referring Provider:   Flowsheet Row Cardiac Rehab from 05/12/2021 in [facility] Cardiac and Pulmonary Rehab  Referring Provider Isaias Cowman MD       Encounter Date: 05/23/2021  Check In:  Session Check In - 05/23/21 1100       Check-In   Supervising physician immediately available to respond to emergencies See telemetry face sheet for immediately available ER MD    Location [facility]-Cardiac & Pulmonary Rehab    Staff Present Renita Papa, RN BSN;Jessica Luan Pulling, MA, RCEP, CCRP, CCET;Melissa Burr, RDN, LDN    Virtual Visit No    Medication changes reported     No    Fall or balance concerns reported    No    Warm-up and Cool-down Performed on first and last piece of equipment    Resistance Training Performed Yes    VAD Patient? No    PAD/SET Patient? No      Pain Assessment   Currently in Pain? No/denies                Social History   Tobacco Use  Smoking Status Never  Smokeless Tobacco Never    Goals Met:  Independence with exercise equipment Exercise tolerated well No report of cardiac concerns or symptoms Strength training completed today  Goals Unmet:  Not Applicable  Comments: Pt able to follow exercise prescription today without complaint.  Will continue to monitor for progression.    Dr. Emily Filbert is Medical Director for [place].  Dr. Ottie Glazier is Medical Director for [facility] Pulmonary Rehabilitation.

## 2021-05-28 ENCOUNTER — Other Ambulatory Visit: Payer: Self-pay

## 2021-05-28 ENCOUNTER — Encounter: Payer: Self-pay | Admitting: *Deleted

## 2021-05-28 ENCOUNTER — Encounter: Payer: Medicare PPO | Admitting: *Deleted

## 2021-05-28 DIAGNOSIS — I214 Non-ST elevation (NSTEMI) myocardial infarction: Secondary | ICD-10-CM

## 2021-05-28 DIAGNOSIS — Z5189 Encounter for other specified aftercare: Secondary | ICD-10-CM | POA: Diagnosis not present

## 2021-05-28 NOTE — Progress Notes (Signed)
Daily Session Note  Patient Details  Name: Victoria [name] MRN: [id number] Date of Birth: May 22, 1946 Referring Provider:   Flowsheet Row Cardiac Rehab from 05/12/2021 in [facility] Cardiac and Pulmonary Rehab  Referring Provider Isaias Cowman MD       Encounter Date: 05/28/2021  Check In:  Session Check In - 05/28/21 1113       Check-In   Supervising physician immediately available to respond to emergencies See telemetry face sheet for immediately available ER MD    Location [facility]-Cardiac & Pulmonary Rehab    Staff Present Renita Papa, RN Margurite Auerbach, MS, ASCM CEP, Exercise Physiologist;Kelly Rosalia Hammers, MPA, RN    Virtual Visit No    Medication changes reported     No    Fall or balance concerns reported    No    Warm-up and Cool-down Performed on first and last piece of equipment    Resistance Training Performed Yes    VAD Patient? No    PAD/SET Patient? No      Pain Assessment   Currently in Pain? No/denies                Social History   Tobacco Use  Smoking Status Never  Smokeless Tobacco Never    Goals Met:  Independence with exercise equipment Exercise tolerated well No report of cardiac concerns or symptoms Strength training completed today  Goals Unmet:  Not Applicable  Comments: Pt able to follow exercise prescription today without complaint.  Will continue to monitor for progression.    Dr. Emily Filbert is Medical Director for [place].  Dr. Ottie Glazier is Medical Director for [facility] Pulmonary Rehabilitation.

## 2021-05-28 NOTE — Progress Notes (Signed)
Cardiac Individual Treatment Plan  Patient Details  Name: Victoria [name] MRN: [id number] Date of Birth: 1946/02/10 Referring Provider:   Flowsheet Row Cardiac Rehab from 05/12/2021 in [facility] Cardiac and Pulmonary Rehab  Referring Provider Marcina Millard MD       Initial Encounter Date:  Flowsheet Row Cardiac Rehab from 05/12/2021 in [facility] Cardiac and Pulmonary Rehab  Date 05/12/21       Visit Diagnosis: NSTEMI (non-ST elevated myocardial infarction) [facility])  Patient's Home Medications on Admission:  Current Outpatient Medications:    apixaban (ELIQUIS) 5 MG TABS tablet, Take 1 tablet (5 mg total) by mouth 2 (two) times daily., Disp: 60 tablet, Rfl: 2   atorvastatin (LIPITOR) 80 MG tablet, Take 1 tablet (80 mg total) by mouth daily., Disp: 30 tablet, Rfl: 1   b complex vitamins capsule, Take 1 capsule by mouth daily. (Patient not taking: No sig reported), Disp: , Rfl:    Cholecalciferol 25 MCG (1000 UT) tablet, Take 1 tablet by mouth daily., Disp: , Rfl:    clopidogrel (PLAVIX) 75 MG tablet, Take 1 tablet (75 mg total) by mouth daily with breakfast., Disp: 30 tablet, Rfl: 1   losartan (COZAAR) 50 MG tablet, Take 1 tablet (50 mg total) by mouth daily., Disp: 30 tablet, Rfl: 1   metoprolol tartrate (LOPRESSOR) 25 MG tablet, Take 1 tablet (25 mg total) by mouth 2 (two) times daily., Disp: 60 tablet, Rfl: 1   Multiple Vitamin (MULTI-VITAMIN) tablet, Take 1 tablet by mouth daily., Disp: , Rfl:   Past Medical History: Past Medical History:  Diagnosis Date   Arthritis    Headache    Hyperlipidemia    Hypertension     Tobacco Use: Social History   Tobacco Use  Smoking Status Never  Smokeless Tobacco Never    Labs: Recent Review Flowsheet Data     Labs for ITP Cardiac and Pulmonary Rehab Latest Ref Rng & Units 03/19/2021   Cholestrol 0 - 200 mg/dL 409   LDLCALC 0 - 99 mg/dL 82   HDL >81 mg/dL 44   Trlycerides <191 mg/dL 96   Hemoglobin Y7W 4.8 - 5.6 % 5.7(H)         Exercise Target Goals: Exercise Program Goal: Individual exercise prescription set using results from initial 6 min walk test and THRR while considering  patient's activity barriers and safety.   Exercise Prescription Goal: Initial exercise prescription builds to 30-45 minutes a day of aerobic activity, 2-3 days per week.  Home exercise guidelines will be given to patient during program as part of exercise prescription that the participant will acknowledge.   Education: Aerobic Exercise: - Group verbal and visual presentation on the components of exercise prescription. Introduces F.I.T.T principle from ACSM for exercise prescriptions.  Reviews F.I.T.T. principles of aerobic exercise including progression. Written material given at graduation.   Education: Resistance Exercise: - Group verbal and visual presentation on the components of exercise prescription. Introduces F.I.T.T principle from ACSM for exercise prescriptions  Reviews F.I.T.T. principles of resistance exercise including progression. Written material given at graduation. Flowsheet Row Cardiac Rehab from 05/28/2021 in [facility] Cardiac and Pulmonary Rehab  Date 05/14/21  Educator [facility]  Instruction Review Code 1- Verbalizes Understanding        Education: Exercise & Equipment Safety: - Individual verbal instruction and demonstration of equipment use and safety with use of the equipment. Flowsheet Row Cardiac Rehab from 05/28/2021 in [facility] Cardiac and Pulmonary Rehab  Date 05/12/21  Educator [facility]  Instruction Review Code 1-  Verbalizes Understanding       Education: Exercise Physiology & General Exercise Guidelines: - Group verbal and written instruction with models to review the exercise physiology of the cardiovascular system and associated critical values. Provides general exercise guidelines with specific guidelines to those with heart or lung disease.    Education: Flexibility, Balance, Mind/Body Relaxation: - Group  verbal and visual presentation with interactive activity on the components of exercise prescription. Introduces F.I.T.T principle from ACSM for exercise prescriptions. Reviews F.I.T.T. principles of flexibility and balance exercise training including progression. Also discusses the mind body connection.  Reviews various relaxation techniques to help reduce and manage stress (i.e. Deep breathing, progressive muscle relaxation, and visualization). Balance handout provided to take home. Written material given at graduation. Flowsheet Row Cardiac Rehab from 05/28/2021 in [facility] Cardiac and Pulmonary Rehab  Date 05/21/21  Educator [facility]  Instruction Review Code 1- Verbalizes Understanding       Activity Barriers & Risk Stratification:  Activity Barriers & Cardiac Risk Stratification - 05/12/21 1034       Activity Barriers & Cardiac Risk Stratification   Activity Barriers None    Cardiac Risk Stratification High             6 Minute Walk:  6 Minute Walk     Row Name 05/12/21 1030         6 Minute Walk   Phase Initial     Distance 1555 feet     Walk Time 6 minutes     # of Rest Breaks 0     MPH 2.95     METS 3.26     RPE 9     VO2 Peak 11.39     Symptoms No     Resting HR 65 bpm     Resting BP 134/62     Resting Oxygen Saturation  98 %     Exercise Oxygen Saturation  during 6 min walk 97 %     Max Ex. HR 99 bpm     Max Ex. BP 146/74     2 Minute Post BP 126/62              Oxygen Initial Assessment:   Oxygen Re-Evaluation:   Oxygen Discharge (Final Oxygen Re-Evaluation):   Initial Exercise Prescription:  Initial Exercise Prescription - 05/12/21 1000       Date of Initial Exercise RX and Referring Provider   Date 05/12/21    Referring Provider Paraschos, Alexander MD      Treadmill   MPH 2.9    Grade 0.5    Minutes 15    METs 3.42      REL-XR   Level 1    Speed 50    Minutes 15    METs 3      Biostep-RELP   Level 2    SPM 50    Minutes 15     METs 3      Track   Laps 41    Minutes 15    METs 3.22      Prescription Details   Frequency (times per week) 3    Duration Progress to 30 minutes of continuous aerobic without signs/symptoms of physical distress      Intensity   THRR 40-80% of Max Heartrate 97-129    Ratings of Perceived Exertion 11-13    Perceived Dyspnea 0-4      Progression   Progression Continue to progress workloads to maintain intensity without signs/symptoms of physical  distress.      Resistance Training   Training Prescription Yes    Weight 3 lb    Reps 10-15             Perform Capillary Blood Glucose checks as needed.  Exercise Prescription Changes:   Exercise Prescription Changes     Row Name 05/12/21 1000 05/26/21 0900           Response to Exercise   Blood Pressure (Admit) 134/62 134/62      Blood Pressure (Exercise) 146/74 124/70      Blood Pressure (Exit) 126/62 124/66      Heart Rate (Admit) 65 bpm 64 bpm      Heart Rate (Exercise) 99 bpm 105 bpm      Heart Rate (Exit) 67 bpm 92 bpm      Oxygen Saturation (Admit) 98 % --      Oxygen Saturation (Exercise) 97 % --      Rating of Perceived Exertion (Exercise) 9 12      Symptoms none none      Comments walk test results first full day of exercise      Duration -- Continue with 30 min of aerobic exercise without signs/symptoms of physical distress.      Intensity -- THRR unchanged             Progression      Progression -- Continue to progress workloads to maintain intensity without signs/symptoms of physical distress.      Average METs -- 4.24             Resistance Training      Training Prescription -- Yes      Weight -- 3 lb      Reps -- 10-15             Treadmill      MPH -- 2.9      Grade -- 3      Minutes -- 15      METs -- 4.42             REL-XR      Level -- 5      Minutes -- 15      METs -- 5.2             Track      Laps -- 38      Minutes -- 15      METs -- 3.07              Exercise  Comments:   Exercise Goals and Review:   Exercise Goals     Row Name 05/12/21 1037             Exercise Goals   Increase Physical Activity Yes       Intervention Provide advice, education, support and counseling about physical activity/exercise needs.;Develop an individualized exercise prescription for aerobic and resistive training based on initial evaluation findings, risk stratification, comorbidities and participant's personal goals.       Expected Outcomes Short Term: Attend rehab on a regular basis to increase amount of physical activity.;Long Term: Add in home exercise to make exercise part of routine and to increase amount of physical activity.;Long Term: Exercising regularly at least 3-5 days a week.       Increase Strength and Stamina Yes       Intervention Provide advice, education, support and counseling about physical activity/exercise needs.;Develop an individualized exercise prescription for aerobic and resistive training  based on initial evaluation findings, risk stratification, comorbidities and participant's personal goals.       Expected Outcomes Short Term: Increase workloads from initial exercise prescription for resistance, speed, and METs.;Short Term: Perform resistance training exercises routinely during rehab and add in resistance training at home;Long Term: Improve cardiorespiratory fitness, muscular endurance and strength as measured by increased METs and functional capacity ( )       Able to understand and use rate of perceived exertion (RPE) scale Yes       Intervention Provide education and explanation on how to use RPE scale       Expected Outcomes Short Term: Able to use RPE daily in rehab to express subjective intensity level;Long Term:  Able to use RPE to guide intensity level when exercising independently       Able to understand and use Dyspnea scale Yes       Intervention Provide education and explanation on how to use Dyspnea scale       Expected  Outcomes Short Term: Able to use Dyspnea scale daily in rehab to express subjective sense of shortness of breath during exertion;Long Term: Able to use Dyspnea scale to guide intensity level when exercising independently       Knowledge and understanding of Target Heart Rate Range (THRR) Yes       Intervention Provide education and explanation of THRR including how the numbers were predicted and where they are located for reference       Expected Outcomes Short Term: Able to state/look up THRR;Short Term: Able to use daily as guideline for intensity in rehab;Long Term: Able to use THRR to govern intensity when exercising independently       Able to check pulse independently Yes       Intervention Provide education and demonstration on how to check pulse in carotid and radial arteries.;Review the importance of being able to check your own pulse for safety during independent exercise       Expected Outcomes Short Term: Able to explain why pulse checking is important during independent exercise;Long Term: Able to check pulse independently and accurately       Understanding of Exercise Prescription Yes       Intervention Provide education, explanation, and written materials on patient's individual exercise prescription       Expected Outcomes Short Term: Able to explain program exercise prescription;Long Term: Able to explain home exercise prescription to exercise independently                Exercise Goals Re-Evaluation :  Exercise Goals Re-Evaluation     Row Name 05/26/21 1002             Exercise Goal Re-Evaluation   Exercise Goals Review Increase Physical Activity;Increase Strength and Stamina       Comments Gibson is doing great her first couple of weeks being at rehab. She has already increased her load on the XR to level 5 and increased incline on the treadmill to 3%. Will continue to monitor.       Expected Outcomes Short: Continue to increase loads on machines and track Long: Continue  to build overall strength and stamina                Discharge Exercise Prescription (Final Exercise Prescription Changes):  Exercise Prescription Changes - 05/26/21 0900       Response to Exercise   Blood Pressure (Admit) 134/62    Blood Pressure (Exercise) 124/70    Blood  Pressure (Exit) 124/66    Heart Rate (Admit) 64 bpm    Heart Rate (Exercise) 105 bpm    Heart Rate (Exit) 92 bpm    Rating of Perceived Exertion (Exercise) 12    Symptoms none    Comments first full day of exercise    Duration Continue with 30 min of aerobic exercise without signs/symptoms of physical distress.    Intensity THRR unchanged      Progression   Progression Continue to progress workloads to maintain intensity without signs/symptoms of physical distress.    Average METs 4.24      Resistance Training   Training Prescription Yes    Weight 3 lb    Reps 10-15      Treadmill   MPH 2.9    Grade 3    Minutes 15    METs 4.42      REL-XR   Level 5    Minutes 15    METs 5.2      Track   Laps 38    Minutes 15    METs 3.07             Nutrition:  Target Goals: Understanding of nutrition guidelines, daily intake of sodium 1500mg , cholesterol 200mg , calories 30% from fat and 7% or less from saturated fats, daily to have 5 or more servings of fruits and vegetables.  Education: All About Nutrition: -Group instruction provided by verbal, written material, interactive activities, discussions, models, and posters to present general guidelines for heart healthy nutrition including fat, fiber, MyPlate, the role of sodium in heart healthy nutrition, utilization of the nutrition label, and utilization of this knowledge for meal planning. Follow up email sent as well. Written material given at graduation. Flowsheet Row Cardiac Rehab from 05/28/2021 in [facility] Cardiac and Pulmonary Rehab  Date 05/28/21  Educator [facility]  Instruction Review Code 1- Verbalizes Understanding       Biometrics:  Pre  Biometrics - 05/12/21 1037       Pre Biometrics   Height 4' 10.4" (1.483 m)    Weight 104 lb 3.2 oz (47.3 kg)    BMI (Calculated) 21.49    Single Leg Stand 30 seconds              Nutrition Therapy Plan and Nutrition Goals:  Nutrition Therapy & Goals - 05/12/21 1038       Intervention Plan   Intervention Prescribe, educate and counsel regarding individualized specific dietary modifications aiming towards targeted core components such as weight, hypertension, lipid management, diabetes, heart failure and other comorbidities.    Expected Outcomes Short Term Goal: Understand basic principles of dietary content, such as calories, fat, sodium, cholesterol and nutrients.;Short Term Goal: A plan has been developed with personal nutrition goals set during dietitian appointment.;Long Term Goal: Adherence to prescribed nutrition plan.             Nutrition Assessments:  MEDIFICTS Score Key: ?70 Need to make dietary changes  40-70 Heart Healthy Diet ? 40 Therapeutic Level Cholesterol Diet  Flowsheet Row Cardiac Rehab from 05/12/2021 in [facility] Cardiac and Pulmonary Rehab  Picture Your Plate Total Score on Admission 81      Picture Your Plate Scores: <16 Unhealthy dietary pattern with much room for improvement. 41-50 Dietary pattern unlikely to meet recommendations for good health and room for improvement. 51-60 More healthful dietary pattern, with some room for improvement.  >60 Healthy dietary pattern, although there may be some specific behaviors that could be improved.  Nutrition Goals Re-Evaluation:   Nutrition Goals Discharge (Final Nutrition Goals Re-Evaluation):   Psychosocial: Target Goals: Acknowledge presence or absence of significant depression and/or stress, maximize coping skills, provide positive support system. Participant is able to verbalize types and ability to use techniques and skills needed for reducing stress and depression.   Education: Stress,  Anxiety, and Depression - Group verbal and visual presentation to define topics covered.  Reviews how body is impacted by stress, anxiety, and depression.  Also discusses healthy ways to reduce stress and to treat/manage anxiety and depression.  Written material given at graduation.   Education: Sleep Hygiene -Provides group verbal and written instruction about how sleep can affect your health.  Define sleep hygiene, discuss sleep cycles and impact of sleep habits. Review good sleep hygiene tips.    Initial Review & Psychosocial Screening:  Initial Psych Review & Screening - 05/02/21 1409       Initial Review   Current issues with None Identified      Family Dynamics   Good Support System? Yes      Barriers   Psychosocial barriers to participate in program There are no identifiable barriers or psychosocial needs.;The patient should benefit from training in stress management and relaxation.      Screening Interventions   Interventions Provide feedback about the scores to participant;Encouraged to exercise;To provide support and resources with identified psychosocial needs    Expected Outcomes Short Term goal: Utilizing psychosocial counselor, staff and physician to assist with identification of specific Stressors or current issues interfering with healing process. Setting desired goal for each stressor or current issue identified.;Long Term Goal: Stressors or current issues are controlled or eliminated.;Short Term goal: Identification and review with participant of any Quality of Life or Depression concerns found by scoring the questionnaire.;Long Term goal: The participant improves quality of Life and PHQ9 Scores as seen by post scores and/or verbalization of changes             Quality of Life Scores:   Quality of Life - 05/12/21 1038       Quality of Life   Select Quality of Life      Quality of Life Scores   Health/Function Pre 26.7 %    Socioeconomic Pre 25.36 %     Psych/Spiritual Pre 24.93 %    Family Pre 16.4 %    GLOBAL Pre 24.54 %            Scores of 19 and below usually indicate a poorer quality of life in these areas.  A difference of  2-3 points is a clinically meaningful difference.  A difference of 2-3 points in the total score of the Quality of Life Index has been associated with significant improvement in overall quality of life, self-image, physical symptoms, and general health in studies assessing change in quality of life.  PHQ-9: Recent Review Flowsheet Data     Dep[facility]   Decreased Interest 0   Down, Depressed, Hopeless 0   PHQ - 2 Score 0   Altered sleeping 0   Tired, decreased energy 0   Change in appetite 0   Feeling bad or failure about yourself  0   Trouble concentrating 0   Moving slowly or fidgety/restless 0   Suicidal thoughts 0   PHQ-9 Score 0   Difficult doing work/chores Not difficult at all      Interpretation of Total Score  Total Score Depression Severity:  1-4 = Minimal depression,  5-9 = Mild depression, 10-14 = Moderate depression, 15-19 = Moderately severe depression, 20-27 = Severe depression   Psychosocial Evaluation and Intervention:  Psychosocial Evaluation - 05/02/21 1430       Psychosocial Evaluation & Interventions   Comments Ms. Valvano reports doing well post NSTEMI. She nevery experienced any pain and is glad her heart had naturally compensated for the lack of blood flow by building collaterals. 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 She is happy to report that she doesn't notice any deficits from the stroke. Prior to covid [facility] [employer] and is looking at getting back to an active lifestyle. She is very involved in her family's lives and enjoys her family time.    Expected Outcomes Short: attend cardiac rehab for education and exercise. Long: develop and maintain positive self care habits.    Continue  Psychosocial Services  Follow up required by staff             Psychosocial Re-Evaluation:   Psychosocial Discharge (Final Psychosocial Re-Evaluation):   Vocational Rehabilitation: Provide vocational rehab assistance to qualifying candidates.   Vocational Rehab Evaluation & Intervention:  Vocational Rehab - 05/02/21 1409       Initial Vocational Rehab Evaluation & Intervention   Assessment shows need for Vocational Rehabilitation No             Education: Education Goals: Education classes will be provided on a variety of topics geared toward better understanding of heart health and risk factor modification. Participant will state understanding/return demonstration of topics presented as noted by education test scores.  Learning Barriers/Preferences:  Learning Barriers/Preferences - 05/02/21 1409       Learning Barriers/Preferences   Learning Barriers None    Learning Preferences None             General Cardiac Education Topics:  AED/CPR: - Group verbal and written instruction with the use of models to demonstrate the basic use of the AED with the basic ABC's of resuscitation.   Anatomy and Cardiac Procedures: - Group verbal and visual presentation and models provide information about basic cardiac anatomy and function. Reviews the testing methods done to diagnose heart disease and the outcomes of the test results. Describes the treatment choices: Medical Management, Angioplasty, or Coronary Bypass Surgery for treating various heart conditions including Myocardial Infarction, Angina, Valve Disease, and Cardiac Arrhythmias.  Written material given at graduation. Flowsheet Row Cardiac Rehab from 05/28/2021 in [facility] Cardiac and Pulmonary Rehab  Date 05/14/21  Educator [facility]  Instruction Review Code 1- Verbalizes Understanding       Medication Safety: - Group verbal and visual instruction to review commonly prescribed medications for heart and lung disease.  Reviews the medication, class of the drug, and side effects. Includes the steps to properly store meds and maintain the prescription regimen.  Written material given at graduation.   Intimacy: - Group verbal instruction through game format to discuss how heart and lung disease can affect sexual intimacy. Written material given at graduation..   Know Your Numbers and Heart Failure: - Group verbal and visual instruction to discuss disease risk factors for cardiac and pulmonary disease and treatment options.  Reviews associated critical values for Overweight/Obesity, Hypertension, Cholesterol, and Diabetes.  Discusses basics of heart failure: signs/symptoms and treatments.  Introduces Heart Failure Zone chart for action plan for heart failure.  Written material given at graduation.   Infection Prevention: -  Provides verbal and written material to individual with discussion of infection control including proper hand washing and proper equipment cleaning during exercise session. Flowsheet Row Cardiac Rehab from 05/28/2021 in [facility] Cardiac and Pulmonary Rehab  Date 05/12/21  Educator [facility]  Instruction Review Code 1- Verbalizes Understanding       Falls Prevention: - Provides verbal and written material to individual with discussion of falls prevention and safety. Flowsheet Row Cardiac Rehab from 05/28/2021 in [facility] Cardiac and Pulmonary Rehab  Date 05/12/21  Educator [facility]  Instruction Review Code 1- Verbalizes Understanding       Other: -Provides group and verbal instruction on various topics (see comments)   Knowledge Questionnaire Score:  Knowledge Questionnaire Score - 05/12/21 1038       Knowledge Questionnaire Score   Pre Score 26/26             Core Components/Risk Factors/Patient Goals at Admission:  Personal Goals and Risk Factors at Admission - 05/12/21 1039       Core Components/Risk Factors/Patient Goals on Admission    Weight Management Yes;Weight Maintenance     Intervention Weight Management: Develop a combined nutrition and exercise program designed to reach desired caloric intake, while maintaining appropriate intake of nutrient and fiber, sodium and fats, and appropriate energy expenditure required for the weight goal.;Weight Management: Provide education and appropriate resources to help participant work on and attain dietary goals.    Admit Weight 104 lb 3.2 oz (47.3 kg)    Goal Weight: Short Term 104 lb (47.2 kg)    Goal Weight: Long Term 104 lb (47.2 kg)    Expected Outcomes Short Term: Continue to assess and modify interventions until short term weight is achieved;Long Term: Adherence to nutrition and physical activity/exercise program aimed toward attainment of established weight goal;Weight Maintenance: Understanding of the daily nutrition guidelines, which includes 25-35% calories from fat, 7% or less cal from saturated fats, less than 200mg  cholesterol, less than 1.5gm of sodium, & 5 or more servings of fruits and vegetables daily    Hypertension Yes    Intervention Provide education on lifestyle modifcations including regular physical activity/exercise, weight management, moderate sodium restriction and increased consumption of fresh fruit, vegetables, and low fat dairy, alcohol moderation, and smoking cessation.;Monitor prescription use compliance.    Expected Outcomes Short Term: Continued assessment and intervention until BP is < 140/58mm HG in hypertensive participants. < 130/76mm HG in hypertensive participants with diabetes, heart failure or chronic kidney disease.;Long Term: Maintenance of blood pressure at goal levels.    Lipids Yes    Intervention Provide education and support for participant on nutrition & aerobic/resistive exercise along with prescribed medications to achieve LDL 70mg , HDL >40mg .    Expected Outcomes Short Term: Participant states understanding of desired cholesterol values and is compliant with medications prescribed.  Participant is following exercise prescription and nutrition guidelines.;Long Term: Cholesterol controlled with medications as prescribed, with individualized exercise RX and with personalized nutrition plan. Value goals: LDL < 70mg , HDL > 40 mg.             Education:Diabetes - Individual verbal and written instruction to review signs/symptoms of diabetes, desired ranges of glucose level fasting, after meals and with exercise. Acknowledge that pre and post exercise glucose checks will be done for 3 sessions at entry of program.   Core Components/Risk Factors/Patient Goals Review:    Core Components/Risk Factors/Patient Goals at Discharge (Final Review):    ITP Comments:  ITP Comments     Row  Name 05/02/21 1414 05/12/21 1029 05/28/21 1119       ITP Comments Initial telephone orientation completed. Diagnosis can be found in [facility] 6/2. EP orientation scheduled for Monday 6/27 at 8am. Completed and gym orientation. Initial ITP created and sent for review to Dr. Bethann Punches, Medical Director. 30 Day review completed. Medical Director ITP review done, changes made as directed, and signed approval by Medical Director.              Comments:

## 2021-05-30 ENCOUNTER — Encounter: Payer: Medicare PPO | Admitting: *Deleted

## 2021-05-30 ENCOUNTER — Other Ambulatory Visit: Payer: Self-pay

## 2021-05-30 DIAGNOSIS — I214 Non-ST elevation (NSTEMI) myocardial infarction: Secondary | ICD-10-CM

## 2021-05-30 DIAGNOSIS — Z5189 Encounter for other specified aftercare: Secondary | ICD-10-CM | POA: Diagnosis not present

## 2021-05-30 NOTE — Progress Notes (Signed)
Daily Session Note  Patient Details  Name: Victoria [name] MRN: [id number] Date of Birth: February 11, 1946 Referring Provider:   Flowsheet Row Cardiac Rehab from 05/12/2021 in [facility] Cardiac and Pulmonary Rehab  Referring Provider Isaias Cowman MD       Encounter Date: 05/30/2021  Check In:  Session Check In - 05/30/21 1123       Check-In   Supervising physician immediately available to respond to emergencies See telemetry face sheet for immediately available ER MD    Location [facility]-Cardiac & Pulmonary Rehab    Staff Present Renita Papa, RN BSN;Joseph Tessie Fass, RCP,RRT,BSRT;Melissa [place], [place], LDN    Virtual Visit No    Medication changes reported     No    Fall or balance concerns reported    No    Warm-up and Cool-down Performed on first and last piece of equipment    Resistance Training Performed Yes    VAD Patient? No    PAD/SET Patient? No      Pain Assessment   Currently in Pain? No/denies                Social History   Tobacco Use  Smoking Status Never  Smokeless Tobacco Never    Goals Met:  Independence with exercise equipment Exercise tolerated well No report of cardiac concerns or symptoms Strength training completed today  Goals Unmet:  Not Applicable  Comments: Pt able to follow exercise prescription today without complaint.  Will continue to monitor for progression.    Dr. Emily Filbert is Medical Director for [place].  Dr. Ottie Glazier is Medical Director for [facility] Pulmonary Rehabilitation.

## 2021-06-02 ENCOUNTER — Encounter: Payer: Medicare PPO | Admitting: *Deleted

## 2021-06-02 ENCOUNTER — Other Ambulatory Visit: Payer: Self-pay

## 2021-06-02 DIAGNOSIS — Z5189 Encounter for other specified aftercare: Secondary | ICD-10-CM | POA: Diagnosis not present

## 2021-06-02 DIAGNOSIS — I214 Non-ST elevation (NSTEMI) myocardial infarction: Secondary | ICD-10-CM

## 2021-06-02 NOTE — Progress Notes (Signed)
Daily Session Note  Patient Details  Name: Victoria [name] MRN: [id number] Date of Birth: 01-12-1946 Referring Provider:   Flowsheet Row Cardiac Rehab from 05/12/2021 in [facility] Cardiac and Pulmonary Rehab  Referring Provider Isaias Cowman MD       Encounter Date: 06/02/2021  Check In:  Session Check In - 06/02/21 1139       Check-In   Supervising physician immediately available to respond to emergencies See telemetry face sheet for immediately available ER MD    Location [facility]-Cardiac & Pulmonary Rehab    Staff Present Heath Lark, RN, BSN, CCRP;Kelly [place], MPA, RN;Joseph [place], RCP,RRT,BSRT;Kelly [place], BS, ACSM CEP, Exercise Physiologist    Virtual Visit No    Medication changes reported     No    Fall or balance concerns reported    No    Warm-up and Cool-down Performed on first and last piece of equipment    Resistance Training Performed Yes    VAD Patient? No    PAD/SET Patient? No      Pain Assessment   Currently in Pain? No/denies                Social History   Tobacco Use  Smoking Status Never  Smokeless Tobacco Never    Goals Met:  Independence with exercise equipment Exercise tolerated well No report of cardiac concerns or symptoms  Goals Unmet:  Not Applicable  Comments: Pt able to follow exercise prescription today without complaint.  Will continue to monitor for progression.    Dr. Emily Filbert is Medical Director for [place].  Dr. Ottie Glazier is Medical Director for [facility] Pulmonary Rehabilitation.

## 2021-06-04 ENCOUNTER — Other Ambulatory Visit: Payer: Self-pay

## 2021-06-04 DIAGNOSIS — I214 Non-ST elevation (NSTEMI) myocardial infarction: Secondary | ICD-10-CM

## 2021-06-04 DIAGNOSIS — Z5189 Encounter for other specified aftercare: Secondary | ICD-10-CM | POA: Diagnosis not present

## 2021-06-04 NOTE — Progress Notes (Signed)
Daily Session Note  Patient Details  Name: Victoria [name] MRN: [id number] Date of Birth: September 08, 1946 Referring Provider:   Flowsheet Row Cardiac Rehab from 05/12/2021 in [facility] Cardiac and Pulmonary Rehab  Referring Provider Isaias Cowman MD       Encounter Date: 06/04/2021  Check In:  Session Check In - 06/04/21 1043       Check-In   Supervising physician immediately available to respond to emergencies See telemetry face sheet for immediately available ER MD    Location [facility]-Cardiac & Pulmonary Rehab    Staff Present Birdie Sons, MPA, RN;Melissa [place], RDN, Rowe Pavy, BA, ACSM CEP, Exercise Physiologist;Joseph [place], [place]    Virtual Visit No    Medication changes reported     No    Fall or balance concerns reported    No    Warm-up and Cool-down Performed on first and last piece of equipment    Resistance Training Performed Yes    VAD Patient? No    PAD/SET Patient? No      Pain Assessment   Currently in Pain? No/denies                Social History   Tobacco Use  Smoking Status Never  Smokeless Tobacco Never    Goals Met:  Independence with exercise equipment Exercise tolerated well No report of cardiac concerns or symptoms Strength training completed today  Goals Unmet:  Not Applicable  Comments: Pt able to follow exercise prescription today without complaint.  Will continue to monitor for progression.    Dr. Emily Filbert is Medical Director for [place].  Dr. Ottie Glazier is Medical Director for [facility] Pulmonary Rehabilitation.

## 2021-06-06 ENCOUNTER — Encounter: Payer: Medicare PPO | Admitting: *Deleted

## 2021-06-06 ENCOUNTER — Other Ambulatory Visit: Payer: Self-pay

## 2021-06-06 DIAGNOSIS — I214 Non-ST elevation (NSTEMI) myocardial infarction: Secondary | ICD-10-CM

## 2021-06-06 DIAGNOSIS — Z5189 Encounter for other specified aftercare: Secondary | ICD-10-CM | POA: Diagnosis not present

## 2021-06-06 NOTE — Progress Notes (Signed)
Daily Session Note  Patient Details  Name: Victoria [name] MRN: [id number] Date of Birth: [birth year] Referring Provider:   Flowsheet Row Cardiac Rehab from 05/12/2021 in [facility] Cardiac and Pulmonary Rehab  Referring Provider Isaias Cowman MD       Encounter Date: 06/06/2021  Check In:  Session Check In - 06/06/21 1114       Check-In   Supervising physician immediately available to respond to emergencies See telemetry face sheet for immediately available ER MD    Location [facility]-Cardiac & Pulmonary Rehab    Staff Present Renita Papa, RN BSN;Joseph [place], RCP,RRT,BSRT;Jessica [place], [place], RCEP, CCRP, CCET    Virtual Visit No    Medication changes reported     No    Fall or balance concerns reported    No    Warm-up and Cool-down Performed on first and last piece of equipment    Resistance Training Performed Yes    VAD Patient? No    PAD/SET Patient? No      Pain Assessment   Currently in Pain? No/denies                Social History   Tobacco Use  Smoking Status Never  Smokeless Tobacco Never    Goals Met:  Independence with exercise equipment Exercise tolerated well No report of cardiac concerns or symptoms Strength training completed today  Goals Unmet:  Not Applicable  Comments: Pt able to follow exercise prescription today without complaint.  Will continue to monitor for progression.    Dr. Emily Filbert is Medical Director for [place].  Dr. Ottie Glazier is Medical Director for [facility] Pulmonary Rehabilitation.

## 2021-06-09 ENCOUNTER — Other Ambulatory Visit: Payer: Self-pay

## 2021-06-09 ENCOUNTER — Encounter: Payer: Medicare PPO | Admitting: *Deleted

## 2021-06-09 NOTE — Progress Notes (Deleted)
Daily Session Note  Patient Details  Name: Victoria [name] MRN: [id number] Date of Birth: 10-15-1946 Referring Provider:   Flowsheet Row Cardiac Rehab from 05/12/2021 in [facility] Cardiac and Pulmonary Rehab  Referring Provider Isaias Cowman MD       Encounter Date: 06/09/2021  Check In:  Session Check In - 06/09/21 1056       Check-In   Supervising physician immediately available to respond to emergencies See telemetry face sheet for immediately available ER MD    Location [facility]-Cardiac & Pulmonary Rehab    Staff Present Renita Papa, RN BSN;Joseph [place], RCP,RRT,BSRT;Kelly [place], BS, ACSM CEP, Exercise Physiologist;Kelly Rosalia Hammers, MPA, RN    Virtual Visit No    Medication changes reported     No    Fall or balance concerns reported    No    Warm-up and Cool-down Performed on first and last piece of equipment    Resistance Training Performed Yes    VAD Patient? No    PAD/SET Patient? No      Pain Assessment   Currently in Pain? No/denies                Social History   Tobacco Use  Smoking Status Never  Smokeless Tobacco Never    Goals Met:  Independence with exercise equipment Exercise tolerated well No report of cardiac concerns or symptoms Strength training completed today  Goals Unmet:  Not Applicable  Comments: Pt able to follow exercise prescription today without complaint.  Will continue to monitor for progression.    Dr. Emily Filbert is Medical Director for [place].  Dr. Ottie Glazier is Medical Director for [facility] Pulmonary Rehabilitation.

## 2021-06-11 ENCOUNTER — Other Ambulatory Visit: Payer: Self-pay

## 2021-06-11 DIAGNOSIS — Z5189 Encounter for other specified aftercare: Secondary | ICD-10-CM | POA: Diagnosis not present

## 2021-06-11 DIAGNOSIS — I214 Non-ST elevation (NSTEMI) myocardial infarction: Secondary | ICD-10-CM

## 2021-06-11 NOTE — Progress Notes (Signed)
Daily Session Note  Patient Details  Name: Victoria [name] MRN: [id number] Date of Birth: [birth year] Referring Provider:   Flowsheet Row Cardiac Rehab from 05/12/2021 in [facility] Cardiac and Pulmonary Rehab  Referring Provider Isaias Cowman MD       Encounter Date: 06/11/2021  Check In:  Session Check In - 06/11/21 1046       Check-In   Supervising physician immediately available to respond to emergencies See telemetry face sheet for immediately available ER MD    Location [facility]-Cardiac & Pulmonary Rehab    Staff Present Birdie Sons, MPA, RN;Melissa [place], RDN, Rowe Pavy, BA, ACSM CEP, Exercise Physiologist;Joseph [place], [place]    Virtual Visit No    Medication changes reported     No    Fall or balance concerns reported    No    Warm-up and Cool-down Performed on first and last piece of equipment    Resistance Training Performed Yes    VAD Patient? No    PAD/SET Patient? No      Pain Assessment   Currently in Pain? No/denies                Social History   Tobacco Use  Smoking Status Never  Smokeless Tobacco Never    Goals Met:  Independence with exercise equipment Exercise tolerated well No report of cardiac concerns or symptoms Strength training completed today  Goals Unmet:  Not Applicable  Comments: Pt able to follow exercise prescription today without complaint.  Will continue to monitor for progression.    Dr. Emily Filbert is Medical Director for [place].  Dr. Ottie Glazier is Medical Director for [facility] Pulmonary Rehabilitation.

## 2021-06-13 ENCOUNTER — Other Ambulatory Visit: Payer: Self-pay

## 2021-06-13 ENCOUNTER — Encounter: Payer: Medicare PPO | Admitting: *Deleted

## 2021-06-13 DIAGNOSIS — Z5189 Encounter for other specified aftercare: Secondary | ICD-10-CM | POA: Diagnosis not present

## 2021-06-13 DIAGNOSIS — I214 Non-ST elevation (NSTEMI) myocardial infarction: Secondary | ICD-10-CM

## 2021-06-13 NOTE — Progress Notes (Signed)
Daily Session Note  Patient Details  Name: Victoria [name] MRN: [id number] Date of Birth: 1946/08/11 Referring Provider:   Flowsheet Row Cardiac Rehab from 05/12/2021 in [facility] Cardiac and Pulmonary Rehab  Referring Provider Isaias Cowman MD       Encounter Date: 06/13/2021  Check In:  Session Check In - 06/13/21 1132       Check-In   Supervising physician immediately available to respond to emergencies See telemetry face sheet for immediately available ER MD    Location [facility]-Cardiac & Pulmonary Rehab    Staff Present Justin Mend, RCP,RRT,BSRT;Kayia Billinger Sherryll Burger, RN Vickki Hearing, BA, ACSM CEP, Exercise Physiologist    Virtual Visit No    Medication changes reported     No    Fall or balance concerns reported    No    Warm-up and Cool-down Performed on first and last piece of equipment    Resistance Training Performed Yes    VAD Patient? No    PAD/SET Patient? No      Pain Assessment   Currently in Pain? No/denies                Social History   Tobacco Use  Smoking Status Never  Smokeless Tobacco Never    Goals Met:  Independence with exercise equipment Exercise tolerated well No report of cardiac concerns or symptoms Strength training completed today  Goals Unmet:  Not Applicable  Comments: Pt able to follow exercise prescription today without complaint.  Will continue to monitor for progression.    Dr. Emily Filbert is Medical Director for [place].  Dr. Ottie Glazier is Medical Director for [facility] Pulmonary Rehabilitation.

## 2021-06-16 ENCOUNTER — Other Ambulatory Visit: Payer: Self-pay

## 2021-06-16 ENCOUNTER — Encounter: Payer: Medicare PPO | Attending: Cardiology | Admitting: *Deleted

## 2021-06-16 DIAGNOSIS — Z5189 Encounter for other specified aftercare: Secondary | ICD-10-CM | POA: Insufficient documentation

## 2021-06-16 DIAGNOSIS — I252 Old myocardial infarction: Secondary | ICD-10-CM | POA: Insufficient documentation

## 2021-06-16 DIAGNOSIS — I214 Non-ST elevation (NSTEMI) myocardial infarction: Secondary | ICD-10-CM

## 2021-06-16 NOTE — Progress Notes (Signed)
Daily Session Note  Patient Details  Name: Victoria [name] MRN: [id number] Date of Birth: 03/31/1946 Referring Provider:   Flowsheet Row Cardiac Rehab from 05/12/2021 in [facility] Cardiac and Pulmonary Rehab  Referring Provider Paraschos, Alexander MD       Encounter Date: 06/16/2021  Check In:  Session Check In - 06/16/21 1100       Check-In   Supervising physician immediately available to respond to emergencies See telemetry face sheet for immediately available ER MD    Location [facility]-Cardiac & Pulmonary Rehab    Staff Present Meredith Craven, RN BSN;Kelly [place], BS, ACSM CEP, Exercise Physiologist;Kelly [place], MPA, RN;Joseph [place], RCP,RRT,BSRT    Virtual Visit No    Medication changes reported     No    Fall or balance concerns reported    No    Warm-up and Cool-down Performed on first and last piece of equipment    Resistance Training Performed Yes    VAD Patient? No    PAD/SET Patient? No      Pain Assessment   Currently in Pain? No/denies                Social History   Tobacco Use  Smoking Status Never  Smokeless Tobacco Never    Goals Met:  Independence with exercise equipment Exercise tolerated well No report of cardiac concerns or symptoms Strength training completed today  Goals Unmet:  Not Applicable  Comments: Pt able to follow exercise prescription today without complaint.  Will continue to monitor for progression.    Dr. Mark Miller is Medical Director for [facility] Cardiac Rehabilitation.  Dr. Fuad Aleskerov is Medical Director for [facility] Pulmonary Rehabilitation. 

## 2021-06-18 ENCOUNTER — Other Ambulatory Visit: Payer: Self-pay

## 2021-06-18 DIAGNOSIS — Z5189 Encounter for other specified aftercare: Secondary | ICD-10-CM | POA: Diagnosis not present

## 2021-06-18 DIAGNOSIS — I214 Non-ST elevation (NSTEMI) myocardial infarction: Secondary | ICD-10-CM

## 2021-06-18 NOTE — Progress Notes (Signed)
Reviewed home exercise with pt today.  Pt plans to continue walking and using staff videos at home for exercise.  Reviewed THR, pulse, RPE, sign and symptoms, pulse oximetery and when to call 911 or MD.  Also discussed weather considerations and indoor options.  Pt voiced understanding.

## 2021-06-18 NOTE — Progress Notes (Signed)
Daily Session Note  Patient Details  Name: Victoria [name] MRN: [id number] Date of Birth: [birth year] Referring Provider:   Flowsheet Row Cardiac Rehab from 05/12/2021 in [facility] Cardiac and Pulmonary Rehab  Referring Provider Isaias Cowman MD       Encounter Date: 06/18/2021  Check In:  Session Check In - 06/18/21 1135       Check-In   Supervising physician immediately available to respond to emergencies See telemetry face sheet for immediately available ER MD    Location [facility]-Cardiac & Pulmonary Rehab    Staff Present Alberteen Sam, MA, RCEP, CCRP, CCET;Melissa [place], RDN, LDN;Keyana Guevara, RN,BC,MSN    Virtual Visit No    Medication changes reported     No    Fall or balance concerns reported    No    Warm-up and Cool-down Performed on first and last piece of equipment    Resistance Training Performed Yes    VAD Patient? No    PAD/SET Patient? No      Pain Assessment   Currently in Pain? No/denies    Multiple Pain Sites No                Social History   Tobacco Use  Smoking Status Never  Smokeless Tobacco Never    Goals Met:  Independence with exercise equipment Exercise tolerated well No report of cardiac concerns or symptoms  Goals Unmet:  Not Applicable  Comments: Pt able to follow exercise prescription today without complaint.  Will continue to monitor for progression.    Dr. Emily Filbert is Medical Director for [place].  Dr. Ottie Glazier is Medical Director for [facility] Pulmonary Rehabilitation.

## 2021-06-20 ENCOUNTER — Encounter: Payer: Medicare PPO | Admitting: *Deleted

## 2021-06-20 ENCOUNTER — Other Ambulatory Visit: Payer: Self-pay

## 2021-06-20 DIAGNOSIS — Z5189 Encounter for other specified aftercare: Secondary | ICD-10-CM | POA: Diagnosis not present

## 2021-06-20 DIAGNOSIS — I214 Non-ST elevation (NSTEMI) myocardial infarction: Secondary | ICD-10-CM

## 2021-06-20 NOTE — Progress Notes (Signed)
Daily Session Note  Patient Details  Name: Victoria [name] MRN: [id number] Date of Birth: 1946/06/02 Referring Provider:   Flowsheet Row Cardiac Rehab from 05/12/2021 in [facility] Cardiac and Pulmonary Rehab  Referring Provider Isaias Cowman MD       Encounter Date: 06/20/2021  Check In:  Session Check In - 06/20/21 1104       Check-In   Supervising physician immediately available to respond to emergencies See telemetry face sheet for immediately available ER MD    Location [facility]-Cardiac & Pulmonary Rehab    Staff Present Renita Papa, RN BSN;Joseph [place], RCP,RRT,BSRT;Jessica [place], [place], RCEP, CCRP, CCET    Virtual Visit No    Medication changes reported     No    Fall or balance concerns reported    No    Warm-up and Cool-down Performed on first and last piece of equipment    Resistance Training Performed Yes    VAD Patient? No    PAD/SET Patient? No      Pain Assessment   Currently in Pain? No/denies                Social History   Tobacco Use  Smoking Status Never  Smokeless Tobacco Never    Goals Met:  Independence with exercise equipment Exercise tolerated well No report of cardiac concerns or symptoms Strength training completed today  Goals Unmet:  Not Applicable  Comments: Pt able to follow exercise prescription today without complaint.  Will continue to monitor for progression.    Dr. Emily Filbert is Medical Director for [place].  Dr. Ottie Glazier is Medical Director for [facility] Pulmonary Rehabilitation.

## 2021-06-25 ENCOUNTER — Encounter: Payer: Self-pay | Admitting: *Deleted

## 2021-06-25 DIAGNOSIS — I214 Non-ST elevation (NSTEMI) myocardial infarction: Secondary | ICD-10-CM

## 2021-06-25 NOTE — Progress Notes (Signed)
Cardiac Individual Treatment Plan  Patient Details  Name: Victoria [name] MRN: [id number] Date of Birth: Mar 27, 1946 Referring Provider:   Flowsheet Row Cardiac Rehab from 05/12/2021 in [facility] Cardiac and Pulmonary Rehab  Referring Provider Isaias Cowman MD       Initial Encounter Date:  Flowsheet Row Cardiac Rehab from 05/12/2021 in [facility] Cardiac and Pulmonary Rehab  Date 05/12/21       Visit Diagnosis: NSTEMI (non-ST elevated myocardial infarction) [facility])  Patient's Home Medications on Admission:  Current Outpatient Medications:    apixaban (ELIQUIS) 5 MG TABS tablet, Take 1 tablet (5 mg total) by mouth 2 (two) times daily., Disp: 60 tablet, Rfl: 2   atorvastatin (LIPITOR) 80 MG tablet, Take 1 tablet (80 mg total) by mouth daily., Disp: 30 tablet, Rfl: 1   b complex vitamins capsule, Take 1 capsule by mouth daily. (Patient not taking: No sig reported), Disp: , Rfl:    Cholecalciferol 25 MCG (1000 UT) tablet, Take 1 tablet by mouth daily., Disp: , Rfl:    clopidogrel (PLAVIX) 75 MG tablet, Take 1 tablet (75 mg total) by mouth daily with breakfast., Disp: 30 tablet, Rfl: 1   losartan (COZAAR) 50 MG tablet, Take 1 tablet (50 mg total) by mouth daily., Disp: 30 tablet, Rfl: 1   metoprolol tartrate (LOPRESSOR) 25 MG tablet, Take 1 tablet (25 mg total) by mouth 2 (two) times daily., Disp: 60 tablet, Rfl: 1   Multiple Vitamin (MULTI-VITAMIN) tablet, Take 1 tablet by mouth daily., Disp: , Rfl:   Past Medical History: Past Medical History:  Diagnosis Date   Arthritis    Headache    Hyperlipidemia    Hypertension     Tobacco Use: Social History   Tobacco Use  Smoking Status Never  Smokeless Tobacco Never    Labs: Recent Review Flowsheet Data     Labs for ITP Cardiac and Pulmonary Rehab Latest Ref Rng & Units 03/19/2021   Cholestrol 0 - 200 mg/dL 145   LDLCALC 0 - 99 mg/dL 82   HDL >40 mg/dL 44   Trlycerides <150 mg/dL 96   Hemoglobin A1c 4.8 - 5.6 % 5.7(H)         Exercise Target Goals: Exercise Program Goal: Individual exercise prescription set using results from initial 6 min walk test and THRR while considering  patient's activity barriers and safety.   Exercise Prescription Goal: Initial exercise prescription builds to 30-45 minutes a day of aerobic activity, 2-3 days per week.  Home exercise guidelines will be given to patient during program as part of exercise prescription that the participant will acknowledge.   Education: Aerobic Exercise: - Group verbal and visual presentation on the components of exercise prescription. Introduces F.I.T.T principle from ACSM for exercise prescriptions.  Reviews F.I.T.T. principles of aerobic exercise including progression. Written material given at graduation.   Education: Resistance Exercise: - Group verbal and visual presentation on the components of exercise prescription. Introduces F.I.T.T principle from ACSM for exercise prescriptions  Reviews F.I.T.T. principles of resistance exercise including progression. Written material given at graduation. Flowsheet Row Cardiac Rehab from 06/18/2021 in [facility] Cardiac and Pulmonary Rehab  Date 05/14/21  Educator [facility]  Instruction Review Code 1- Verbalizes Understanding        Education: Exercise & Equipment Safety: - Individual verbal instruction and demonstration of equipment use and safety with use of the equipment. Flowsheet Row Cardiac Rehab from 06/18/2021 in [facility] Cardiac and Pulmonary Rehab  Date 05/12/21  Educator [facility]  Instruction Review Code 1-  Verbalizes Understanding       Education: Exercise Physiology & General Exercise Guidelines: - Group verbal and written instruction with models to review the exercise physiology of the cardiovascular system and associated critical values. Provides general exercise guidelines with specific guidelines to those with heart or lung disease.    Education: Flexibility, Balance, Mind/Body Relaxation: - Group verbal  and visual presentation with interactive activity on the components of exercise prescription. Introduces F.I.T.T principle from ACSM for exercise prescriptions. Reviews F.I.T.T. principles of flexibility and balance exercise training including progression. Also discusses the mind body connection.  Reviews various relaxation techniques to help reduce and manage stress (i.e. Deep breathing, progressive muscle relaxation, and visualization). Balance handout provided to take home. Written material given at graduation. Flowsheet Row Cardiac Rehab from 06/18/2021 in [facility] Cardiac and Pulmonary Rehab  Date 05/21/21  Educator [facility]  Instruction Review Code 1- Verbalizes Understanding       Activity Barriers & Risk Stratification:  Activity Barriers & Cardiac Risk Stratification - 05/12/21 1034       Activity Barriers & Cardiac Risk Stratification   Activity Barriers None    Cardiac Risk Stratification High             6 Minute Walk:  6 Minute Walk     Row Name 05/12/21 1030         6 Minute Walk   Phase Initial     Distance 1555 feet     Walk Time 6 minutes     # of Rest Breaks 0     MPH 2.95     METS 3.26     RPE 9     VO2 Peak 11.39     Symptoms No     Resting HR 65 bpm     Resting BP 134/62     Resting Oxygen Saturation  98 %     Exercise Oxygen Saturation  during 6 min walk 97 %     Max Ex. HR 99 bpm     Max Ex. BP 146/74     2 Minute Post BP 126/62              Oxygen Initial Assessment:   Oxygen Re-Evaluation:   Oxygen Discharge (Final Oxygen Re-Evaluation):   Initial Exercise Prescription:  Initial Exercise Prescription - 05/12/21 1000       Date of Initial Exercise RX and Referring Provider   Date 05/12/21    Referring Provider Paraschos, Alexander MD      Treadmill   MPH 2.9    Grade 0.5    Minutes 15    METs 3.42      REL-XR   Level 1    Speed 50    Minutes 15    METs 3      Biostep-RELP   Level 2    SPM 50    Minutes 15    METs 3       Track   Laps 41    Minutes 15    METs 3.22      Prescription Details   Frequency (times per week) 3    Duration Progress to 30 minutes of continuous aerobic without signs/symptoms of physical distress      Intensity   THRR 40-80% of Max Heartrate 97-129    Ratings of Perceived Exertion 11-13    Perceived Dyspnea 0-4      Progression   Progression Continue to progress workloads to maintain intensity without signs/symptoms of physical  distress.      Resistance Training   Training Prescription Yes    Weight 3 lb    Reps 10-15             Perform Capillary Blood Glucose checks as needed.  Exercise Prescription Changes:   Exercise Prescription Changes     Row Name 05/12/21 1000 05/26/21 0900 06/09/21 1500 06/18/21 1100 06/23/21 1500     Response to Exercise   Blood Pressure (Admit) 134/62 134/62 112/62 -- 132/70   Blood Pressure (Exercise) 146/74 124/70 -- -- --   Blood Pressure (Exit) 126/62 124/66 106/60 -- 112/60   Heart Rate (Admit) 65 bpm 64 bpm 60 bpm -- 72 bpm   Heart Rate (Exercise) 99 bpm 105 bpm 103 bpm -- 119 bpm   Heart Rate (Exit) 67 bpm 92 bpm 76 bpm -- 107 bpm   Oxygen Saturation (Admit) 98 % -- -- -- --   Oxygen Saturation (Exercise) 97 % -- -- -- --   Rating of Perceived Exertion (Exercise) '9 12 12 ' -- 13   Symptoms none none none -- none   Comments walk test results first full day of exercise -- -- --   Duration -- Continue with 30 min of aerobic exercise without signs/symptoms of physical distress. Continue with 30 min of aerobic exercise without signs/symptoms of physical distress. -- Continue with 30 min of aerobic exercise without signs/symptoms of physical distress.   Intensity -- THRR unchanged THRR unchanged -- THRR unchanged     Progression   Progression -- Continue to progress workloads to maintain intensity without signs/symptoms of physical distress. Continue to progress workloads to maintain intensity without signs/symptoms of physical  distress. -- Continue to progress workloads to maintain intensity without signs/symptoms of physical distress.   Average METs -- 4.24 4.43 -- 5.36     Resistance Training   Training Prescription -- Yes Yes -- Yes   Weight -- 3 lb 3 lb -- 3 lb   Reps -- 10-15 10-15 -- 10-15     Interval Training   Interval Training -- -- No -- No     Treadmill   MPH -- 2.9 3.2 -- 2.9   Grade -- 3 6.5 -- 8.5   Minutes -- 15 15 -- 15   METs -- 4.42 6.3 -- 6.62     REL-XR   Level -- 5 6 -- --   Minutes -- 15 15 -- --   METs -- 5.2 2.9 -- --     Biostep-RELP   Level -- -- 2 -- --   Minutes -- -- 15 -- --     Track   Laps -- 38 40 -- --   Minutes -- 15 15 -- --   METs -- 3.07 3.18 -- --     Home Exercise Plan   Plans to continue exercise at -- -- -- Home (comment)  walking, staff videos Home (comment)  walking, staff videos   Frequency -- -- -- Add 2 additional days to program exercise sessions. Add 2 additional days to program exercise sessions.   Initial Home Exercises Provided -- -- -- 06/18/21 06/18/21            Exercise Comments:   Exercise Goals and Review:   Exercise Goals     Row Name 05/12/21 1037             Exercise Goals   Increase Physical Activity Yes       Intervention Provide advice, education, support  and counseling about physical activity/exercise needs.;Develop an individualized exercise prescription for aerobic and resistive training based on initial evaluation findings, risk stratification, comorbidities and participant's personal goals.       Expected Outcomes Short Term: Attend rehab on a regular basis to increase amount of physical activity.;Long Term: Add in home exercise to make exercise part of routine and to increase amount of physical activity.;Long Term: Exercising regularly at least 3-5 days a week.       Increase Strength and Stamina Yes       Intervention Provide advice, education, support and counseling about physical activity/exercise  needs.;Develop an individualized exercise prescription for aerobic and resistive training based on initial evaluation findings, risk stratification, comorbidities and participant's personal goals.       Expected Outcomes Short Term: Increase workloads from initial exercise prescription for resistance, speed, and METs.;Short Term: Perform resistance training exercises routinely during rehab and add in resistance training at home;Long Term: Improve cardiorespiratory fitness, muscular endurance and strength as measured by increased METs and functional capacity (6MWT)       Able to understand and use rate of perceived exertion (RPE) scale Yes       Intervention Provide education and explanation on how to use RPE scale       Expected Outcomes Short Term: Able to use RPE daily in rehab to express subjective intensity level;Long Term:  Able to use RPE to guide intensity level when exercising independently       Able to understand and use Dyspnea scale Yes       Intervention Provide education and explanation on how to use Dyspnea scale       Expected Outcomes Short Term: Able to use Dyspnea scale daily in rehab to express subjective sense of shortness of breath during exertion;Long Term: Able to use Dyspnea scale to guide intensity level when exercising independently       Knowledge and understanding of Target Heart Rate Range (THRR) Yes       Intervention Provide education and explanation of THRR including how the numbers were predicted and where they are located for reference       Expected Outcomes Short Term: Able to state/look up THRR;Short Term: Able to use daily as guideline for intensity in rehab;Long Term: Able to use THRR to govern intensity when exercising independently       Able to check pulse independently Yes       Intervention Provide education and demonstration on how to check pulse in carotid and radial arteries.;Review the importance of being able to check your own pulse for safety during  independent exercise       Expected Outcomes Short Term: Able to explain why pulse checking is important during independent exercise;Long Term: Able to check pulse independently and accurately       Understanding of Exercise Prescription Yes       Intervention Provide education, explanation, and written materials on patient's individual exercise prescription       Expected Outcomes Short Term: Able to explain program exercise prescription;Long Term: Able to explain home exercise prescription to exercise independently                Exercise Goals Re-Evaluation :  Exercise Goals Re-Evaluation     Row Name 05/26/21 1002 06/09/21 1515 06/18/21 1104 06/18/21 1136 06/23/21 1600     Exercise Goal Re-Evaluation   Exercise Goals Review Increase Physical Activity;Increase Strength and Stamina Increase Physical Activity;Increase Strength and Stamina;Understanding of Exercise Prescription Increase  Physical Activity;Increase Strength and Stamina;Understanding of Exercise Prescription Increase Physical Activity;Increase Strength and Stamina;Understanding of Exercise Prescription;Able to understand and use rate of perceived exertion (RPE) scale;Able to understand and use Dyspnea scale;Knowledge and understanding of Target Heart Rate Range (THRR);Able to check pulse independently Increase Physical Activity;Increase Strength and Stamina   Comments Alaiah is doing great her first couple of weeks being at rehab. She has already increased her load on the XR to level 5 and increased incline on the treadmill to 3%. Will continue to monitor. Quintara is doing well in rehab.  She is up to 30 watts on the bike and 6.5 % incline on the treadmill.  We will conitnue to monitor her progress. Eliot reports being very active and gardening. She will walk 2 miles everyday - she has an exercise bike, but she feels that it is very hard. She will do 7000 steps per day. Reviewed home exercise with pt today.  Pt plans to continue walking  and using staff videos at home for exercise.  Reviewed THR, pulse, RPE, sign and symptoms, pulse oximetery and when to call 911 or MD.  Also discussed weather considerations and indoor options.  Pt voiced understanding. Ilana attends consistently and reaches THR range.  Staff will encourage trying 4 lb for strength work.   Expected Outcomes Short: Continue to increase loads on machines and track Long: Continue to build overall strength and stamina Short: Try 4 lb hand weights Long: Conitnue to improve stamina. ST: EP to go over home exercise LT: Continue to improve stamina Short: Continue to add in walking at home Long: COntinue to improve stamina Short:  increase weight to 4lb Long: build overall stamina            Discharge Exercise Prescription (Final Exercise Prescription Changes):  Exercise Prescription Changes - 06/23/21 1500       Response to Exercise   Blood Pressure (Admit) 132/70    Blood Pressure (Exit) 112/60    Heart Rate (Admit) 72 bpm    Heart Rate (Exercise) 119 bpm    Heart Rate (Exit) 107 bpm    Rating of Perceived Exertion (Exercise) 13    Symptoms none    Duration Continue with 30 min of aerobic exercise without signs/symptoms of physical distress.    Intensity THRR unchanged      Progression   Progression Continue to progress workloads to maintain intensity without signs/symptoms of physical distress.    Average METs 5.36      Resistance Training   Training Prescription Yes    Weight 3 lb    Reps 10-15      Interval Training   Interval Training No      Treadmill   MPH 2.9    Grade 8.5    Minutes 15    METs 6.62      Home Exercise Plan   Plans to continue exercise at Home (comment)   walking, staff videos   Frequency Add 2 additional days to program exercise sessions.    Initial Home Exercises Provided 06/18/21             Nutrition:  Target Goals: Understanding of nutrition guidelines, daily intake of sodium <1549m, cholesterol <2062m  calories 30% from fat and 7% or less from saturated fats, daily to have 5 or more servings of fruits and vegetables.  Education: All About Nutrition: -Group instruction provided by verbal, written material, interactive activities, discussions, models, and posters to present general guidelines for heart healthy nutrition  including fat, fiber, MyPlate, the role of sodium in heart healthy nutrition, utilization of the nutrition label, and utilization of this knowledge for meal planning. Follow up email sent as well. Written material given at graduation. Flowsheet Row Cardiac Rehab from 06/18/2021 in [facility] Cardiac and Pulmonary Rehab  Date 05/28/21  Educator [facility]  Instruction Review Code 1- Verbalizes Understanding       Biometrics:  Pre Biometrics - 05/12/21 1037       Pre Biometrics   Height 4' 10.4" (1.483 m)    Weight 104 lb 3.2 oz (47.3 kg)    BMI (Calculated) 21.49    Single Leg Stand 30 seconds              Nutrition Therapy Plan and Nutrition Goals:  Nutrition Therapy & Goals - 06/16/21 1036       Nutrition Therapy   Diet Heart healthy, low Na    Drug/Food Interactions Statins/Certain Fruits    Protein (specify units) 50g    Fiber 25 grams    Whole Grain Foods 3 servings    Saturated Fats 12 max. grams    Fruits and Vegetables 8 servings/day    Sodium 1.5 grams      Personal Nutrition Goals   Nutrition Goal ST: add soymilk, try smoothies with avocados, penaut butter, and/or tofu to add protein and fat LT: meet calorie and protein needs, maintain/gain weight    Comments She tries not to snack and she has established meal times. She has a bread maker and she will add flaxseed and other grains to her bread. She always has cooked vegetables on hand. early B: coffee (black), scrambled egg with tomato and 1 slice of toast (whole wheat) or oatmeal with walnuts with bananas berries. L: soup (vegetables - variety: "everything but the kitchen sink" - beans as well) - she enjoys the  broth. About 3/4 cups of soup with water. Drinks: hot green tea and water. D: she will have 3-4 oz of meat if she eats it- usually just vegetables (beans, non starchy vegetables, and starchy vegetables) She will use olive oil or occasionally margarine to cook her foods. S: yogurt with walnuts (whatever is on sale). She does not feel hunger during the day and feels satiated with her meals. She was 140lbs 3 years ago, went to 120lbs 1 year ago, now 105lbs. Discussed weight maintenance/gain and ensure kcal and protein needs are met. Reviewed heart healthy eating. She would like to try smoothies. She is open to trying soymilk and making smoothies with tofu, peanut butter, and avocados.      Intervention Plan   Intervention Prescribe, educate and counsel regarding individualized specific dietary modifications aiming towards targeted core components such as weight, hypertension, lipid management, diabetes, heart failure and other comorbidities.    Expected Outcomes Short Term Goal: Understand basic principles of dietary content, such as calories, fat, sodium, cholesterol and nutrients.;Short Term Goal: A plan has been developed with personal nutrition goals set during dietitian appointment.;Long Term Goal: Adherence to prescribed nutrition plan.             Nutrition Assessments:  MEDIFICTS Score Key: ?70 Need to make dietary changes  40-70 Heart Healthy Diet ? 40 Therapeutic Level Cholesterol Diet  Flowsheet Row Cardiac Rehab from 05/12/2021 in [facility] Cardiac and Pulmonary Rehab  Picture Your Plate Total Score on Admission 81      Picture Your Plate Scores: <72 Unhealthy dietary pattern with much room for improvement. 41-50 Dietary pattern unlikely to  meet recommendations for good health and room for improvement. 51-60 More healthful dietary pattern, with some room for improvement.  >60 Healthy dietary pattern, although there may be some specific behaviors that could be improved.    Nutrition  Goals Re-Evaluation:   Nutrition Goals Discharge (Final Nutrition Goals Re-Evaluation):   Psychosocial: Target Goals: Acknowledge presence or absence of significant depression and/or stress, maximize coping skills, provide positive support system. Participant is able to verbalize types and ability to use techniques and skills needed for reducing stress and depression.   Education: Stress, Anxiety, and Depression - Group verbal and visual presentation to define topics covered.  Reviews how body is impacted by stress, anxiety, and depression.  Also discusses healthy ways to reduce stress and to treat/manage anxiety and depression.  Written material given at graduation.   Education: Sleep Hygiene -Provides group verbal and written instruction about how sleep can affect your health.  Define sleep hygiene, discuss sleep cycles and impact of sleep habits. Review good sleep hygiene tips.    Initial Review & Psychosocial Screening:  Initial Psych Review & Screening - 05/02/21 1409       Initial Review   Current issues with None Identified      Family Dynamics   Good Support System? Yes      Barriers   Psychosocial barriers to participate in program There are no identifiable barriers or psychosocial needs.;The patient should benefit from training in stress management and relaxation.      Screening Interventions   Interventions Provide feedback about the scores to participant;Encouraged to exercise;To provide support and resources with identified psychosocial needs    Expected Outcomes Short Term goal: Utilizing psychosocial counselor, staff and physician to assist with identification of specific Stressors or current issues interfering with healing process. Setting desired goal for each stressor or current issue identified.;Long Term Goal: Stressors or current issues are controlled or eliminated.;Short Term goal: Identification and review with participant of any Quality of Life or Depression  concerns found by scoring the questionnaire.;Long Term goal: The participant improves quality of Life and PHQ9 Scores as seen by post scores and/or verbalization of changes             Quality of Life Scores:   Quality of Life - 05/12/21 1038       Quality of Life   Select Quality of Life      Quality of Life Scores   Health/Function Pre 26.7 %    Socioeconomic Pre 25.36 %    Psych/Spiritual Pre 24.93 %    Family Pre 16.4 %    GLOBAL Pre 24.54 %            Scores of 19 and below usually indicate a poorer quality of life in these areas.  A difference of  2-3 points is a clinically meaningful difference.  A difference of 2-3 points in the total score of the Quality of Life Index has been associated with significant improvement in overall quality of life, self-image, physical symptoms, and general health in studies assessing change in quality of life.  PHQ-9: Recent Review Flowsheet Data     Depression screen [facility] 2/9 05/12/2021   Decreased Interest 0   Down, Depressed, Hopeless 0   PHQ - 2 Score 0   Altered sleeping 0   Tired, decreased energy 0   Change in appetite 0   Feeling bad or failure about yourself  0   Trouble concentrating 0   Moving slowly or fidgety/restless 0  Suicidal thoughts 0   PHQ-9 Score 0   Difficult doing work/chores Not difficult at all      Interpretation of Total Score  Total Score Depression Severity:  1-4 = Minimal depression, 5-9 = Mild depression, 10-14 = Moderate depression, 15-19 = Moderately severe depression, 20-27 = Severe depression   Psychosocial Evaluation and Intervention:  Psychosocial Evaluation - 05/02/21 1430       Psychosocial Evaluation & Interventions   Comments Ms. Hausmann reports doing well post NSTEMI. She nevery experienced any pain and is glad her heart had naturally compensated for the lack of blood flow by building collaterals. She did experience mini strokes in the hospital and went in and out of afib a few  times. She is happy to report that she doesn't notice any deficits from the stroke. Prior to covid [facility] [employer] and is looking at getting back to an active lifestyle. She is very involved in her family's lives and enjoys her family time.    Expected Outcomes Short: attend cardiac rehab for education and exercise. Long: develop and maintain positive self care habits.    Continue Psychosocial Services  Follow up required by staff             Psychosocial Re-Evaluation:  Psychosocial Re-Evaluation     [place] Name 06/18/21 1109             Psychosocial Re-Evaluation   Current issues with Current Stress Concerns       Comments Her husband has lung cancer and has diabetes on insulin- she will take care of him and check his BP, O2, and BG daily. She is currently sleeping well and reports no problems - bedtime 930-10pm and wakes up at 130am for the bathroom (no problems going back to sleep). She likes to garden and stay active to reduce stress - there is too much to be done (doesn't have much time for relaxation). She will do deep breathing (in 5 seconds and out 7 seconds). She finds support and comfort in reading her bible in the morning and her faith. She feels like there is no one she can rely on for support. Mazi began to get emotional when talking about this- discussed possibly finding a counselor or therapist who she can find emotional support from.       Expected Outcomes ST: reach out to pcp for counselor or therapist, continue relaxation techniques LT: find support system       Interventions Encouraged to attend Cardiac Rehabilitation for the exercise       Continue Psychosocial Services  Follow up required by staff               Initial Review     Source of Stress Concerns Family               Psychosocial Discharge (Final Psychosocial Re-Evaluation):  Psychosocial Re-Evaluation - 06/18/21 1109       Psychosocial Re-Evaluation   Current issues with Current  Stress Concerns    Comments Her husband has lung cancer and has diabetes on insulin- she will take care of him and check his BP, O2, and BG daily. She is currently sleeping well and reports no problems - bedtime 930-10pm and wakes up at 130am for the bathroom (no problems going back to sleep). She likes to garden and stay active to reduce stress - there is too much to be done (doesn't have much time for relaxation). She will do deep  breathing (in 5 seconds and out 7 seconds). She finds support and comfort in reading her bible in the morning and her faith. She feels like there is no one she can rely on for support. Mckynleigh began to get emotional when talking about this- discussed possibly finding a counselor or therapist who she can find emotional support from.    Expected Outcomes ST: reach out to pcp for counselor or therapist, continue relaxation techniques LT: find support system    Interventions Encouraged to attend Cardiac Rehabilitation for the exercise    Continue Psychosocial Services  Follow up required by staff      Initial Review   Source of Stress Concerns Family             Vocational Rehabilitation: Provide vocational rehab assistance to qualifying candidates.   Vocational Rehab Evaluation & Intervention:  Vocational Rehab - 05/02/21 1409       Initial Vocational Rehab Evaluation & Intervention   Assessment shows need for Vocational Rehabilitation No             Education: Education Goals: Education classes will be provided on a variety of topics geared toward better understanding of heart health and risk factor modification. Participant will state understanding/return demonstration of topics presented as noted by education test scores.  Learning Barriers/Preferences:  Learning Barriers/Preferences - 05/02/21 1409       Learning Barriers/Preferences   Learning Barriers None    Learning Preferences None             General Cardiac Education  Topics:  AED/CPR: - Group verbal and written instruction with the use of models to demonstrate the basic use of the AED with the basic ABC's of resuscitation.   Anatomy and Cardiac Procedures: - Group verbal and visual presentation and models provide information about basic cardiac anatomy and function. Reviews the testing methods done to diagnose heart disease and the outcomes of the test results. Describes the treatment choices: Medical Management, Angioplasty, or Coronary Bypass Surgery for treating various heart conditions including Myocardial Infarction, Angina, Valve Disease, and Cardiac Arrhythmias.  Written material given at graduation. Flowsheet Row Cardiac Rehab from 06/18/2021 in [facility] Cardiac and Pulmonary Rehab  Date 05/14/21  Educator SB  Instruction Review Code 1- Verbalizes Understanding       Medication Safety: - Group verbal and visual instruction to review commonly prescribed medications for heart and lung disease. Reviews the medication, class of the drug, and side effects. Includes the steps to properly store meds and maintain the prescription regimen.  Written material given at graduation. Flowsheet Row Cardiac Rehab from 06/18/2021 in [facility] Cardiac and Pulmonary Rehab  Date 06/04/21  Educator SB  Instruction Review Code 1- Verbalizes Understanding       Intimacy: - Group verbal instruction through game format to discuss how heart and lung disease can affect sexual intimacy. Written material given at graduation..   Know Your Numbers and Heart Failure: - Group verbal and visual instruction to discuss disease risk factors for cardiac and pulmonary disease and treatment options.  Reviews associated critical values for Overweight/Obesity, Hypertension, Cholesterol, and Diabetes.  Discusses basics of heart failure: signs/symptoms and treatments.  Introduces Heart Failure Zone chart for action plan for heart failure.  Written material given at graduation. Flowsheet Row  Cardiac Rehab from 06/18/2021 in [facility] Cardiac and Pulmonary Rehab  Date 06/11/21  Educator [facility]  Instruction Review Code 1- Verbalizes Understanding       Infection Prevention: - Provides verbal and written  material to individual with discussion of infection control including proper hand washing and proper equipment cleaning during exercise session. Flowsheet Row Cardiac Rehab from 06/18/2021 in [facility] Cardiac and Pulmonary Rehab  Date 05/12/21  Educator [facility]  Instruction Review Code 1- Verbalizes Understanding       Falls Prevention: - Provides verbal and written material to individual with discussion of falls prevention and safety. Flowsheet Row Cardiac Rehab from 06/18/2021 in [facility] Cardiac and Pulmonary Rehab  Date 05/12/21  Educator [facility]  Instruction Review Code 1- Verbalizes Understanding       Other: -Provides group and verbal instruction on various topics (see comments)   Knowledge Questionnaire Score:  Knowledge Questionnaire Score - 05/12/21 1038       Knowledge Questionnaire Score   Pre Score 26/26             Core Components/Risk Factors/Patient Goals at Admission:  Personal Goals and Risk Factors at Admission - 05/12/21 1039       Core Components/Risk Factors/Patient Goals on Admission    Weight Management Yes;Weight Maintenance    Intervention Weight Management: Develop a combined nutrition and exercise program designed to reach desired caloric intake, while maintaining appropriate intake of nutrient and fiber, sodium and fats, and appropriate energy expenditure required for the weight goal.;Weight Management: Provide education and appropriate resources to help participant work on and attain dietary goals.    Admit Weight 104 lb 3.2 oz (47.3 kg)    Goal Weight: Short Term 104 lb (47.2 kg)    Goal Weight: Long Term 104 lb (47.2 kg)    Expected Outcomes Short Term: Continue to assess and modify interventions until short term weight is achieved;Long Term: Adherence  to nutrition and physical activity/exercise program aimed toward attainment of established weight goal;Weight Maintenance: Understanding of the daily nutrition guidelines, which includes 25-35% calories from fat, 7% or less cal from saturated fats, less than 253m cholesterol, less than 1.5gm of sodium, & 5 or more servings of fruits and vegetables daily    Hypertension Yes    Intervention Provide education on lifestyle modifcations including regular physical activity/exercise, weight management, moderate sodium restriction and increased consumption of fresh fruit, vegetables, and low fat dairy, alcohol moderation, and smoking cessation.;Monitor prescription use compliance.    Expected Outcomes Short Term: Continued assessment and intervention until BP is < 140/944mHG in hypertensive participants. < 130/8069mG in hypertensive participants with diabetes, heart failure or chronic kidney disease.;Long Term: Maintenance of blood pressure at goal levels.    Lipids Yes    Intervention Provide education and support for participant on nutrition & aerobic/resistive exercise along with prescribed medications to achieve LDL <59m31mDL >40mg1m Expected Outcomes Short Term: Participant states understanding of desired cholesterol values and is compliant with medications prescribed. Participant is following exercise prescription and nutrition guidelines.;Long Term: Cholesterol controlled with medications as prescribed, with individualized exercise RX and with personalized nutrition plan. Value goals: LDL < 59mg,48m > 40 mg.             Education:Diabetes - Individual verbal and written instruction to review signs/symptoms of diabetes, desired ranges of glucose level fasting, after meals and with exercise. Acknowledge that pre and post exercise glucose checks will be done for 3 sessions at entry of program.   Core Components/Risk Factors/Patient Goals Review:   Goals and Risk Factor Review     Row Name  06/18/21 1103             Core  Components/Risk Factors/Patient Goals Review   Personal Goals Review Lipids;Hypertension       Review 122/62 today, at home it will be around 120s/60s (she will check when she thinks about it or if she feels like it is off). She is still taking all of her medication as directed. She has not had recent bloodwork, but she stays active and eats heart healthy foods. She sees the cardiologist in September. She is having no issues with her medication and has been taking all of them as directed.       Expected Outcomes ST: check BP at home at least 2x/week LT: continue to manage risk factors.                Core Components/Risk Factors/Patient Goals at Discharge (Final Review):   Goals and Risk Factor Review - 06/18/21 1103       Core Components/Risk Factors/Patient Goals Review   Personal Goals Review Lipids;Hypertension    Review 122/62 today, at home it will be around 120s/60s (she will check when she thinks about it or if she feels like it is off). She is still taking all of her medication as directed. She has not had recent bloodwork, but she stays active and eats heart healthy foods. She sees the cardiologist in September. She is having no issues with her medication and has been taking all of them as directed.    Expected Outcomes ST: check BP at home at least 2x/week LT: continue to manage risk factors.             ITP Comments:  ITP Comments     Row Name 05/02/21 1414 05/12/21 1029 05/28/21 1119 06/25/21 0751     ITP Comments Initial telephone orientation completed. Diagnosis can be found in [facility] 6/2. EP orientation scheduled for Monday 6/27 at 8am. Completed 6MWT and gym orientation. Initial ITP created and sent for review to Dr. Emily Filbert, Medical Director. 30 Day review completed. Medical Director ITP review done, changes made as directed, and signed approval by Medical Director. 30 Day review completed. Medical Director ITP review done,  changes made as directed, and signed approval by Medical Director.             Comments:

## 2021-06-30 ENCOUNTER — Other Ambulatory Visit: Payer: Self-pay

## 2021-06-30 ENCOUNTER — Encounter: Payer: Medicare PPO | Admitting: *Deleted

## 2021-06-30 DIAGNOSIS — I214 Non-ST elevation (NSTEMI) myocardial infarction: Secondary | ICD-10-CM

## 2021-06-30 DIAGNOSIS — Z5189 Encounter for other specified aftercare: Secondary | ICD-10-CM | POA: Diagnosis not present

## 2021-06-30 NOTE — Progress Notes (Signed)
Daily Session Note  Patient Details  Name: Victoria [name] MRN: [id number] Date of Birth: 1946-11-14 Referring Provider:   Flowsheet Row Cardiac Rehab from 05/12/2021 in [facility] Cardiac and Pulmonary Rehab  Referring Provider Isaias Cowman MD       Encounter Date: 06/30/2021  Check In:  Session Check In - 06/30/21 1108       Check-In   Supervising physician immediately available to respond to emergencies See telemetry face sheet for immediately available ER MD    Location [facility]-Cardiac & Pulmonary Rehab    Staff Present Renita Papa, RN BSN;Joseph Tessie Fass, RCP,RRT,BSRT;Kelly [place], MPA, Mauricia Area, BS, ACSM CEP, Exercise Physiologist    Virtual Visit No    Medication changes reported     No    Fall or balance concerns reported    No    Warm-up and Cool-down Performed on first and last piece of equipment    Resistance Training Performed Yes    VAD Patient? No    PAD/SET Patient? No      Pain Assessment   Currently in Pain? No/denies                Social History   Tobacco Use  Smoking Status Never  Smokeless Tobacco Never    Goals Met:  Independence with exercise equipment Exercise tolerated well No report of cardiac concerns or symptoms Strength training completed today  Goals Unmet:  Not Applicable  Comments: Pt able to follow exercise prescription today without complaint.  Will continue to monitor for progression.    Dr. Emily Filbert is Medical Director for [place].  Dr. Ottie Glazier is Medical Director for [facility] Pulmonary Rehabilitation.

## 2021-07-02 ENCOUNTER — Other Ambulatory Visit: Payer: Self-pay

## 2021-07-02 DIAGNOSIS — Z5189 Encounter for other specified aftercare: Secondary | ICD-10-CM | POA: Diagnosis not present

## 2021-07-02 DIAGNOSIS — I214 Non-ST elevation (NSTEMI) myocardial infarction: Secondary | ICD-10-CM

## 2021-07-02 NOTE — Progress Notes (Signed)
Daily Session Note  Patient Details  Name: Victoria [name] MRN: [id number] Date of Birth: 1946/02/20 Referring Provider:   Flowsheet Row Cardiac Rehab from 05/12/2021 in [facility] Cardiac and Pulmonary Rehab  Referring Provider Isaias Cowman MD       Encounter Date: 07/02/2021  Check In:  Session Check In - 07/02/21 1036       Check-In   Supervising physician immediately available to respond to emergencies See telemetry face sheet for immediately available ER MD    Location [facility]-Cardiac & Pulmonary Rehab    Staff Present Birdie Sons, MPA, RN;Laureen Owens Shark, BS, RRT, CPFT;Joseph [place], [place]    Virtual Visit No    Medication changes reported     No    Fall or balance concerns reported    No    Warm-up and Cool-down Performed on first and last piece of equipment    Resistance Training Performed Yes    VAD Patient? No    PAD/SET Patient? No      Pain Assessment   Currently in Pain? No/denies                Social History   Tobacco Use  Smoking Status Never  Smokeless Tobacco Never    Goals Met:  Independence with exercise equipment Exercise tolerated well No report of cardiac concerns or symptoms Strength training completed today  Goals Unmet:  Not Applicable  Comments: Pt able to follow exercise prescription today without complaint.  Will continue to monitor for progression.    Dr. Emily Filbert is Medical Director for [place].  Dr. Ottie Glazier is Medical Director for [facility] Pulmonary Rehabilitation.

## 2021-07-04 ENCOUNTER — Other Ambulatory Visit: Payer: Self-pay

## 2021-07-04 ENCOUNTER — Encounter: Payer: Medicare PPO | Admitting: *Deleted

## 2021-07-04 DIAGNOSIS — I214 Non-ST elevation (NSTEMI) myocardial infarction: Secondary | ICD-10-CM

## 2021-07-04 DIAGNOSIS — Z5189 Encounter for other specified aftercare: Secondary | ICD-10-CM | POA: Diagnosis not present

## 2021-07-04 NOTE — Progress Notes (Signed)
Daily Session Note  Patient Details  Name: Victoria [name] MRN: [id number] Date of Birth: Apr 02, 1946 Referring Provider:   Flowsheet Row Cardiac Rehab from 05/12/2021 in [facility] Cardiac and Pulmonary Rehab  Referring Provider Isaias Cowman MD       Encounter Date: 07/04/2021  Check In:  Session Check In - 07/04/21 1102       Check-In   Supervising physician immediately available to respond to emergencies See telemetry face sheet for immediately available ER MD    Location [facility]-Cardiac & Pulmonary Rehab    Staff Present Renita Papa, RN BSN;Joseph Tessie Fass, RCP,RRT,BSRT;Kelly [place], MPA, RN    Virtual Visit No    Medication changes reported     No    Fall or balance concerns reported    No    Warm-up and Cool-down Performed on first and last piece of equipment    Resistance Training Performed Yes    VAD Patient? No    PAD/SET Patient? No      Pain Assessment   Currently in Pain? No/denies                Social History   Tobacco Use  Smoking Status Never  Smokeless Tobacco Never    Goals Met:  Independence with exercise equipment Exercise tolerated well No report of cardiac concerns or symptoms Strength training completed today  Goals Unmet:  Not Applicable  Comments: Pt able to follow exercise prescription today without complaint.  Will continue to monitor for progression.    Dr. Emily Filbert is Medical Director for [place].  Dr. Ottie Glazier is Medical Director for [facility] Pulmonary Rehabilitation.

## 2021-07-07 ENCOUNTER — Encounter: Payer: Medicare PPO | Admitting: *Deleted

## 2021-07-07 ENCOUNTER — Other Ambulatory Visit: Payer: Self-pay

## 2021-07-07 DIAGNOSIS — I214 Non-ST elevation (NSTEMI) myocardial infarction: Secondary | ICD-10-CM

## 2021-07-07 DIAGNOSIS — Z5189 Encounter for other specified aftercare: Secondary | ICD-10-CM | POA: Diagnosis not present

## 2021-07-07 NOTE — Progress Notes (Signed)
Daily Session Note  Patient Details  Name: Victoria [name] MRN: [id number] Date of Birth: 07/10/1946 Referring Provider:   Flowsheet Row Cardiac Rehab from 05/12/2021 in [facility] Cardiac and Pulmonary Rehab  Referring Provider Paraschos, Alexander MD       Encounter Date: 07/07/2021  Check In:  Session Check In - 07/07/21 1152       Check-In   Supervising physician immediately available to respond to emergencies See telemetry face sheet for immediately available ER MD    Location [facility]-Cardiac & Pulmonary Rehab    Staff Present Meredith Craven, RN BSN;Joseph [place], RCP,RRT,BSRT;Kelly Bollinger, MPA, RN;Kelly Hayes, BS, ACSM CEP, Exercise Physiologist    Virtual Visit No    Medication changes reported     No    Fall or balance concerns reported    No    Warm-up and Cool-down Performed on first and last piece of equipment    Resistance Training Performed Yes    VAD Patient? No    PAD/SET Patient? No      Pain Assessment   Currently in Pain? No/denies                Social History   Tobacco Use  Smoking Status Never  Smokeless Tobacco Never    Goals Met:  Independence with exercise equipment Exercise tolerated well No report of cardiac concerns or symptoms Strength training completed today  Goals Unmet:  Not Applicable  Comments: Pt able to follow exercise prescription today without complaint.  Will continue to monitor for progression.    Dr. Mark Miller is Medical Director for [facility] Cardiac Rehabilitation.  Dr. Fuad Aleskerov is Medical Director for [facility] Pulmonary Rehabilitation. 

## 2021-07-09 ENCOUNTER — Other Ambulatory Visit: Payer: Self-pay

## 2021-07-09 DIAGNOSIS — I214 Non-ST elevation (NSTEMI) myocardial infarction: Secondary | ICD-10-CM

## 2021-07-09 DIAGNOSIS — Z5189 Encounter for other specified aftercare: Secondary | ICD-10-CM | POA: Diagnosis not present

## 2021-07-09 NOTE — Progress Notes (Signed)
Daily Session Note  Patient Details  Name: Victoria [name] MRN: [id number] Date of Birth: 1946-09-08 Referring Provider:   Flowsheet Row Cardiac Rehab from 05/12/2021 in [facility] Cardiac and Pulmonary Rehab  Referring Provider Isaias Cowman MD       Encounter Date: 07/09/2021  Check In:  Session Check In - 07/09/21 1042       Check-In   Supervising physician immediately available to respond to emergencies See telemetry face sheet for immediately available ER MD    Location [facility]-Cardiac & Pulmonary Rehab    Staff Present Birdie Sons, MPA, Elveria Rising, BA, ACSM CEP, Exercise Physiologist;Joseph [place], RCP,RRT,BSRT;Melissa [place], [place], LDN    Virtual Visit No    Medication changes reported     No    Fall or balance concerns reported    No    Warm-up and Cool-down Performed on first and last piece of equipment    Resistance Training Performed Yes    VAD Patient? No    PAD/SET Patient? No      Pain Assessment   Currently in Pain? No/denies                Social History   Tobacco Use  Smoking Status Never  Smokeless Tobacco Never    Goals Met:  Independence with exercise equipment Exercise tolerated well No report of cardiac concerns or symptoms Strength training completed today  Goals Unmet:  Not Applicable  Comments: Pt able to follow exercise prescription today without complaint.  Will continue to monitor for progression.    Dr. Emily Filbert is Medical Director for [place].  Dr. Ottie Glazier is Medical Director for [facility] Pulmonary Rehabilitation.

## 2021-07-14 ENCOUNTER — Other Ambulatory Visit: Payer: Self-pay

## 2021-07-14 ENCOUNTER — Encounter: Payer: Medicare PPO | Admitting: *Deleted

## 2021-07-14 DIAGNOSIS — I214 Non-ST elevation (NSTEMI) myocardial infarction: Secondary | ICD-10-CM

## 2021-07-14 DIAGNOSIS — Z5189 Encounter for other specified aftercare: Secondary | ICD-10-CM | POA: Diagnosis not present

## 2021-07-14 NOTE — Progress Notes (Signed)
Daily Session Note  Patient Details  Name: Victoria [name] MRN: [id number] Date of Birth: Mar 14, 1946 Referring Provider:   Flowsheet Row Cardiac Rehab from 05/12/2021 in [facility] Cardiac and Pulmonary Rehab  Referring Provider Isaias Cowman MD       Encounter Date: 07/14/2021  Check In:  Session Check In - 07/14/21 1117       Check-In   Supervising physician immediately available to respond to emergencies See telemetry face sheet for immediately available ER MD    Location [facility]-Cardiac & Pulmonary Rehab    Staff Present Renita Papa, RN BSN;Joseph Tessie Fass, RCP,RRT,BSRT;Kelly [place], MPA, Mauricia Area, BS, ACSM CEP, Exercise Physiologist    Virtual Visit No    Medication changes reported     No    Fall or balance concerns reported    No    Warm-up and Cool-down Performed on first and last piece of equipment    Resistance Training Performed Yes    VAD Patient? No    PAD/SET Patient? No      Pain Assessment   Currently in Pain? No/denies                Social History   Tobacco Use  Smoking Status Never  Smokeless Tobacco Never    Goals Met:  Independence with exercise equipment Exercise tolerated well No report of concerns or symptoms today Strength training completed today  Goals Unmet:  Not Applicable  Comments: Pt able to follow exercise prescription today without complaint.  Will continue to monitor for progression.    Dr. Emily Filbert is Medical Director for [place].  Dr. Ottie Glazier is Medical Director for [facility] Pulmonary Rehabilitation.

## 2021-07-16 ENCOUNTER — Other Ambulatory Visit: Payer: Self-pay

## 2021-07-16 VITALS — Ht 58.4 in | Wt 108.2 lb

## 2021-07-16 DIAGNOSIS — Z5189 Encounter for other specified aftercare: Secondary | ICD-10-CM | POA: Diagnosis not present

## 2021-07-16 DIAGNOSIS — I214 Non-ST elevation (NSTEMI) myocardial infarction: Secondary | ICD-10-CM

## 2021-07-16 NOTE — Progress Notes (Signed)
Daily Session Note  Patient Details  Name: Victoria [name] MRN: [id number] Date of Birth: Oct 14, 1946 Referring Provider:   Flowsheet Row Cardiac Rehab from 05/12/2021 in [facility] Cardiac and Pulmonary Rehab  Referring Provider Isaias Cowman MD       Encounter Date: 07/16/2021  Check In:  Session Check In - 07/16/21 1115       Check-In   Supervising physician immediately available to respond to emergencies See telemetry face sheet for immediately available ER MD    Location [facility]-Cardiac & Pulmonary Rehab    Staff Present Birdie Sons, MPA, RN;Melissa Osmond, RDN, LDN;Jessica [place]land, MA, RCEP, CCRP, CCET;Amanda Sommer, BA, ACSM CEP, Exercise Physiologist;Joseph [place], [place]    Virtual Visit No    Medication changes reported     No    Fall or balance concerns reported    No    Warm-up and Cool-down Performed on first and last piece of equipment    Resistance Training Performed Yes    VAD Patient? No    PAD/SET Patient? No      Pain Assessment   Currently in Pain? No/denies                Social History   Tobacco Use  Smoking Status Never  Smokeless Tobacco Never    Goals Met:  Independence with exercise equipment Exercise tolerated well No report of concerns or symptoms today Strength training completed today  Goals Unmet:  Not Applicable  Comments: Pt able to follow exercise prescription today without complaint.  Will continue to monitor for progression.  [place] Name 05/12/21 1030 07/16/21 1138       6 Minute Walk   Phase Initial Discharge    Distance 1555 feet 1600 feet    Distance % Change -- 2.8 %    Distance Feet Change -- 45 ft    Walk Time 6 minutes 6 minutes    # of Rest Breaks 0 0    MPH 2.95 3    METS 3.26 3.3    RPE 9 11    Perceived Dyspnea  -- 0    VO2 Peak 11.39 11.64    Symptoms No No    Resting HR 65 bpm 75 bpm    Resting BP 134/62 124/64    Resting Oxygen Saturation  98 % 97 %    Exercise Oxygen  Saturation  during 6 min walk 97 % --    Max Ex. HR 99 bpm 106 bpm    Max Ex. BP 146/74 140/66    2 Minute Post BP 126/62 --               Dr. Emily Filbert is Medical Director for [place].  Dr. Ottie Glazier is Medical Director for [facility] Pulmonary Rehabilitation.

## 2021-07-18 ENCOUNTER — Encounter: Payer: Medicare PPO | Attending: Cardiology | Admitting: *Deleted

## 2021-07-18 ENCOUNTER — Other Ambulatory Visit: Payer: Self-pay

## 2021-07-18 DIAGNOSIS — I214 Non-ST elevation (NSTEMI) myocardial infarction: Secondary | ICD-10-CM | POA: Insufficient documentation

## 2021-07-18 NOTE — Progress Notes (Signed)
Daily Session Note  Patient Details  Name: Victoria [name] MRN: [id number] Date of Birth: 11-Jan-1946 Referring Provider:   Flowsheet Row Cardiac Rehab from 05/12/2021 in [facility] Cardiac and Pulmonary Rehab  Referring Provider Isaias Cowman MD       Encounter Date: 07/18/2021  Check In:  Session Check In - 07/18/21 1109       Check-In   Supervising physician immediately available to respond to emergencies See telemetry face sheet for immediately available ER MD    Location [facility]-Cardiac & Pulmonary Rehab    Staff Present Renita Papa, RN BSN;Joseph [place], RCP,RRT,BSRT;Jessica [place], [place], RCEP, CCRP, CCET    Virtual Visit No    Medication changes reported     No    Fall or balance concerns reported    No    Warm-up and Cool-down Performed on first and last piece of equipment    Resistance Training Performed Yes    VAD Patient? No    PAD/SET Patient? No      Pain Assessment   Currently in Pain? No/denies                Social History   Tobacco Use  Smoking Status Never  Smokeless Tobacco Never    Goals Met:  Independence with exercise equipment Exercise tolerated well No report of concerns or symptoms today Strength training completed today  Goals Unmet:  Not Applicable  Comments: Pt able to follow exercise prescription today without complaint.  Will continue to monitor for progression.    Dr. Emily Filbert is Medical Director for [facility].  Dr. Ottie Glazier is Medical Director for [facility] Pulmonary Rehabilitation.

## 2021-07-23 ENCOUNTER — Encounter: Payer: Self-pay | Admitting: *Deleted

## 2021-07-23 ENCOUNTER — Encounter: Payer: Medicare PPO | Admitting: *Deleted

## 2021-07-23 ENCOUNTER — Other Ambulatory Visit: Payer: Self-pay

## 2021-07-23 DIAGNOSIS — I214 Non-ST elevation (NSTEMI) myocardial infarction: Secondary | ICD-10-CM

## 2021-07-23 NOTE — Progress Notes (Signed)
Daily Session Note  Patient Details  Name: Victoria [name] MRN: [id number] Date of Birth: January 29, 1946 Referring Provider:   Flowsheet Row Cardiac Rehab from 05/12/2021 in [facility] Cardiac and Pulmonary Rehab  Referring Provider Isaias Cowman MD       Encounter Date: 07/23/2021  Check In:  Session Check In - 07/23/21 1125       Check-In   Supervising physician immediately available to respond to emergencies See telemetry face sheet for immediately available ER MD    Location [facility]-Cardiac & Pulmonary Rehab    Staff Present Renita Papa, RN BSN;Joseph [place], RCP,RRT,BSRT;Jessica [place], [place], RCEP, CCRP, CCET    Virtual Visit No    Medication changes reported     No    Fall or balance concerns reported    No    Warm-up and Cool-down Performed on first and last piece of equipment    Resistance Training Performed Yes    VAD Patient? No    PAD/SET Patient? No      Pain Assessment   Currently in Pain? No/denies                Social History   Tobacco Use  Smoking Status Never  Smokeless Tobacco Never    Goals Met:  Independence with exercise equipment Exercise tolerated well No report of concerns or symptoms today Strength training completed today  Goals Unmet:  Not Applicable  Comments: Pt able to follow exercise prescription today without complaint.  Will continue to monitor for progression.    Dr. Emily Filbert is Medical Director for [facility].  Dr. Ottie Glazier is Medical Director for [facility] Pulmonary Rehabilitation.

## 2021-07-23 NOTE — Progress Notes (Signed)
Cardiac Individual Treatment Plan  Patient Details  Name: DESHANTI ADCOX MRN: [id number] Date of Birth: May 15, 1946 Referring Provider:   Flowsheet Row Cardiac Rehab from 05/12/2021 in [facility] Cardiac and Pulmonary Rehab  Referring Provider Isaias Cowman MD       Initial Encounter Date:  Flowsheet Row Cardiac Rehab from 05/12/2021 in [facility] Cardiac and Pulmonary Rehab  Date 05/12/21       Visit Diagnosis: NSTEMI (non-ST elevated myocardial infarction) [facility])  Patient's Home Medications on Admission:  Current Outpatient Medications:    apixaban (ELIQUIS) 5 MG TABS tablet, Take 1 tablet (5 mg total) by mouth 2 (two) times daily., Disp: 60 tablet, Rfl: 2   atorvastatin (LIPITOR) 80 MG tablet, Take 1 tablet (80 mg total) by mouth daily., Disp: 30 tablet, Rfl: 1   b complex vitamins capsule, Take 1 capsule by mouth daily. (Patient not taking: No sig reported), Disp: , Rfl:    Cholecalciferol 25 MCG (1000 UT) tablet, Take 1 tablet by mouth daily., Disp: , Rfl:    clopidogrel (PLAVIX) 75 MG tablet, Take 1 tablet (75 mg total) by mouth daily with breakfast., Disp: 30 tablet, Rfl: 1   losartan (COZAAR) 50 MG tablet, Take 1 tablet (50 mg total) by mouth daily., Disp: 30 tablet, Rfl: 1   metoprolol tartrate (LOPRESSOR) 25 MG tablet, Take 1 tablet (25 mg total) by mouth 2 (two) times daily., Disp: 60 tablet, Rfl: 1   Multiple Vitamin (MULTI-VITAMIN) tablet, Take 1 tablet by mouth daily., Disp: , Rfl:   Past Medical History: Past Medical History:  Diagnosis Date   Arthritis    Headache    Hyperlipidemia    Hypertension     Tobacco Use: Social History   Tobacco Use  Smoking Status Never  Smokeless Tobacco Never    Labs: Recent Review Flowsheet Data     Labs for ITP Cardiac and Pulmonary Rehab Latest Ref Rng & Units 03/19/2021   Cholestrol 0 - 200 mg/dL 145   LDLCALC 0 - 99 mg/dL 82   HDL >40 mg/dL 44   Trlycerides <150 mg/dL 96   Hemoglobin A1c 4.8 - 5.6 % 5.7(H)         Exercise Target Goals: Exercise Program Goal: Individual exercise prescription set using results from initial 6 min walk test and THRR while considering  patient's activity barriers and safety.   Exercise Prescription Goal: Initial exercise prescription builds to 30-45 minutes a day of aerobic activity, 2-3 days per week.  Home exercise guidelines will be given to patient during program as part of exercise prescription that the participant will acknowledge.   Education: Aerobic Exercise: - Group verbal and visual presentation on the components of exercise prescription. Introduces F.I.T.T principle from ACSM for exercise prescriptions.  Reviews F.I.T.T. principles of aerobic exercise including progression. Written material given at graduation. Flowsheet Row Cardiac Rehab from 07/16/2021 in [facility] Cardiac and Pulmonary Rehab  Date 07/09/21  Educator [facility]  Instruction Review Code 1- Verbalizes Understanding       Education: Resistance Exercise: - Group verbal and visual presentation on the components of exercise prescription. Introduces F.I.T.T principle from ACSM for exercise prescriptions  Reviews F.I.T.T. principles of resistance exercise including progression. Written material given at graduation. Flowsheet Row Cardiac Rehab from 07/16/2021 in [facility] Cardiac and Pulmonary Rehab  Date 07/16/21  Educator [facility]  Instruction Review Code 1- Verbalizes Understanding        Education: Exercise & Equipment Safety: - Individual verbal instruction and demonstration of equipment use and  safety with use of the equipment. Flowsheet Row Cardiac Rehab from 07/16/2021 in [facility] Cardiac and Pulmonary Rehab  Date 05/12/21  Educator [facility]  Instruction Review Code 1- Verbalizes Understanding       Education: Exercise Physiology & General Exercise Guidelines: - Group verbal and written instruction with models to review the exercise physiology of the cardiovascular system and associated critical values.  Provides general exercise guidelines with specific guidelines to those with heart or lung disease.  Flowsheet Row Cardiac Rehab from 07/16/2021 in [facility] Cardiac and Pulmonary Rehab  Date 07/02/21  Educator [facility]  Instruction Review Code 1- Verbalizes Understanding       Education: Flexibility, Balance, Mind/Body Relaxation: - Group verbal and visual presentation with interactive activity on the components of exercise prescription. Introduces F.I.T.T principle from ACSM for exercise prescriptions. Reviews F.I.T.T. principles of flexibility and balance exercise training including progression. Also discusses the mind body connection.  Reviews various relaxation techniques to help reduce and manage stress (i.e. Deep breathing, progressive muscle relaxation, and visualization). Balance handout provided to take home. Written material given at graduation. Flowsheet Row Cardiac Rehab from 07/16/2021 in [facility] Cardiac and Pulmonary Rehab  Date 05/21/21  Educator [facility]  Instruction Review Code 1- Verbalizes Understanding       Activity Barriers & Risk Stratification:  Activity Barriers & Cardiac Risk Stratification - 05/12/21 1034       Activity Barriers & Cardiac Risk Stratification   Activity Barriers None    Cardiac Risk Stratification High             6 Minute Walk:  6 Minute Walk     Row Name 05/12/21 1030 07/16/21 1138       6 Minute Walk   Phase Initial Discharge    Distance 1555 feet 1600 feet    Distance % Change -- 2.8 %    Distance Feet Change -- 45 ft    Walk Time 6 minutes 6 minutes    # of Rest Breaks 0 0    MPH 2.95 3    METS 3.26 3.3    RPE 9 11    Perceived Dyspnea  -- 0    VO2 Peak 11.39 11.64    Symptoms No No    Resting HR 65 bpm 75 bpm    Resting BP 134/62 124/64    Resting Oxygen Saturation  98 % 97 %    Exercise Oxygen Saturation  during 6 min walk 97 % --    Max Ex. HR 99 bpm 106 bpm    Max Ex. BP 146/74 140/66    2 Minute Post BP 126/62 --              Oxygen Initial Assessment:   Oxygen Re-Evaluation:   Oxygen Discharge (Final Oxygen Re-Evaluation):   Initial Exercise Prescription:  Initial Exercise Prescription - 05/12/21 1000       Date of Initial Exercise RX and Referring Provider   Date 05/12/21    Referring Provider Isaias Cowman MD      Treadmill   MPH 2.9    Grade 0.5    Minutes 15    METs 3.42      REL-XR   Level 1    Speed 50    Minutes 15    METs 3      Biostep-RELP   Level 2    SPM 50    Minutes 15    METs 3      Track   Laps  41    Minutes 15    METs 3.22      Prescription Details   Frequency (times per week) 3    Duration Progress to 30 minutes of continuous aerobic without signs/symptoms of physical distress      Intensity   THRR 40-80% of Max Heartrate 97-129    Ratings of Perceived Exertion 11-13    Perceived Dyspnea 0-4      Progression   Progression Continue to progress workloads to maintain intensity without signs/symptoms of physical distress.      Resistance Training   Training Prescription Yes    Weight 3 lb    Reps 10-15             Perform Capillary Blood Glucose checks as needed.  Exercise Prescription Changes:   Exercise Prescription Changes     Row Name 05/12/21 1000 05/26/21 0900 06/09/21 1500 06/18/21 1100 06/23/21 1500     Response to Exercise   Blood Pressure (Admit) 134/62 134/62 112/62 -- 132/70   Blood Pressure (Exercise) 146/74 124/70 -- -- --   Blood Pressure (Exit) 126/62 124/66 106/60 -- 112/60   Heart Rate (Admit) 65 bpm 64 bpm 60 bpm -- 72 bpm   Heart Rate (Exercise) 99 bpm 105 bpm 103 bpm -- 119 bpm   Heart Rate (Exit) 67 bpm 92 bpm 76 bpm -- 107 bpm   Oxygen Saturation (Admit) 98 % -- -- -- --   Oxygen Saturation (Exercise) 97 % -- -- -- --   Rating of Perceived Exertion (Exercise) _0 -- 13   Symptoms none none none -- none   Comments walk test results first full day of exercise -- -- --   Duration -- Continue with 30 min  of aerobic exercise without signs/symptoms of physical distress. Continue with 30 min of aerobic exercise without signs/symptoms of physical distress. -- Continue with 30 min of aerobic exercise without signs/symptoms of physical distress.   Intensity -- THRR unchanged THRR unchanged -- THRR unchanged     Progression   Progression -- Continue to progress workloads to maintain intensity without signs/symptoms of physical distress. Continue to progress workloads to maintain intensity without signs/symptoms of physical distress. -- Continue to progress workloads to maintain intensity without signs/symptoms of physical distress.   Average METs -- 4.24 4.43 -- 5.36     Resistance Training   Training Prescription -- Yes Yes -- Yes   Weight -- 3 lb 3 lb -- 3 lb   Reps -- 10-15 10-15 -- 10-15     Interval Training   Interval Training -- -- No -- No     Treadmill   MPH -- 2.9 3.2 -- 2.9   Grade -- 3 6.5 -- 8.5   Minutes -- 15 15 -- 15   METs -- 4.42 6.3 -- 6.62     REL-XR   Level -- 5 6 -- --   Minutes -- 15 15 -- --   METs -- 5.2 2.9 -- --     Biostep-RELP   Level -- -- 2 -- --   Minutes -- -- 15 -- --     Track   Laps -- 38 40 -- --   Minutes -- 15 15 -- --   METs -- 3.07 3.18 -- --     Home Exercise Plan   Plans to continue exercise at -- -- -- Home (comment)  walking, staff videos Home (comment)  walking, staff videos   Frequency -- -- --  Add 2 additional days to program exercise sessions. Add 2 additional days to program exercise sessions.   Initial Home Exercises Provided -- -- -- 06/18/21 06/18/21    Row Name 07/08/21 1500             Response to Exercise   Blood Pressure (Admit) 118/64       Blood Pressure (Exit) 110/54       Heart Rate (Admit) 63 bpm       Heart Rate (Exercise) 117 bpm       Heart Rate (Exit) 106 bpm       Rating of Perceived Exertion (Exercise) 11       Symptoms none       Duration Continue with 30 min of aerobic exercise without signs/symptoms  of physical distress.       Intensity THRR unchanged               Progression   Progression Continue to progress workloads to maintain intensity without signs/symptoms of physical distress.       Average METs 4.97               Resistance Training   Training Prescription Yes       Weight 3 lb       Reps 10-15               Interval Training   Interval Training No               Treadmill   MPH 3.1       Grade 8       Minutes 15       METs 6.79               REL-XR   Level 6       Minutes 15       METs 3.7               Biostep-RELP   Level 4       Minutes 15       METs 3               Track   Laps 40       Minutes 15       METs 3.18               Home Exercise Plan   Plans to continue exercise at Home (comment)  walking, staff videos       Frequency Add 2 additional days to program exercise sessions.       Initial Home Exercises Provided 06/18/21                Exercise Comments:   Exercise Goals and Review:   Exercise Goals     Row Name 05/12/21 1037             Exercise Goals   Increase Physical Activity Yes       Intervention Provide advice, education, support and counseling about physical activity/exercise needs.;Develop an individualized exercise prescription for aerobic and resistive training based on initial evaluation findings, risk stratification, comorbidities and participant's personal goals.       Expected Outcomes Short Term: Attend rehab on a regular basis to increase amount of physical activity.;Long Term: Add in home exercise to make exercise part of routine and to increase amount of physical activity.;Long Term: Exercising regularly at least 3-5 days a week.       Increase Strength and  Stamina Yes       Intervention Provide advice, education, support and counseling about physical activity/exercise needs.;Develop an individualized exercise prescription for aerobic and resistive training based on initial evaluation findings, risk  stratification, comorbidities and participant's personal goals.       Expected Outcomes Short Term: Increase workloads from initial exercise prescription for resistance, speed, and METs.;Short Term: Perform resistance training exercises routinely during rehab and add in resistance training at home;Long Term: Improve cardiorespiratory fitness, muscular endurance and strength as measured by increased METs and functional capacity (6MWT)       Able to understand and use rate of perceived exertion (RPE) scale Yes       Intervention Provide education and explanation on how to use RPE scale       Expected Outcomes Short Term: Able to use RPE daily in rehab to express subjective intensity level;Long Term:  Able to use RPE to guide intensity level when exercising independently       Able to understand and use Dyspnea scale Yes       Intervention Provide education and explanation on how to use Dyspnea scale       Expected Outcomes Short Term: Able to use Dyspnea scale daily in rehab to express subjective sense of shortness of breath during exertion;Long Term: Able to use Dyspnea scale to guide intensity level when exercising independently       Knowledge and understanding of Target Heart Rate Range (THRR) Yes       Intervention Provide education and explanation of THRR including how the numbers were predicted and where they are located for reference       Expected Outcomes Short Term: Able to state/look up THRR;Short Term: Able to use daily as guideline for intensity in rehab;Long Term: Able to use THRR to govern intensity when exercising independently       Able to check pulse independently Yes       Intervention Provide education and demonstration on how to check pulse in carotid and radial arteries.;Review the importance of being able to check your own pulse for safety during independent exercise       Expected Outcomes Short Term: Able to explain why pulse checking is important during independent  exercise;Long Term: Able to check pulse independently and accurately       Understanding of Exercise Prescription Yes       Intervention Provide education, explanation, and written materials on patient's individual exercise prescription       Expected Outcomes Short Term: Able to explain program exercise prescription;Long Term: Able to explain home exercise prescription to exercise independently                Exercise Goals Re-Evaluation :  Exercise Goals Re-Evaluation     Row Name 05/26/21 1002 06/09/21 1515 06/18/21 1104 06/18/21 1136 06/23/21 1600     Exercise Goal Re-Evaluation   Exercise Goals Review Increase Physical Activity;Increase Strength and Stamina Increase Physical Activity;Increase Strength and Stamina;Understanding of Exercise Prescription Increase Physical Activity;Increase Strength and Stamina;Understanding of Exercise Prescription Increase Physical Activity;Increase Strength and Stamina;Understanding of Exercise Prescription;Able to understand and use rate of perceived exertion (RPE) scale;Able to understand and use Dyspnea scale;Knowledge and understanding of Target Heart Rate Range (THRR);Able to check pulse independently Increase Physical Activity;Increase Strength and Stamina   Comments Jahzara is doing great her first couple of weeks being at rehab. She has already increased her load on the XR to level 5 and increased incline on the treadmill to  3%. Will continue to monitor. Neetu is doing well in rehab.  She is up to 30 watts on the bike and 6.5 % incline on the treadmill.  We will conitnue to monitor her progress. Dariya reports being very active and gardening. She will walk 2 miles everyday - she has an exercise bike, but she feels that it is very hard. She will do 7000 steps per day. Reviewed home exercise with pt today.  Pt plans to continue walking and using staff videos at home for exercise.  Reviewed THR, pulse, RPE, sign and symptoms, pulse oximetery and when to call 911  or MD.  Also discussed weather considerations and indoor options.  Pt voiced understanding. Markeita attends consistently and reaches THR range.  Staff will encourage trying 4 lb for strength work.   Expected Outcomes Short: Continue to increase loads on machines and track Long: Continue to build overall strength and stamina Short: Try 4 lb hand weights Long: Conitnue to improve stamina. ST: EP to go over home exercise LT: Continue to improve stamina Short: Continue to add in walking at home Long: COntinue to improve stamina Short:  increase weight to 4lb Long: build overall stamina    Row Name 07/08/21 1537             Exercise Goal Re-Evaluation   Exercise Goals Review Increase Physical Activity;Increase Strength and Stamina       Comments Nora is doing well in rehab. She has reached level 4 on the Biostep and up to 6.79 METs on the treadmill! Will continue to monitor. She should be encouraged to increase her handweights given her average MET level       Expected Outcomes Short: Increase handweights Long: Increase overall MET level                Discharge Exercise Prescription (Final Exercise Prescription Changes):  Exercise Prescription Changes - 07/08/21 1500       Response to Exercise   Blood Pressure (Admit) 118/64    Blood Pressure (Exit) 110/54    Heart Rate (Admit) 63 bpm    Heart Rate (Exercise) 117 bpm    Heart Rate (Exit) 106 bpm    Rating of Perceived Exertion (Exercise) 11    Symptoms none    Duration Continue with 30 min of aerobic exercise without signs/symptoms of physical distress.    Intensity THRR unchanged      Progression   Progression Continue to progress workloads to maintain intensity without signs/symptoms of physical distress.    Average METs 4.97      Resistance Training   Training Prescription Yes    Weight 3 lb    Reps 10-15      Interval Training   Interval Training No      Treadmill   MPH 3.1    Grade 8    Minutes 15    METs 6.79       REL-XR   Level 6    Minutes 15    METs 3.7      Biostep-RELP   Level 4    Minutes 15    METs 3      Track   Laps 40    Minutes 15    METs 3.18      Home Exercise Plan   Plans to continue exercise at Home (comment)   walking, staff videos   Frequency Add 2 additional days to program exercise sessions.    Initial Home Exercises Provided 06/18/21  Nutrition:  Target Goals: Understanding of nutrition guidelines, daily intake of sodium '1500mg'$ , cholesterol '200mg'$ , calories 30% from fat and 7% or less from saturated fats, daily to have 5 or more servings of fruits and vegetables.  Education: All About Nutrition: -Group instruction provided by verbal, written material, interactive activities, discussions, models, and posters to present general guidelines for heart healthy nutrition including fat, fiber, MyPlate, the role of sodium in heart healthy nutrition, utilization of the nutrition label, and utilization of this knowledge for meal planning. Follow up email sent as well. Written material given at graduation. Flowsheet Row Cardiac Rehab from 07/16/2021 in [facility] Cardiac and Pulmonary Rehab  Date 05/28/21  Educator [facility]  Instruction Review Code 1- Verbalizes Understanding       Biometrics:  Pre Biometrics - 05/12/21 1037       Pre Biometrics   Height 4' 10.4" (1.483 m)    Weight 104 lb 3.2 oz (47.3 kg)    BMI (Calculated) 21.49    Single Leg Stand 30 seconds             Post Biometrics - 07/16/21 1139        Post  Biometrics   Height 4' 10.4" (1.483 m)    Weight 108 lb 3.2 oz (49.1 kg)    BMI (Calculated) 22.32    Single Leg Stand 30 seconds             Nutrition Therapy Plan and Nutrition Goals:  Nutrition Therapy & Goals - 06/16/21 1036       Nutrition Therapy   Diet Heart healthy, low Na    Drug/Food Interactions Statins/Certain Fruits    Protein (specify units) 50g    Fiber 25 grams    Whole Grain Foods 3 servings    Saturated Fats  12 max. grams    Fruits and Vegetables 8 servings/day    Sodium 1.5 grams      Personal Nutrition Goals   Nutrition Goal ST: add soymilk, try smoothies with avocados, penaut butter, and/or tofu to add protein and fat LT: meet calorie and protein needs, maintain/gain weight    Comments She tries not to snack and she has established meal times. She has a bread maker and she will add flaxseed and other grains to her bread. She always has cooked vegetables on hand. early B: coffee (black), scrambled egg with tomato and 1 slice of toast (whole wheat) or oatmeal with walnuts with bananas berries. L: soup (vegetables - variety: "everything but the kitchen sink" - beans as well) - she enjoys the broth. About 3/4 cups of soup with water. Drinks: hot green tea and water. D: she will have 3-4 oz of meat if she eats it- usually just vegetables (beans, non starchy vegetables, and starchy vegetables) She will use olive oil or occasionally margarine to cook her foods. S: yogurt with walnuts (whatever is on sale). She does not feel hunger during the day and feels satiated with her meals. She was 140lbs 3 years ago, went to 120lbs 1 year ago, now 105lbs. Discussed weight maintenance/gain and ensure kcal and protein needs are met. Reviewed heart healthy eating. She would like to try smoothies. She is open to trying soymilk and making smoothies with tofu, peanut butter, and avocados.      Intervention Plan   Intervention Prescribe, educate and counsel regarding individualized specific dietary modifications aiming towards targeted core components such as weight, hypertension, lipid management, diabetes, heart failure and other comorbidities.    Expected  Outcomes Short Term Goal: Understand basic principles of dietary content, such as calories, fat, sodium, cholesterol and nutrients.;Short Term Goal: A plan has been developed with personal nutrition goals set during dietitian appointment.;Long Term Goal: Adherence to  prescribed nutrition plan.             Nutrition Assessments:  MEDIFICTS Score Key: ?70 Need to make dietary changes  40-70 Heart Healthy Diet ? 40 Therapeutic Level Cholesterol Diet  Flowsheet Row Cardiac Rehab from 05/12/2021 in [facility] Cardiac and Pulmonary Rehab  Picture Your Plate Total Score on Admission 81      Picture Your Plate Scores: <64 Unhealthy dietary pattern with much room for improvement. 41-50 Dietary pattern unlikely to meet recommendations for good health and room for improvement. 51-60 More healthful dietary pattern, with some room for improvement.  >60 Healthy dietary pattern, although there may be some specific behaviors that could be improved.    Nutrition Goals Re-Evaluation:   Nutrition Goals Discharge (Final Nutrition Goals Re-Evaluation):   Psychosocial: Target Goals: Acknowledge presence or absence of significant depression and/or stress, maximize coping skills, provide positive support system. Participant is able to verbalize types and ability to use techniques and skills needed for reducing stress and depression.   Education: Stress, Anxiety, and Depression - Group verbal and visual presentation to define topics covered.  Reviews how body is impacted by stress, anxiety, and depression.  Also discusses healthy ways to reduce stress and to treat/manage anxiety and depression.  Written material given at graduation.   Education: Sleep Hygiene -Provides group verbal and written instruction about how sleep can affect your health.  Define sleep hygiene, discuss sleep cycles and impact of sleep habits. Review good sleep hygiene tips.    Initial Review & Psychosocial Screening:  Initial Psych Review & Screening - 05/02/21 1409       Initial Review   Current issues with None Identified      Family Dynamics   Good Support System? Yes      Barriers   Psychosocial barriers to participate in program There are no identifiable barriers or psychosocial  needs.;The patient should benefit from training in stress management and relaxation.      Screening Interventions   Interventions Provide feedback about the scores to participant;Encouraged to exercise;To provide support and resources with identified psychosocial needs    Expected Outcomes Short Term goal: Utilizing psychosocial counselor, staff and physician to assist with identification of specific Stressors or current issues interfering with healing process. Setting desired goal for each stressor or current issue identified.;Long Term Goal: Stressors or current issues are controlled or eliminated.;Short Term goal: Identification and review with participant of any Quality of Life or Depression concerns found by scoring the questionnaire.;Long Term goal: The participant improves quality of Life and PHQ9 Scores as seen by post scores and/or verbalization of changes             Quality of Life Scores:   Quality of Life - 05/12/21 1038       Quality of Life   Select Quality of Life      Quality of Life Scores   Health/Function Pre 26.7 %    Socioeconomic Pre 25.36 %    Psych/Spiritual Pre 24.93 %    Family Pre 16.4 %    GLOBAL Pre 24.54 %            Scores of 19 and below usually indicate a poorer quality of life in these areas.  A difference of  2-3 points is a clinically meaningful difference.  A difference of 2-3 points in the total score of the Quality of Life Index has been associated with significant improvement in overall quality of life, self-image, physical symptoms, and general health in studies assessing change in quality of life.  PHQ-9: Recent Review Flowsheet Data     Depression screen [facility] 2/9 05/12/2021   Decreased Interest 0   Down, Depressed, Hopeless 0   PHQ - 2 Score 0   Altered sleeping 0   Tired, decreased energy 0   Change in appetite 0   Feeling bad or failure about yourself  0   Trouble concentrating 0   Moving slowly or fidgety/restless 0   Suicidal  thoughts 0   PHQ-9 Score 0   Difficult doing work/chores Not difficult at all      Interpretation of Total Score  Total Score Depression Severity:  1-4 = Minimal depression, 5-9 = Mild depression, 10-14 = Moderate depression, 15-19 = Moderately severe depression, 20-27 = Severe depression   Psychosocial Evaluation and Intervention:  Psychosocial Evaluation - 05/02/21 1430       Psychosocial Evaluation & Interventions   Comments 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 She nevery experienced any pain and is glad her heart had naturally compensated for the lack of blood flow by building collaterals. 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 She is happy to report that she doesn't notice any deficits from the stroke. Prior to covid [facility] and is looking at getting back to an active lifestyle. She is very involved in her family's lives and enjoys her family time.    Expected Outcomes Short: attend cardiac rehab for education and exercise. Long: develop and maintain positive self care habits.    Continue Psychosocial Services  Follow up required by staff             Psychosocial Re-Evaluation:  Psychosocial Re-Evaluation     [place] Name 06/18/21 1109 07/14/21 1120           Psychosocial Re-Evaluation   Current issues with Current Stress Concerns Current Stress Concerns      Comments Her husband has lung cancer and has diabetes on insulin- she will take care of him and check his BP, O2, and BG daily. She is currently sleeping well and reports no problems - bedtime 930-10pm and wakes up at 130am for the bathroom (no problems going back to sleep). She likes to garden and stay active to reduce stress - there is too much to be done (doesn't have much time for relaxation). She will do deep breathing (in 5 seconds and out 7 seconds). She finds support and comfort in reading her bible in the morning and her faith. She feels  like there is no one she can rely on for support. Doree began to get emotional when talking about this- discussed possibly finding a counselor or therapist who she can find emotional support from. Wynonia's lost her husband last week. She had prepared for this and reports doing well, but needing to get a lot done and taken care of. She is staying busy and walking a lot.      Expected Outcomes ST: reach out to pcp for counselor or therapist, continue relaxation techniques LT: find support system ST: reach out to pcp for counselor or therapist, continue relaxation techniques LT: find support system  Interventions Encouraged to attend Cardiac Rehabilitation for the exercise Encouraged to attend Cardiac Rehabilitation for the exercise      Continue Psychosocial Services  Follow up required by staff Follow up required by staff             Initial Review   Source of Stress Concerns Family Family               Psychosocial Discharge (Final Psychosocial Re-Evaluation):  Psychosocial Re-Evaluation - 07/14/21 1120       Psychosocial Re-Evaluation   Current issues with Current Stress Concerns    Comments Navada's lost her husband last week. She had prepared for this and reports doing well, but needing to get a lot done and taken care of. She is staying busy and walking a lot.    Expected Outcomes ST: reach out to pcp for counselor or therapist, continue relaxation techniques LT: find support system    Interventions Encouraged to attend Cardiac Rehabilitation for the exercise    Continue Psychosocial Services  Follow up required by staff      Initial Review   Source of Stress Concerns Family             Vocational Rehabilitation: Provide vocational rehab assistance to qualifying candidates.   Vocational Rehab Evaluation & Intervention:  Vocational Rehab - 05/02/21 1409       Initial Vocational Rehab Evaluation & Intervention   Assessment shows need for Vocational Rehabilitation No              Education: Education Goals: Education classes will be provided on a variety of topics geared toward better understanding of heart health and risk factor modification. Participant will state understanding/return demonstration of topics presented as noted by education test scores.  Learning Barriers/Preferences:  Learning Barriers/Preferences - 05/02/21 1409       Learning Barriers/Preferences   Learning Barriers None    Learning Preferences None             General Cardiac Education Topics:  AED/CPR: - Group verbal and written instruction with the use of models to demonstrate the basic use of the AED with the basic ABC's of resuscitation.   Anatomy and Cardiac Procedures: - Group verbal and visual presentation and models provide information about basic cardiac anatomy and function. Reviews the testing methods done to diagnose heart disease and the outcomes of the test results. Describes the treatment choices: Medical Management, Angioplasty, or Coronary Bypass Surgery for treating various heart conditions including Myocardial Infarction, Angina, Valve Disease, and Cardiac Arrhythmias.  Written material given at graduation. Flowsheet Row Cardiac Rehab from 07/16/2021 in [facility] Cardiac and Pulmonary Rehab  Date 07/16/21  Educator [facility]  Instruction Review Code 1- Verbalizes Understanding       Medication Safety: - Group verbal and visual instruction to review commonly prescribed medications for heart and lung disease. Reviews the medication, class of the drug, and side effects. Includes the steps to properly store meds and maintain the prescription regimen.  Written material given at graduation. Flowsheet Row Cardiac Rehab from 07/16/2021 in [facility] Cardiac and Pulmonary Rehab  Date 06/04/21  Educator SB  Instruction Review Code 1- Verbalizes Understanding       Intimacy: - Group verbal instruction through game format to discuss how heart and lung disease can affect  sexual intimacy. Written material given at graduation.[place] Medal Row Cardiac Rehab from 07/16/2021 in [facility] Cardiac and Pulmonary Rehab  Date 07/09/21  Educator [facility]  Instruction Review Code 1- Verbalizes Understanding  Know Your Numbers and Heart Failure: - Group verbal and visual instruction to discuss disease risk factors for cardiac and pulmonary disease and treatment options.  Reviews associated critical values for Overweight/Obesity, Hypertension, Cholesterol, and Diabetes.  Discusses basics of heart failure: signs/symptoms and treatments.  Introduces Heart Failure Zone chart for action plan for heart failure.  Written material given at graduation. Flowsheet Row Cardiac Rehab from 07/16/2021 in [facility] Cardiac and Pulmonary Rehab  Date 06/11/21  Educator [facility]  Instruction Review Code 1- Verbalizes Understanding       Infection Prevention: - Provides verbal and written material to individual with discussion of infection control including proper hand washing and proper equipment cleaning during exercise session. Flowsheet Row Cardiac Rehab from 07/16/2021 in [facility] Cardiac and Pulmonary Rehab  Date 05/12/21  Educator [facility]  Instruction Review Code 1- Verbalizes Understanding       Falls Prevention: - Provides verbal and written material to individual with discussion of falls prevention and safety. Flowsheet Row Cardiac Rehab from 07/16/2021 in [facility] Cardiac and Pulmonary Rehab  Date 05/12/21  Educator [facility]  Instruction Review Code 1- Verbalizes Understanding       Other: -Provides group and verbal instruction on various topics (see comments)   Knowledge Questionnaire Score:  Knowledge Questionnaire Score - 05/12/21 1038       Knowledge Questionnaire Score   Pre Score 26/26             Core Components/Risk Factors/Patient Goals at Admission:  Personal Goals and Risk Factors at Admission - 05/12/21 1039       Core Components/Risk Factors/Patient Goals on Admission     Weight Management Yes;Weight Maintenance    Intervention Weight Management: Develop a combined nutrition and exercise program designed to reach desired caloric intake, while maintaining appropriate intake of nutrient and fiber, sodium and fats, and appropriate energy expenditure required for the weight goal.;Weight Management: Provide education and appropriate resources to help participant work on and attain dietary goals.    Admit Weight 104 lb 3.2 oz (47.3 kg)    Goal Weight: Short Term 104 lb (47.2 kg)    Goal Weight: Long Term 104 lb (47.2 kg)    Expected Outcomes Short Term: Continue to assess and modify interventions until short term weight is achieved;Long Term: Adherence to nutrition and physical activity/exercise program aimed toward attainment of established weight goal;Weight Maintenance: Understanding of the daily nutrition guidelines, which includes 25-35% calories from fat, 7% or less cal from saturated fats, less than $RemoveB'200mg'DlyPZcnO$  cholesterol, less than 1.5gm of sodium, & 5 or more servings of fruits and vegetables daily    Hypertension Yes    Intervention Provide education on lifestyle modifcations including regular physical activity/exercise, weight management, moderate sodium restriction and increased consumption of fresh fruit, vegetables, and low fat dairy, alcohol moderation, and smoking cessation.;Monitor prescription use compliance.    Expected Outcomes Short Term: Continued assessment and intervention until BP is < 140/58mm HG in hypertensive participants. < 130/52mm HG in hypertensive participants with diabetes, heart failure or chronic kidney disease.;Long Term: Maintenance of blood pressure at goal levels.    Lipids Yes    Intervention Provide education and support for participant on nutrition & aerobic/resistive exercise along with prescribed medications to achieve LDL '70mg'$ , HDL >$Remo'40mg'IbaKw$ .    Expected Outcomes Short Term: Participant states understanding of desired cholesterol values and  is compliant with medications prescribed. Participant is following exercise prescription and nutrition guidelines.;Long Term: Cholesterol controlled with medications as prescribed, with individualized exercise RX and  with personalized nutrition plan. Value goals: LDL < $Rem'70mg'xRqB$ , HDL > 40 mg.             Education:Diabetes - Individual verbal and written instruction to review signs/symptoms of diabetes, desired ranges of glucose level fasting, after meals and with exercise. Acknowledge that pre and post exercise glucose checks will be done for 3 sessions at entry of program.   Core Components/Risk Factors/Patient Goals Review:   Goals and Risk Factor Review     Row Name 06/18/21 1103             Core Components/Risk Factors/Patient Goals Review   Personal Goals Review Lipids;Hypertension       Review 122/62 today, at home it will be around 120s/60s (she will check when she thinks about it or if she feels like it is off). She is still taking all of her medication as directed. She has not had recent bloodwork, but she stays active and eats heart healthy foods. She sees the cardiologist in September. She is having no issues with her medication and has been taking all of them as directed.       Expected Outcomes ST: check BP at home at least 2x/week LT: continue to manage risk factors.                Core Components/Risk Factors/Patient Goals at Discharge (Final Review):   Goals and Risk Factor Review - 06/18/21 1103       Core Components/Risk Factors/Patient Goals Review   Personal Goals Review Lipids;Hypertension    Review 122/62 today, at home it will be around 120s/60s (she will check when she thinks about it or if she feels like it is off). She is still taking all of her medication as directed. She has not had recent bloodwork, but she stays active and eats heart healthy foods. She sees the cardiologist in September. She is having no issues with her medication and has been taking  all of them as directed.    Expected Outcomes ST: check BP at home at least 2x/week LT: continue to manage risk factors.             ITP Comments:  ITP Comments     Row Name 05/02/21 1414 05/12/21 1029 05/28/21 1119 06/25/21 0751 07/14/21 1115   ITP Comments Initial telephone orientation completed. Diagnosis can be found in [employer] 6/2. EP orientation scheduled for Monday 6/27 at 8am. Completed 6MWT and gym orientation. Initial ITP created and sent for review to Dr. Emily Filbert, Medical Director. 30 Day review completed. Medical Director ITP review done, changes made as directed, and signed approval by Medical Director. 30 Day review completed. Medical Director ITP review done, changes made as directed, and signed approval by Medical Director. Eboney's spouse passed away last week. Caisley reports doing well and staying busy, she has a lot of things to do and take care of.  We will defer 30-day goals until next review.    [place] Name 07/23/21 0714           ITP Comments 30 Day review completed. Medical Director ITP review done, changes made as directed, and signed approval by Medical Director.                Comments:

## 2021-07-25 ENCOUNTER — Encounter: Payer: Medicare PPO | Admitting: *Deleted

## 2021-07-25 ENCOUNTER — Other Ambulatory Visit: Payer: Self-pay

## 2021-07-25 DIAGNOSIS — I214 Non-ST elevation (NSTEMI) myocardial infarction: Secondary | ICD-10-CM | POA: Diagnosis not present

## 2021-07-25 NOTE — Patient Instructions (Signed)
Discharge Patient Instructions  Patient Details  Name: Victoria [name] MRN: [id number] Date of Birth: May 12, 1946 Referring Provider:  Isaias Cowman, MD   Number of Visits: 74  Reason for Discharge:  Patient reached a stable level of exercise. Patient independent in their exercise. Patient has met program and personal goals.  Smoking History:  Social History   Tobacco Use  Smoking Status Never  Smokeless Tobacco Never    Diagnosis:  No diagnosis found.  Initial Exercise Prescription:  Initial Exercise Prescription - 05/12/21 1000       Date of Initial Exercise RX and Referring Provider   Date 05/12/21    Referring Provider Paraschos, Alexander MD      Treadmill   MPH 2.9    Grade 0.5    Minutes 15    METs 3.42      REL-XR   Level 1    Speed 50    Minutes 15    METs 3      Biostep-RELP   Level 2    SPM 50    Minutes 15    METs 3      Track   Laps 41    Minutes 15    METs 3.22      Prescription Details   Frequency (times per week) 3    Duration Progress to 30 minutes of continuous aerobic without signs/symptoms of physical distress      Intensity   THRR 40-80% of Max Heartrate 97-129    Ratings of Perceived Exertion 11-13    Perceived Dyspnea 0-4      Progression   Progression Continue to progress workloads to maintain intensity without signs/symptoms of physical distress.      Resistance Training   Training Prescription Yes    Weight 3 lb    Reps 10-15             Discharge Exercise Prescription (Final Exercise Prescription Changes):  Exercise Prescription Changes - 07/23/21 0800       Response to Exercise   Blood Pressure (Admit) 124/64    Blood Pressure (Exercise) 140/66    Blood Pressure (Exit) 114/60    Heart Rate (Admit) 75 bpm    Heart Rate (Exercise) 106 bpm    Heart Rate (Exit) 90 bpm    Rating of Perceived Exertion (Exercise) 11    Symptoms none    Duration Continue with 30 min of aerobic exercise without  signs/symptoms of physical distress.    Intensity THRR unchanged      Progression   Progression Continue to progress workloads to maintain intensity without signs/symptoms of physical distress.    Average METs 5.21      Resistance Training   Training Prescription Yes    Weight 6 lb    Reps 10-15      Interval Training   Interval Training No      Treadmill   MPH 2.9    Grade 7    Minutes 15    METs 6.02      REL-XR   Level 6    Minutes 15    METs 4.4      Home Exercise Plan   Plans to continue exercise at Home (comment)   walking, staff videos   Frequency Add 2 additional days to program exercise sessions.    Initial Home Exercises Provided 06/18/21             Functional Capacity:  6 Minute Walk     Row  Name 05/12/21 1030 07/16/21 1138       6 Minute Walk   Phase Initial Discharge    Distance 1555 feet 1600 feet    Distance % Change -- 2.8 %    Distance Feet Change -- 45 ft    Walk Time 6 minutes 6 minutes    # of Rest Breaks 0 0    MPH 2.95 3    METS 3.26 3.3    RPE 9 11    Perceived Dyspnea  -- 0    VO2 Peak 11.39 11.64    Symptoms No No    Resting HR 65 bpm 75 bpm    Resting BP 134/62 124/64    Resting Oxygen Saturation  98 % 97 %    Exercise Oxygen Saturation  during 6 min walk 97 % --    Max Ex. HR 99 bpm 106 bpm    Max Ex. BP 146/74 140/66    2 Minute Post BP 126/62 --             Nutrition & Weight - Outcomes:  Pre Biometrics - 05/12/21 1037       Pre Biometrics   Height 4' 10.4" (1.483 m)    Weight 104 lb 3.2 oz (47.3 kg)    BMI (Calculated) 21.49    Single Leg Stand 30 seconds             Post Biometrics - 07/16/21 1139        Post  Biometrics   Height 4' 10.4" (1.483 m)    Weight 108 lb 3.2 oz (49.1 kg)    BMI (Calculated) 22.32    Single Leg Stand 30 seconds             Nutrition:  Nutrition Therapy & Goals - 06/16/21 1036       Nutrition Therapy   Diet Heart healthy, low Na    Drug/Food Interactions  Statins/Certain Fruits    Protein (specify units) 50g    Fiber 25 grams    Whole Grain Foods 3 servings    Saturated Fats 12 max. grams    Fruits and Vegetables 8 servings/day    Sodium 1.5 grams      Personal Nutrition Goals   Nutrition Goal ST: add soymilk, try smoothies with avocados, penaut butter, and/or tofu to add protein and fat LT: meet calorie and protein needs, maintain/gain weight    Comments She tries not to snack and she has established meal times. She has a bread maker and she will add flaxseed and other grains to her bread. She always has cooked vegetables on hand. early B: coffee (black), scrambled egg with tomato and 1 slice of toast (whole wheat) or oatmeal with walnuts with bananas berries. L: soup (vegetables - variety: "everything but the kitchen sink" - beans as well) - she enjoys the broth. About 3/4 cups of soup with water. Drinks: hot green tea and water. D: she will have 3-4 oz of meat if she eats it- usually just vegetables (beans, non starchy vegetables, and starchy vegetables) She will use olive oil or occasionally margarine to cook her foods. S: yogurt with walnuts (whatever is on sale). She does not feel hunger during the day and feels satiated with her meals. She was 140lbs 3 years ago, went to 120lbs 1 year ago, now 105lbs. Discussed weight maintenance/gain and ensure kcal and protein needs are met. Reviewed heart healthy eating. She would like to try smoothies. She is open to  trying soymilk and making smoothies with tofu, peanut butter, and avocados.      Intervention Plan   Intervention Prescribe, educate and counsel regarding individualized specific dietary modifications aiming towards targeted core components such as weight, hypertension, lipid management, diabetes, heart failure and other comorbidities.    Expected Outcomes Short Term Goal: Understand basic principles of dietary content, such as calories, fat, sodium, cholesterol and nutrients.;Short Term Goal: A  plan has been developed with personal nutrition goals set during dietitian appointment.;Long Term Goal: Adherence to prescribed nutrition plan.             Nutrition Discharge:   Education Questionnaire Score:  Knowledge Questionnaire Score - 07/23/21 1153       Knowledge Questionnaire Score   Post Score 26/26             Goals reviewed with patient; copy given to patient.

## 2021-07-25 NOTE — Progress Notes (Signed)
Daily Session Note  Patient Details  Name: Victoria [name] MRN: [id number] Date of Birth: 03-13-1946 Referring Provider:   Flowsheet Row Cardiac Rehab from 05/12/2021 in [facility] Cardiac and Pulmonary Rehab  Referring Provider Isaias Cowman MD       Encounter Date: 07/25/2021  Check In:  Session Check In - 07/25/21 1113       Check-In   Supervising physician immediately available to respond to emergencies See telemetry face sheet for immediately available ER MD    Location [facility]-Cardiac & Pulmonary Rehab    Staff Present Renita Papa, RN BSN;Joseph Diehlstadt, RCP,RRT,BSRT;Jessica Rhine, [place], RCEP, CCRP, CCET    Virtual Visit No    Medication changes reported     No    Fall or balance concerns reported    No    Warm-up and Cool-down Performed on first and last piece of equipment    Resistance Training Performed Yes    VAD Patient? No    PAD/SET Patient? No      Pain Assessment   Currently in Pain? No/denies                Social History   Tobacco Use  Smoking Status Never  Smokeless Tobacco Never    Goals Met:  Independence with exercise equipment Exercise tolerated well No report of concerns or symptoms today Strength training completed today  Goals Unmet:  Not Applicable  Comments: Pt able to follow exercise prescription today without complaint.  Will continue to monitor for progression.    Dr. Emily Filbert is Medical Director for [place].  Dr. Ottie Glazier is Medical Director for [facility] Pulmonary Rehabilitation.

## 2021-07-28 ENCOUNTER — Other Ambulatory Visit: Payer: Self-pay

## 2021-07-28 ENCOUNTER — Encounter: Payer: Medicare PPO | Admitting: *Deleted

## 2021-07-28 DIAGNOSIS — I214 Non-ST elevation (NSTEMI) myocardial infarction: Secondary | ICD-10-CM | POA: Diagnosis not present

## 2021-07-28 NOTE — Progress Notes (Signed)
Discharge Progress Report  Patient Details  Name: Victoria [name] MRN: [id number] Date of Birth: 04/07/1946 Referring Provider:   Flowsheet Row Cardiac Rehab from 05/12/2021 in [facility]  Referring Provider Isaias Cowman MD        Number of Visits: 36  Reason for Discharge:  Patient reached a stable level of exercise. Patient independent in their exercise. Patient has met program and personal goals.  Smoking History:  Social History   Tobacco Use  Smoking Status Never  Smokeless Tobacco Never    Diagnosis:  NSTEMI (non-ST elevated myocardial infarction) ([place])  ADL [facility]:   Initial Exercise Prescription:  Initial Exercise Prescription - 05/12/21 1000       Date of Initial Exercise RX and Referring Provider   Date 05/12/21    Referring Provider Isaias Cowman MD      Treadmill   MPH 2.9    Grade 0.5    Minutes 15    METs 3.42      REL-XR   Level 1    Speed 50    Minutes 15    METs 3      Biostep-RELP   Level 2    SPM 50    Minutes 15    METs 3      Track   Laps 41    Minutes 15    METs 3.22      Prescription Details   Frequency (times per week) 3    Duration Progress to 30 minutes of continuous aerobic without signs/symptoms of physical distress      Intensity   THRR 40-80% of Max Heartrate 97-129    Ratings of Perceived Exertion 11-13    Perceived Dyspnea 0-4      Progression   Progression Continue to progress workloads to maintain intensity without signs/symptoms of physical distress.      Resistance Training   Training Prescription Yes    Weight 3 lb    Reps 10-15             Discharge Exercise Prescription (Final Exercise Prescription Changes):  Exercise Prescription Changes - 07/23/21 0800       Response to Exercise   Blood Pressure (Admit) 124/64    Blood Pressure (Exercise) 140/66    Blood Pressure (Exit) 114/60    Heart Rate (Admit) 75 bpm    Heart Rate (Exercise) 106 bpm    Heart  Rate (Exit) 90 bpm    Rating of Perceived Exertion (Exercise) 11    Symptoms none    Duration Continue with 30 min of aerobic exercise without signs/symptoms of physical distress.    Intensity THRR unchanged      Progression   Progression Continue to progress workloads to maintain intensity without signs/symptoms of physical distress.    Average METs 5.21      Resistance Training   Training Prescription Yes    Weight 6 lb    Reps 10-15      Interval Training   Interval Training No      Treadmill   MPH 2.9    Grade 7    Minutes 15    METs 6.02      REL-XR   Level 6    Minutes 15    METs 4.4      Home Exercise Plan   Plans to continue exercise at Home (comment)   walking, staff videos   Frequency Add 2 additional days to program exercise sessions.  Initial Home Exercises Provided 06/18/21             Functional Capacity:  6 Minute Walk     Row Name 05/12/21 1030 07/16/21 1138       6 Minute Walk   Phase Initial Discharge    Distance 1555 feet 1600 feet    Distance % Change -- 2.8 %    Distance Feet Change -- 45 ft    Walk Time 6 minutes 6 minutes    # of Rest Breaks 0 0    MPH 2.95 3    METS 3.26 3.3    RPE 9 11    Perceived Dyspnea  -- 0    VO2 Peak 11.39 11.64    Symptoms No No    Resting HR 65 bpm 75 bpm    Resting BP 134/62 124/64    Resting Oxygen Saturation  98 % 97 %    Exercise Oxygen Saturation  during 6 min walk 97 % --    Max Ex. HR 99 bpm 106 bpm    Max Ex. BP 146/74 140/66    2 Minute Post BP 126/62 --             Psychological, QOL, Others - Outcomes: PHQ 2/9: Depression screen [facility] 2/9 07/23/2021 05/12/2021  Decreased Interest 0 0  Down, Depressed, Hopeless 0 0  PHQ - 2 Score 0 0  Altered sleeping 0 0  Tired, decreased energy 0 0  Change in appetite 0 0  Feeling bad or failure about yourself  0 0  Trouble concentrating 0 0  Moving slowly or fidgety/restless 0 0  Suicidal thoughts 0 0  PHQ-9 Score 0 0  Difficult doing  work/chores - Not difficult at all    Quality of Life:  Quality of Life - 07/23/21 1153       Quality of Life   Select Quality of Life      Quality of Life Scores   Health/Function Pre 26.7 %    Health/Function Post 27.38 %    Health/Function % Change 2.55 %    Socioeconomic Pre 25.36 %    Socioeconomic Post 28.33 %    Socioeconomic % Change  11.71 %    Psych/Spiritual Pre 24.93 %    Psych/Spiritual Post 27.86 %    Psych/Spiritual % Change 11.75 %    Family Pre 16.4 %    Family Post 12.5 %    Family % Change -23.78 %    GLOBAL Pre 24.54 %    GLOBAL Post 26.61 %    GLOBAL % Change 8.44 %                Nutrition & Weight - Outcomes:  Pre Biometrics - 05/12/21 1037       Pre Biometrics   Height 4' 10.4" (1.483 m)    Weight 104 lb 3.2 oz (47.3 kg)    BMI (Calculated) 21.49    Single Leg Stand 30 seconds             Post Biometrics - 07/16/21 1139        Post  Biometrics   Height 4' 10.4" (1.483 m)    Weight 108 lb 3.2 oz (49.1 kg)    BMI (Calculated) 22.32    Single Leg Stand 30 seconds             Nutrition:  Nutrition Therapy & Goals - 06/16/21 1036       Nutrition Therapy  Diet Heart healthy, low Na    Drug/Food Interactions Statins/Certain Fruits    Protein (specify units) 50g    Fiber 25 grams    Whole Grain Foods 3 servings    Saturated Fats 12 max. grams    Fruits and Vegetables 8 servings/day    Sodium 1.5 grams      Personal Nutrition Goals   Nutrition Goal ST: add soymilk, try smoothies with avocados, penaut butter, and/or tofu to add protein and fat LT: meet calorie and protein needs, maintain/gain weight    Comments She tries not to snack and she has established meal times. She has a bread maker and she will add flaxseed and other grains to her bread. She always has cooked vegetables on hand. early B: coffee (black), scrambled egg with tomato and 1 slice of toast (whole wheat) or oatmeal with walnuts with bananas berries. L:  soup (vegetables - variety: "everything but the kitchen sink" - beans as well) - she enjoys the broth. About 3/4 cups of soup with water. Drinks: hot green tea and water. D: she will have 3-4 oz of meat if she eats it- usually just vegetables (beans, non starchy vegetables, and starchy vegetables) She will use olive oil or occasionally margarine to cook her foods. S: yogurt with walnuts (whatever is on sale). She does not feel hunger during the day and feels satiated with her meals. She was 140lbs 3 years ago, went to 120lbs 1 year ago, now 105lbs. Discussed weight maintenance/gain and ensure kcal and protein needs are met. Reviewed heart healthy eating. She would like to try smoothies. She is open to trying soymilk and making smoothies with tofu, peanut butter, and avocados.      Intervention Plan   Intervention Prescribe, educate and counsel regarding individualized specific dietary modifications aiming towards targeted core components such as weight, hypertension, lipid management, diabetes, heart failure and other comorbidities.    Expected Outcomes Short Term Goal: Understand basic principles of dietary content, such as calories, fat, sodium, cholesterol and nutrients.;Short Term Goal: A plan has been developed with personal nutrition goals set during dietitian appointment.;Long Term Goal: Adherence to prescribed nutrition plan.               Education Questionnaire Score:  Knowledge Questionnaire Score - 07/23/21 1153       Knowledge Questionnaire Score   Post Score 26/26             Goals reviewed with patient; copy given to patient.

## 2021-07-28 NOTE — Progress Notes (Signed)
Daily Session Note  Patient Details  Name: Victoria [name] MRN: [id number] Date of Birth: Oct 27, 1946 Referring Provider:   Flowsheet Row Cardiac Rehab from 05/12/2021 in [facility] Cardiac [facility] Pul[facility] Rehab  Referring Provider Isaias Cowman MD       Encounter Date: 07/28/2021  Check In:  Session Check In - 07/28/21 1129       Check-In   Supervising physician immediately available to respond to emergencies See telemetry face sheet for immediately available ER MD    Location [facility]-Cardiac & Pulmonary Rehab    Staff Present Justin Mend, Jaci Carrel, BS, ACSM CEP, Exercise Physiologist;Gerrett Loman Sherryll Burger, RN Sherryl Barters, MPA, RN    Virtual Visit No    Medication changes reported     No    Fall or balance concerns reported    No    Warm-up and Cool-down Performed on first and last piece of equipment    Resistance Training Performed Yes    VAD Patient? No    PAD/SET Patient? No      Pain Assessment   Currently in Pain? No/denies                Social History   Tobacco Use  Smoking Status Never  Smokeless Tobacco Never    Goals Met:  Independence with exercise equipment Exercise tolerated well No report of concerns or symptoms today Strength training completed today  Goals Unmet:  Not Applicable  Comments:  Victoria [name] graduated today from  rehab with 36 sessions completed.  Details of the patient's exercise prescription and what She needs to do in order to continue the prescription and progress were discussed with patient.  Patient was given a copy of prescription and goals.  Patient verbalized understanding.  Victoria [name] plans to continue to exercise by walking at home.    Dr. Emily Filbert is Medical Director for [place].  Dr. Ottie Glazier is Medical Director for [facility] Pulmonary Rehabilitation.

## 2021-07-28 NOTE — Progress Notes (Signed)
Cardiac Individual Treatment Plan  Patient Details  Name: Victoria [name] MRN: [id number] Date of Birth: November 24, 1945 Referring Provider:   Flowsheet Row Cardiac Rehab from 05/12/2021 in [facility] Cardiac and Pulmonary Rehab  Referring Provider Isaias Cowman MD       Initial Encounter Date:  Flowsheet Row Cardiac Rehab from 05/12/2021 in [facility] Cardiac and Pulmonary Rehab  Date 05/12/21       Visit Diagnosis: NSTEMI (non-ST elevated myocardial infarction) [facility])  Patient's Home Medications on Admission:  Current Outpatient Medications:    apixaban (ELIQUIS) 5 MG TABS tablet, Take 1 tablet (5 mg total) by mouth 2 (two) times daily., Disp: 60 tablet, Rfl: 2   atorvastatin (LIPITOR) 80 MG tablet, Take 1 tablet (80 mg total) by mouth daily., Disp: 30 tablet, Rfl: 1   b complex vitamins capsule, Take 1 capsule by mouth daily. (Patient not taking: No sig reported), Disp: , Rfl:    Cholecalciferol 25 MCG (1000 UT) tablet, Take 1 tablet by mouth daily., Disp: , Rfl:    clopidogrel (PLAVIX) 75 MG tablet, Take 1 tablet (75 mg total) by mouth daily with breakfast., Disp: 30 tablet, Rfl: 1   losartan (COZAAR) 50 MG tablet, Take 1 tablet (50 mg total) by mouth daily., Disp: 30 tablet, Rfl: 1   metoprolol tartrate (LOPRESSOR) 25 MG tablet, Take 1 tablet (25 mg total) by mouth 2 (two) times daily., Disp: 60 tablet, Rfl: 1   Multiple Vitamin (MULTI-VITAMIN) tablet, Take 1 tablet by mouth daily., Disp: , Rfl:   Past Medical History: Past Medical History:  Diagnosis Date   Arthritis    Headache    Hyperlipidemia    Hypertension     Tobacco Use: Social History   Tobacco Use  Smoking Status Never  Smokeless Tobacco Never    Labs: Recent Review Flowsheet Data     Labs for ITP Cardiac and Pulmonary Rehab Latest Ref Rng & Units 03/19/2021   Cholestrol 0 - 200 mg/dL 145   LDLCALC 0 - 99 mg/dL 82   HDL >40 mg/dL 44   Trlycerides <150 mg/dL 96   Hemoglobin A1c 4.8 - 5.6 % 5.7(H)         Exercise Target Goals: Exercise Program Goal: Individual exercise prescription set using results from initial 6 min walk test and THRR while considering  patient's activity barriers and safety.   Exercise Prescription Goal: Initial exercise prescription builds to 30-45 minutes a day of aerobic activity, 2-3 days per week.  Home exercise guidelines will be given to patient during program as part of exercise prescription that the participant will acknowledge.   Education: Aerobic Exercise: - Group verbal and visual presentation on the components of exercise prescription. Introduces F.I.T.T principle from ACSM for exercise prescriptions.  Reviews F.I.T.T. principles of aerobic exercise including progression. Written material given at graduation. Flowsheet Row Cardiac Rehab from 07/23/2021 in [facility] Cardiac and Pulmonary Rehab  Date 07/09/21  Educator [facility]  Instruction Review Code 1- Verbalizes Understanding       Education: Resistance Exercise: - Group verbal and visual presentation on the components of exercise prescription. Introduces F.I.T.T principle from ACSM for exercise prescriptions  Reviews F.I.T.T. principles of resistance exercise including progression. Written material given at graduation. Flowsheet Row Cardiac Rehab from 07/23/2021 in [facility] Cardiac and Pulmonary Rehab  Date 07/16/21  Educator [facility]  Instruction Review Code 1- Verbalizes Understanding        Education: Exercise & Equipment Safety: - Individual verbal instruction and demonstration of equipment use and  safety with use of the equipment. Flowsheet Row Cardiac Rehab from 07/23/2021 in [facility] Cardiac and Pulmonary Rehab  Date 05/12/21  Educator [facility]  Instruction Review Code 1- Verbalizes Understanding       Education: Exercise Physiology & General Exercise Guidelines: - Group verbal and written instruction with models to review the exercise physiology of the cardiovascular system and associated critical values.  Provides general exercise guidelines with specific guidelines to those with heart or lung disease.  Flowsheet Row Cardiac Rehab from 07/23/2021 in [facility] Cardiac and Pulmonary Rehab  Date 07/02/21  Educator [facility]  Instruction Review Code 1- Verbalizes Understanding       Education: Flexibility, Balance, Mind/Body Relaxation: - Group verbal and visual presentation with interactive activity on the components of exercise prescription. Introduces F.I.T.T principle from ACSM for exercise prescriptions. Reviews F.I.T.T. principles of flexibility and balance exercise training including progression. Also discusses the mind body connection.  Reviews various relaxation techniques to help reduce and manage stress (i.e. Deep breathing, progressive muscle relaxation, and visualization). Balance handout provided to take home. Written material given at graduation. Flowsheet Row Cardiac Rehab from 07/23/2021 in [facility] Cardiac and Pulmonary Rehab  Date 07/23/21  Educator [facility]  Instruction Review Code 1- Verbalizes Understanding       Activity Barriers & Risk Stratification:  Activity Barriers & Cardiac Risk Stratification - 05/12/21 1034       Activity Barriers & Cardiac Risk Stratification   Activity Barriers None    Cardiac Risk Stratification High             6 Minute Walk:  6 Minute Walk     Row Name 05/12/21 1030 07/16/21 1138       6 Minute Walk   Phase Initial Discharge    Distance 1555 feet 1600 feet    Distance % Change -- 2.8 %    Distance Feet Change -- 45 ft    Walk Time 6 minutes 6 minutes    # of Rest Breaks 0 0    MPH 2.95 3    METS 3.26 3.3    RPE 9 11    Perceived Dyspnea  -- 0    VO2 Peak 11.39 11.64    Symptoms No No    Resting HR 65 bpm 75 bpm    Resting BP 134/62 124/64    Resting Oxygen Saturation  98 % 97 %    Exercise Oxygen Saturation  during 6 min walk 97 % --    Max Ex. HR 99 bpm 106 bpm    Max Ex. BP 146/74 140/66    2 Minute Post BP 126/62 --              Oxygen Initial Assessment:   Oxygen Re-Evaluation:   Oxygen Discharge (Final Oxygen Re-Evaluation):   Initial Exercise Prescription:  Initial Exercise Prescription - 05/12/21 1000       Date of Initial Exercise RX and Referring Provider   Date 05/12/21    Referring Provider Isaias Cowman MD      Treadmill   MPH 2.9    Grade 0.5    Minutes 15    METs 3.42      REL-XR   Level 1    Speed 50    Minutes 15    METs 3      Biostep-RELP   Level 2    SPM 50    Minutes 15    METs 3      Track   Laps  41    Minutes 15    METs 3.22      Prescription Details   Frequency (times per week) 3    Duration Progress to 30 minutes of continuous aerobic without signs/symptoms of physical distress      Intensity   THRR 40-80% of Max Heartrate 97-129    Ratings of Perceived Exertion 11-13    Perceived Dyspnea 0-4      Progression   Progression Continue to progress workloads to maintain intensity without signs/symptoms of physical distress.      Resistance Training   Training Prescription Yes    Weight 3 lb    Reps 10-15             Perform Capillary Blood Glucose checks as needed.  Exercise Prescription Changes:   Exercise Prescription Changes     Row Name 05/12/21 1000 05/26/21 0900 06/09/21 1500 06/18/21 1100 06/23/21 1500     Response to Exercise   Blood Pressure (Admit) 134/62 134/62 112/62 -- 132/70   Blood Pressure (Exercise) 146/74 124/70 -- -- --   Blood Pressure (Exit) 126/62 124/66 106/60 -- 112/60   Heart Rate (Admit) 65 bpm 64 bpm 60 bpm -- 72 bpm   Heart Rate (Exercise) 99 bpm 105 bpm 103 bpm -- 119 bpm   Heart Rate (Exit) 67 bpm 92 bpm 76 bpm -- 107 bpm   Oxygen Saturation (Admit) 98 % -- -- -- --   Oxygen Saturation (Exercise) 97 % -- -- -- --   Rating of Perceived Exertion (Exercise) _0 -- 13   Symptoms none none none -- none   Comments walk test results first full day of exercise -- -- --   Duration -- Continue with 30 min  of aerobic exercise without signs/symptoms of physical distress. Continue with 30 min of aerobic exercise without signs/symptoms of physical distress. -- Continue with 30 min of aerobic exercise without signs/symptoms of physical distress.   Intensity -- THRR unchanged THRR unchanged -- THRR unchanged     Progression   Progression -- Continue to progress workloads to maintain intensity without signs/symptoms of physical distress. Continue to progress workloads to maintain intensity without signs/symptoms of physical distress. -- Continue to progress workloads to maintain intensity without signs/symptoms of physical distress.   Average METs -- 4.24 4.43 -- 5.36     Resistance Training   Training Prescription -- Yes Yes -- Yes   Weight -- 3 lb 3 lb -- 3 lb   Reps -- 10-15 10-15 -- 10-15     Interval Training   Interval Training -- -- No -- No     Treadmill   MPH -- 2.9 3.2 -- 2.9   Grade -- 3 6.5 -- 8.5   Minutes -- 15 15 -- 15   METs -- 4.42 6.3 -- 6.62     REL-XR   Level -- 5 6 -- --   Minutes -- 15 15 -- --   METs -- 5.2 2.9 -- --     Biostep-RELP   Level -- -- 2 -- --   Minutes -- -- 15 -- --     Track   Laps -- 38 40 -- --   Minutes -- 15 15 -- --   METs -- 3.07 3.18 -- --     Home Exercise Plan   Plans to continue exercise at -- -- -- Home (comment)  walking, staff videos Home (comment)  walking, staff videos   Frequency -- -- --  Add 2 additional days to program exercise sessions. Add 2 additional days to program exercise sessions.   Initial Home Exercises Provided -- -- -- 06/18/21 06/18/21    Row Name 07/08/21 1500 07/23/21 0800           Response to Exercise   Blood Pressure (Admit) 118/64 124/64      Blood Pressure (Exercise) -- 140/66      Blood Pressure (Exit) 110/54 114/60      Heart Rate (Admit) 63 bpm 75 bpm      Heart Rate (Exercise) 117 bpm 106 bpm      Heart Rate (Exit) 106 bpm 90 bpm      Rating of Perceived Exertion (Exercise) 11 11      Symptoms  none none      Duration Continue with 30 min of aerobic exercise without signs/symptoms of physical distress. Continue with 30 min of aerobic exercise without signs/symptoms of physical distress.      Intensity THRR unchanged THRR unchanged             Progression   Progression Continue to progress workloads to maintain intensity without signs/symptoms of physical distress. Continue to progress workloads to maintain intensity without signs/symptoms of physical distress.      Average METs 4.97 5.21             Resistance Training   Training Prescription Yes Yes      Weight 3 lb 6 lb      Reps 10-15 10-15             Interval Training   Interval Training No No             Treadmill   MPH 3.1 2.9      Grade 8 7      Minutes 15 15      METs 6.79 6.02             REL-XR   Level 6 6      Minutes 15 15      METs 3.7 4.4             Biostep-RELP   Level 4 --      Minutes 15 --      METs 3 --             Track   Laps 40 --      Minutes 15 --      METs 3.18 --             Home Exercise Plan   Plans to continue exercise at Home (comment)  walking, staff videos Home (comment)  walking, staff videos      Frequency Add 2 additional days to program exercise sessions. Add 2 additional days to program exercise sessions.      Initial Home Exercises Provided 06/18/21 06/18/21               Exercise Comments:   Exercise Goals and Review:   Exercise Goals     Row Name 05/12/21 1037             Exercise Goals   Increase Physical Activity Yes       Intervention Provide advice, education, support and counseling about physical activity/exercise needs.;Develop an individualized exercise prescription for aerobic and resistive training based on initial evaluation findings, risk stratification, comorbidities and participant's personal goals.       Expected Outcomes Short Term: Attend rehab on a regular basis to  increase amount of physical activity.;Long Term: Add in home  exercise to make exercise part of routine and to increase amount of physical activity.;Long Term: Exercising regularly at least 3-5 days a week.       Increase Strength and Stamina Yes       Intervention Provide advice, education, support and counseling about physical activity/exercise needs.;Develop an individualized exercise prescription for aerobic and resistive training based on initial evaluation findings, risk stratification, comorbidities and participant's personal goals.       Expected Outcomes Short Term: Increase workloads from initial exercise prescription for resistance, speed, and METs.;Short Term: Perform resistance training exercises routinely during rehab and add in resistance training at home;Long Term: Improve cardiorespiratory fitness, muscular endurance and strength as measured by increased METs and functional capacity (6MWT)       Able to understand and use rate of perceived exertion (RPE) scale Yes       Intervention Provide education and explanation on how to use RPE scale       Expected Outcomes Short Term: Able to use RPE daily in rehab to express subjective intensity level;Long Term:  Able to use RPE to guide intensity level when exercising independently       Able to understand and use Dyspnea scale Yes       Intervention Provide education and explanation on how to use Dyspnea scale       Expected Outcomes Short Term: Able to use Dyspnea scale daily in rehab to express subjective sense of shortness of breath during exertion;Long Term: Able to use Dyspnea scale to guide intensity level when exercising independently       Knowledge and understanding of Target Heart Rate Range (THRR) Yes       Intervention Provide education and explanation of THRR including how the numbers were predicted and where they are located for reference       Expected Outcomes Short Term: Able to state/look up THRR;Short Term: Able to use daily as guideline for intensity in rehab;Long Term: Able to use  THRR to govern intensity when exercising independently       Able to check pulse independently Yes       Intervention Provide education and demonstration on how to check pulse in carotid and radial arteries.;Review the importance of being able to check your own pulse for safety during independent exercise       Expected Outcomes Short Term: Able to explain why pulse checking is important during independent exercise;Long Term: Able to check pulse independently and accurately       Understanding of Exercise Prescription Yes       Intervention Provide education, explanation, and written materials on patient's individual exercise prescription       Expected Outcomes Short Term: Able to explain program exercise prescription;Long Term: Able to explain home exercise prescription to exercise independently                Exercise Goals Re-Evaluation :  Exercise Goals Re-Evaluation     Row Name 05/26/21 1002 06/09/21 1515 06/18/21 1104 06/18/21 1136 06/23/21 1600     Exercise Goal Re-Evaluation   Exercise Goals Review Increase Physical Activity;Increase Strength and Stamina Increase Physical Activity;Increase Strength and Stamina;Understanding of Exercise Prescription Increase Physical Activity;Increase Strength and Stamina;Understanding of Exercise Prescription Increase Physical Activity;Increase Strength and Stamina;Understanding of Exercise Prescription;Able to understand and use rate of perceived exertion (RPE) scale;Able to understand and use Dyspnea scale;Knowledge and understanding of Target Heart Rate Range (THRR);Able to check pulse  independently Increase Physical Activity;Increase Strength and Stamina   Comments Coralee is doing great her first couple of weeks being at rehab. She has already increased her load on the XR to level 5 and increased incline on the treadmill to 3%. Will continue to monitor. Madalynne is doing well in rehab.  She is up to 30 watts on the bike and 6.5 % incline on the  treadmill.  We will conitnue to monitor her progress. Yiselle reports being very active and gardening. She will walk 2 miles everyday - she has an exercise bike, but she feels that it is very hard. She will do 7000 steps per day. Reviewed home exercise with pt today.  Pt plans to continue walking and using staff videos at home for exercise.  Reviewed THR, pulse, RPE, sign and symptoms, pulse oximetery and when to call 911 or MD.  Also discussed weather considerations and indoor options.  Pt voiced understanding. Sharnette attends consistently and reaches THR range.  Staff will encourage trying 4 lb for strength work.   Expected Outcomes Short: Continue to increase loads on machines and track Long: Continue to build overall strength and stamina Short: Try 4 lb hand weights Long: Conitnue to improve stamina. ST: EP to go over home exercise LT: Continue to improve stamina Short: Continue to add in walking at home Long: COntinue to improve stamina Short:  increase weight to 4lb Long: build overall stamina    Row Name 07/08/21 1537 07/23/21 0809           Exercise Goal Re-Evaluation   Exercise Goals Review Increase Physical Activity;Increase Strength and Stamina Increase Physical Activity;Increase Strength and Stamina      Comments Ronasia is doing well in rehab. She has reached level 4 on the Biostep and up to 6.79 METs on the treadmill! Will continue to monitor. She should be encouraged to increase her handweights given her average MET level Artina will graduate soon. She did improve her post 6MWT. She has worked up to 13.5% incline on TM for intervals.      Expected Outcomes Short: Increase handweights Long: Increase overall MET level Short:  complete HT program Long:  maintain exercise on her own               Discharge Exercise Prescription (Final Exercise Prescription Changes):  Exercise Prescription Changes - 07/23/21 0800       Response to Exercise   Blood Pressure (Admit) 124/64    Blood Pressure  (Exercise) 140/66    Blood Pressure (Exit) 114/60    Heart Rate (Admit) 75 bpm    Heart Rate (Exercise) 106 bpm    Heart Rate (Exit) 90 bpm    Rating of Perceived Exertion (Exercise) 11    Symptoms none    Duration Continue with 30 min of aerobic exercise without signs/symptoms of physical distress.    Intensity THRR unchanged      Progression   Progression Continue to progress workloads to maintain intensity without signs/symptoms of physical distress.    Average METs 5.21      Resistance Training   Training Prescription Yes    Weight 6 lb    Reps 10-15      Interval Training   Interval Training No      Treadmill   MPH 2.9    Grade 7    Minutes 15    METs 6.02      REL-XR   Level 6    Minutes 15  METs 4.4      Home Exercise Plan   Plans to continue exercise at Home (comment)   walking, staff videos   Frequency Add 2 additional days to program exercise sessions.    Initial Home Exercises Provided 06/18/21             Nutrition:  Target Goals: Understanding of nutrition guidelines, daily intake of sodium <15108m, cholesterol <2026m calories 30% from fat and 7% or less from saturated fats, daily to have 5 or more servings of fruits and vegetables.  Education: All About Nutrition: -Group instruction provided by verbal, written material, interactive activities, discussions, models, and posters to present general guidelines for heart healthy nutrition including fat, fiber, MyPlate, the role of sodium in heart healthy nutrition, utilization of the nutrition label, and utilization of this knowledge for meal planning. Follow up email sent as well. Written material given at graduation. [facility]  Date 05/28/21  [facility] Code 1- Verbalizes Understanding       Biometrics:  Pre Biometrics - 05/12/21 1037       Pre Biometrics   Height 4' 10.4" (1.483 m)    Weight 104 lb 3.2  oz (47.3 kg)    BMI (Calculated) 21.49    Single Leg Stand 30 seconds             Post Biometrics - 07/16/21 1139        Post  Biometrics   Height 4' 10.4" (1.483 m)    Weight 108 lb 3.2 oz (49.1 kg)    BMI (Calculated) 22.32    Single Leg Stand 30 seconds             Nutrition Therapy Plan and Nutrition Goals:  Nutrition Therapy & Goals - 06/16/21 1036       Nutrition Therapy   Diet Heart healthy, low Na    Drug/Food Interactions Statins/Certain Fruits    Protein (specify units) 50g    Fiber 25 grams    Whole Grain Foods 3 servings    Saturated Fats 12 max. grams    Fruits and Vegetables 8 servings/day    Sodium 1.5 grams      Personal Nutrition Goals   Nutrition Goal ST: add soymilk, try smoothies with avocados, penaut butter, and/or tofu to add protein and fat LT: meet calorie and protein needs, maintain/gain weight    Comments She tries not to snack and she has established meal times. She has a bread maker and she will add flaxseed and other grains to her bread. She always has cooked vegetables on hand. early B: coffee (black), scrambled egg with tomato and 1 slice of toast (whole wheat) or oatmeal with walnuts with bananas berries. L: soup (vegetables - variety: "everything but the kitchen sink" - beans as well) - she enjoys the broth. About 3/4 cups of soup with water. Drinks: hot green tea and water. D: she will have 3-4 oz of meat if she eats it- usually just vegetables (beans, non starchy vegetables, and starchy vegetables) She will use olive oil or occasionally margarine to cook her foods. S: yogurt with walnuts (whatever is on sale). She does not feel hunger during the day and feels satiated with her meals. She was 140lbs 3 years ago, went to 120lbs 1 year ago, now 105lbs. Discussed weight maintenance/gain and ensure kcal and protein needs are met. Reviewed heart healthy eating. She would like to try smoothies.  She is open to trying soymilk and making smoothies with  tofu, peanut butter, and avocados.      Intervention Plan   Intervention Prescribe, educate and counsel regarding individualized specific dietary modifications aiming towards targeted core components such as weight, hypertension, lipid management, diabetes, heart failure and other comorbidities.    Expected Outcomes Short Term Goal: Understand basic principles of dietary content, such as calories, fat, sodium, cholesterol and nutrients.;Short Term Goal: A plan has been developed with personal nutrition goals set during dietitian appointment.;Long Term Goal: Adherence to prescribed nutrition plan.             Nutrition Assessments:  MEDIFICTS Score Key: ?70 Need to make dietary changes  40-70 Heart Healthy Diet ? 40 Therapeutic Level Cholesterol Diet  Flowsheet Row Cardiac Rehab from 07/23/2021 in [facility] Cardiac and Pulmonary Rehab  Picture Your Plate Total Score on Discharge 83      Picture Your Plate Scores: <74 Unhealthy dietary pattern with much room for improvement. 41-50 Dietary pattern unlikely to meet recommendations for good health and room for improvement. 51-60 More healthful dietary pattern, with some room for improvement.  >60 Healthy dietary pattern, although there may be some specific behaviors that could be improved.    Nutrition Goals Re-Evaluation:   Nutrition Goals Discharge (Final Nutrition Goals Re-Evaluation):   Psychosocial: Target Goals: Acknowledge presence or absence of significant depression and/or stress, maximize coping skills, provide positive support system. Participant is able to verbalize types and ability to use techniques and skills needed for reducing stress and depression.   Education: Stress, Anxiety, and Depression - Group verbal and visual presentation to define topics covered.  Reviews how body is impacted by stress, anxiety, and depression.  Also discusses healthy ways to reduce stress and to treat/manage anxiety and depression.  Written  material given at graduation.   Education: Sleep Hygiene -Provides group verbal and written instruction about how sleep can affect your health.  Define sleep hygiene, discuss sleep cycles and impact of sleep habits. Review good sleep hygiene tips.    Initial Review & Psychosocial Screening:  Initial Psych Review & Screening - 05/02/21 1409       Initial Review   Current issues with None Identified      Family Dynamics   Good Support System? Yes      Barriers   Psychosocial barriers to participate in program There are no identifiable barriers or psychosocial needs.;The patient should benefit from training in stress management and relaxation.      Screening Interventions   Interventions Provide feedback about the scores to participant;Encouraged to exercise;To provide support and resources with identified psychosocial needs    Expected Outcomes Short Term goal: Utilizing psychosocial counselor, staff and physician to assist with identification of specific Stressors or current issues interfering with healing process. Setting desired goal for each stressor or current issue identified.;Long Term Goal: Stressors or current issues are controlled or eliminated.;Short Term goal: Identification and review with participant of any Quality of Life or Depression concerns found by scoring the questionnaire.;Long Term goal: The participant improves quality of Life and PHQ9 Scores as seen by post scores and/or verbalization of changes             Quality of Life Scores:   Quality of Life - 07/23/21 1153       Quality of Life   Select Quality of Life      Quality of Life Scores   Health/Function Pre 26.7 %    Health/Function  Post 27.38 %    Health/Function % Change 2.55 %    Socioeconomic Pre 25.36 %    Socioeconomic Post 28.33 %    Socioeconomic % Change  11.71 %    Psych/Spiritual Pre 24.93 %    Psych/Spiritual Post 27.86 %    Psych/Spiritual % Change 11.75 %    Family Pre 16.4 %     Family Post 12.5 %    Family % Change -23.78 %    GLOBAL Pre 24.54 %    GLOBAL Post 26.61 %    GLOBAL % Change 8.44 %            Scores of 19 and below usually indicate a poorer quality of life in these areas.  A difference of  2-3 points is a clinically meaningful difference.  A difference of 2-3 points in the total score of the Quality of Life Index has been associated with significant improvement in overall quality of life, self-image, physical symptoms, and general health in studies assessing change in quality of life.  PHQ-9: Recent Review Flowsheet Data     Depression screen [facility] 2/9 07/23/2021 05/12/2021   Decreased Interest 0 0   Down, Depressed, Hopeless 0 0   PHQ - 2 Score 0 0   Altered sleeping 0 0   Tired, decreased energy 0 0   Change in appetite 0 0   Feeling bad or failure about yourself  0 0   Trouble concentrating 0 0   Moving slowly or fidgety/restless 0 0   Suicidal thoughts 0 0   PHQ-9 Score 0 0   Difficult doing work/chores - Not difficult at all      Interpretation of Total Score  Total Score Depression Severity:  1-4 = Minimal depression, 5-9 = Mild depression, 10-14 = Moderate depression, 15-19 = Moderately severe depression, 20-27 = Severe depression   Psychosocial Evaluation and Intervention:  Psychosocial Evaluation - 05/02/21 1430       Psychosocial Evaluation & Interventions   Comments 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 She nevery experienced any pain and is glad her heart had naturally compensated for the lack of blood flow by building collaterals. 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 She is happy to report that she doesn't notice any deficits from the stroke. Prior to covid [facility] [employer] and is looking at getting back to an active lifestyle. She is very involved in her family's lives and enjoys her family time.    Expected Outcomes Short: attend cardiac rehab for education  and exercise. Long: develop and maintain positive self care habits.    Continue Psychosocial Services  Follow up required by staff             Psychosocial Re-Evaluation:  Psychosocial Re-Evaluation     [place] Name 06/18/21 1109 07/14/21 1120           Psychosocial Re-Evaluation   Current issues with Current Stress Concerns Current Stress Concerns      Comments Her husband has lung cancer and has diabetes on insulin- she will take care of him and check his BP, O2, and BG daily. She is currently sleeping well and reports no problems - bedtime 930-10pm and wakes up at 130am for the bathroom (no problems going back to sleep). She likes to garden and stay active to reduce stress - there is too much to be done (doesn't have much  time for relaxation). She will do deep breathing (in 5 seconds and out 7 seconds). She finds support and comfort in reading her bible in the morning and her faith. She feels like there is no one she can rely on for support. Doralee began to get emotional when talking about this- discussed possibly finding a counselor or therapist who she can find emotional support from. Clotee's lost her husband last week. She had prepared for this and reports doing well, but needing to get a lot done and taken care of. She is staying busy and walking a lot.      Expected Outcomes ST: reach out to pcp for counselor or therapist, continue relaxation techniques LT: find support system ST: reach out to pcp for counselor or therapist, continue relaxation techniques LT: find support system      Interventions Encouraged to attend Cardiac Rehabilitation for the exercise Encouraged to attend Cardiac Rehabilitation for the exercise      Continue Psychosocial Services  Follow up required by staff Follow up required by staff             Initial Review   Source of Stress Concerns Family Family               Psychosocial Discharge (Final Psychosocial Re-Evaluation):  Psychosocial Re-Evaluation -  07/14/21 1120       Psychosocial Re-Evaluation   Current issues with Current Stress Concerns    Comments Rey's lost her husband last week. She had prepared for this and reports doing well, but needing to get a lot done and taken care of. She is staying busy and walking a lot.    Expected Outcomes ST: reach out to pcp for counselor or therapist, continue relaxation techniques LT: find support system    Interventions Encouraged to attend Cardiac Rehabilitation for the exercise    Continue Psychosocial Services  Follow up required by staff      Initial Review   Source of Stress Concerns Family             Vocational Rehabilitation: Provide vocational rehab assistance to qualifying candidates.   Vocational Rehab Evaluation & Intervention:  Vocational Rehab - 05/02/21 1409       Initial Vocational Rehab Evaluation & Intervention   Assessment shows need for Vocational Rehabilitation No             Education: Education Goals: Education classes will be provided on a variety of topics geared toward better understanding of heart health and risk factor modification. Participant will state understanding/return demonstration of topics presented as noted by education test scores.  Learning Barriers/Preferences:  Learning Barriers/Preferences - 05/02/21 1409       Learning Barriers/Preferences   Learning Barriers None    Learning Preferences None             General Cardiac Education Topics:  AED/CPR: - Group verbal and written instruction with the use of models to demonstrate the basic use of the AED with the basic ABC's of resuscitation.   Anatomy and Cardiac Procedures: - Group verbal and visual presentation and models provide information about basic cardiac anatomy and function. Reviews the testing methods done to diagnose heart disease and the outcomes of the test results. Describes the treatment choices: Medical Management, Angioplasty, or Coronary Bypass Surgery  for treating various heart conditions including Myocardial Infarction, Angina, Valve Disease, and Cardiac Arrhythmias.  Written material given at graduation. Flowsheet Row Cardiac Rehab from 07/23/2021 in [facility] Cardiac and Pulmonary Rehab  Date 07/16/21  Educator [place]  Instruction Review Code 1- Verbalizes Understanding       Medication Safety: - Group verbal and visual instruction to review commonly prescribed medications for heart and lung disease. Reviews the medication, class of the drug, and side effects. Includes the steps to properly store meds and maintain the prescription regimen.  Written material given at graduation. Flowsheet Row Cardiac Rehab from 07/23/2021 in [facility] Cardiac and Pulmonary Rehab  Date 06/04/21  Educator [facility]  Instruction Review Code 1- Verbalizes Understanding       Intimacy: - Group verbal instruction through game format to discuss how heart and lung disease can affect sexual intimacy. Written material given at graduation.. Flowsheet Row Cardiac Rehab from 07/23/2021 in [facility] Cardiac and Pulmonary Rehab  Date 07/09/21  Educator [facility]  Instruction Review Code 1- Verbalizes Understanding       Know Your Numbers and Heart Failure: - Group verbal and visual instruction to discuss disease risk factors for cardiac and pulmonary disease and treatment options.  Reviews associated critical values for Overweight/Obesity, Hypertension, Cholesterol, and Diabetes.  Discusses basics of heart failure: signs/symptoms and treatments.  Introduces Heart Failure Zone chart for action plan for heart failure.  Written material given at graduation. Flowsheet Row Cardiac Rehab from 07/23/2021 in [facility] Cardiac and Pulmonary Rehab  Date 06/11/21  Educator [facility]  Instruction Review Code 1- Verbalizes Understanding       Infection Prevention: - Provides verbal and written material to individual with discussion of infection control including proper hand washing and proper equipment cleaning during  exercise session. Flowsheet Row Cardiac Rehab from 07/23/2021 in [facility] Cardiac and Pulmonary Rehab  Date 05/12/21  Educator [facility]  Instruction Review Code 1- Verbalizes Understanding       Falls Prevention: - Provides verbal and written material to individual with discussion of falls prevention and safety. Flowsheet Row Cardiac Rehab from 07/23/2021 in [facility] Cardiac and Pulmonary Rehab  Date 05/12/21  Educator [facility]  Instruction Review Code 1- Verbalizes Understanding       Other: -Provides group and verbal instruction on various topics (see comments)   Knowledge Questionnaire Score:  Knowledge Questionnaire Score - 07/23/21 1153       Knowledge Questionnaire Score   Post Score 26/26             Core Components/Risk Factors/Patient Goals at Admission:  Personal Goals and Risk Factors at Admission - 05/12/21 1039       Core Components/Risk Factors/Patient Goals on Admission    Weight Management Yes;Weight Maintenance    Intervention Weight Management: Develop a combined nutrition and exercise program designed to reach desired caloric intake, while maintaining appropriate intake of nutrient and fiber, sodium and fats, and appropriate energy expenditure required for the weight goal.;Weight Management: Provide education and appropriate resources to help participant work on and attain dietary goals.    Admit Weight 104 lb 3.2 oz (47.3 kg)    Goal Weight: Short Term 104 lb (47.2 kg)    Goal Weight: Long Term 104 lb (47.2 kg)    Expected Outcomes Short Term: Continue to assess and modify interventions until short term weight is achieved;Long Term: Adherence to nutrition and physical activity/exercise program aimed toward attainment of established weight goal;Weight Maintenance: Understanding of the daily nutrition guidelines, which includes 25-35% calories from fat, 7% or less cal from saturated fats, less than 252m cholesterol, less than 1.5gm of sodium, & 5 or more servings of fruits  and vegetables daily    Hypertension  Yes    Intervention Provide education on lifestyle modifcations including regular physical activity/exercise, weight management, moderate sodium restriction and increased consumption of fresh fruit, vegetables, and low fat dairy, alcohol moderation, and smoking cessation.;Monitor prescription use compliance.    Expected Outcomes Short Term: Continued assessment and intervention until BP is < 140/67m HG in hypertensive participants. < 130/843mHG in hypertensive participants with diabetes, heart failure or chronic kidney disease.;Long Term: Maintenance of blood pressure at goal levels.    Lipids Yes    Intervention Provide education and support for participant on nutrition & aerobic/resistive exercise along with prescribed medications to achieve LDL <7013mHDL >58m59m  Expected Outcomes Short Term: Participant states understanding of desired cholesterol values and is compliant with medications prescribed. Participant is following exercise prescription and nutrition guidelines.;Long Term: Cholesterol controlled with medications as prescribed, with individualized exercise RX and with personalized nutrition plan. Value goals: LDL < 70mg43mL > 40 mg.             Education:Diabetes - Individual verbal and written instruction to review signs/symptoms of diabetes, desired ranges of glucose level fasting, after meals and with exercise. Acknowledge that pre and post exercise glucose checks will be done for 3 sessions at entry of program.   Core Components/Risk Factors/Patient Goals Review:   Goals and Risk Factor Review     Row Name 06/18/21 1103             Core Components/Risk Factors/Patient Goals Review   Personal Goals Review Lipids;Hypertension       Review 122/62 today, at home it will be around 120s/60s (she will check when she thinks about it or if she feels like it is off). She is still taking all of her medication as directed. She has not had  recent bloodwork, but she stays active and eats heart healthy foods. She sees the cardiologist in September. She is having no issues with her medication and has been taking all of them as directed.       Expected Outcomes ST: check BP at home at least 2x/week LT: continue to manage risk factors.                Core Components/Risk Factors/Patient Goals at Discharge (Final Review):   Goals and Risk Factor Review - 06/18/21 1103       Core Components/Risk Factors/Patient Goals Review   Personal Goals Review Lipids;Hypertension    Review 122/62 today, at home it will be around 120s/60s (she will check when she thinks about it or if she feels like it is off). She is still taking all of her medication as directed. She has not had recent bloodwork, but she stays active and eats heart healthy foods. She sees the cardiologist in September. She is having no issues with her medication and has been taking all of them as directed.    Expected Outcomes ST: check BP at home at least 2x/week LT: continue to manage risk factors.             ITP Comments:  ITP Comments     Row Name 05/02/21 1414 05/12/21 1029 05/28/21 1119 06/25/21 0751 07/14/21 1115   ITP Comments Initial telephone orientation completed. Diagnosis can be found in [facility] EP orientation scheduled for Monday 6/27 at 8am. Completed 6MWT and gym orientation. Initial ITP created and sent for review to Dr. Mark Emily Filbertical Director. 30 Day review completed. Medical Director ITP review done, changes made as directed, and signed approval  by Medical Director. 30 Day review completed. Medical Director ITP review done, changes made as directed, and signed approval by Medical Director. Leveda's spouse passed away last week. Naylene reports doing well and staying busy, she has a lot of things to do and take care of.  We will defer 30-day goals until next review.    [place] Name 07/23/21 0714 07/28/21 1131         ITP Comments 30 Day review  completed. Medical Director ITP review done, changes made as directed, and signed approval by Medical Director. Yarisa graduated today from  rehab with 36 sessions completed.  Details of the patient's exercise prescription and what She needs to do in order to continue the prescription and progress were discussed with patient.  Patient was given a copy of prescription and goals.  Patient verbalized understanding.  Olayinka plans to continue to exercise by walking at home.               Comments: Discharge ITP

## 2022-02-04 ENCOUNTER — Other Ambulatory Visit: Payer: Self-pay | Admitting: Family Medicine

## 2022-02-04 DIAGNOSIS — Z1231 Encounter for screening mammogram for malignant neoplasm of breast: Secondary | ICD-10-CM

## 2022-03-10 ENCOUNTER — Ambulatory Visit
Admission: RE | Admit: 2022-03-10 | Discharge: 2022-03-10 | Disposition: A | Discharge status: Home / Self Care | Payer: Medicare PPO | Source: Ambulatory Visit | Attending: Family Medicine | Admitting: Family Medicine

## 2022-03-10 DIAGNOSIS — Z1231 Encounter for screening mammogram for malignant neoplasm of breast: Secondary | ICD-10-CM | POA: Diagnosis present

## 2022-07-16 IMAGING — MR MR HEAD W/O CM
11 series · 46 of 48 positions shown · non-contrast
Comparison: Noncontrast head CT 03/14/2021.

CLINICAL DATA: Neuro deficit, acute, stroke suspected.

EXAM:
MRI HEAD WITHOUT CONTRAST
TECHNIQUE: Multiplanar, multiecho pulse sequences of the brain and surrounding
structures were obtained without intravenous contrast.

[Series 5: ax dwi_tracew · axial · 3.0mm · 0.65mm/px · z∈[-121,+32]mm · 4 of 48 slices shown]
[im 1/48]
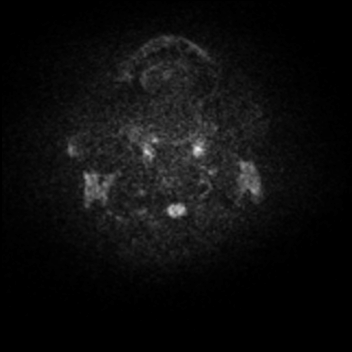
[im 16/48]
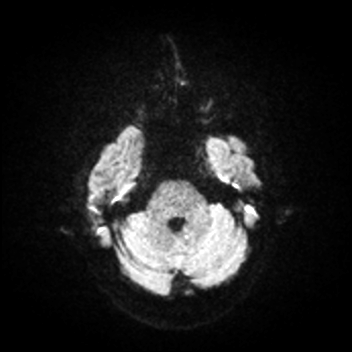
[im 32/48]
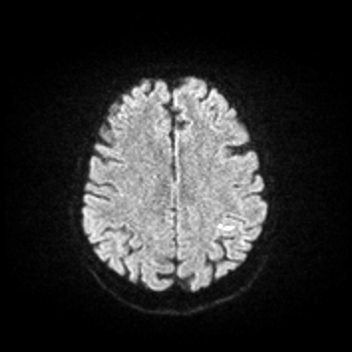
[im 48/48]
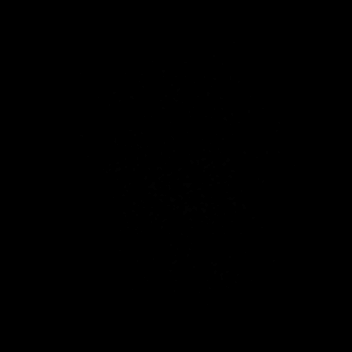

[Series 6: ax dwi_adc · axial · 3.0mm · 0.65mm/px · z∈[-121,+25]mm · 4 of 46 slices shown]
[im 1/46]
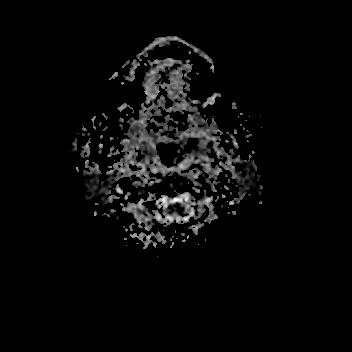
[im 16/46]
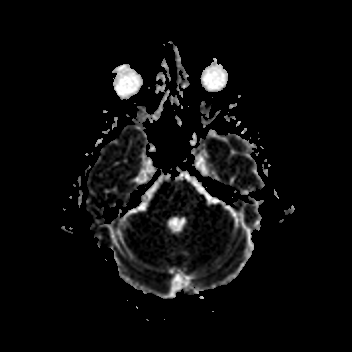
[im 31/46]
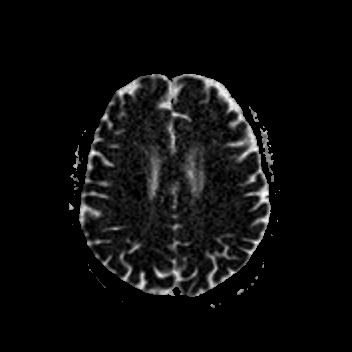
[im 46/46]
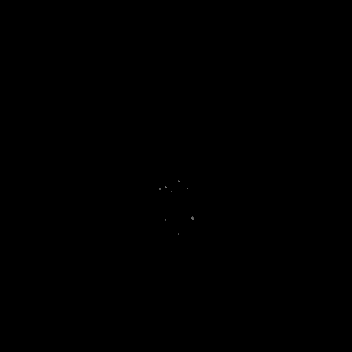

[Series 7: cor dwi_tracew · coronal · 5.0mm · 0.65mm/px · 3 of 38 slices shown]
[im 1/38]
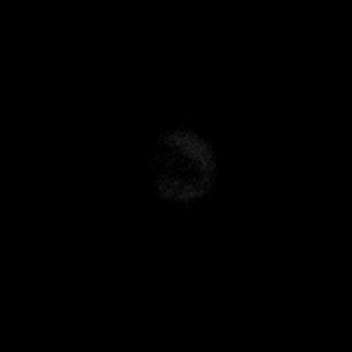
[im 19/38]
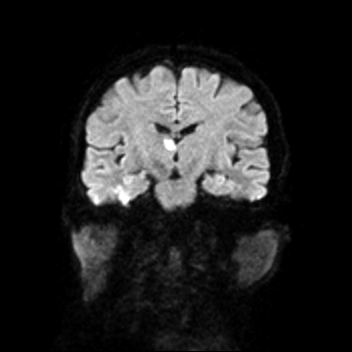
[im 38/38]
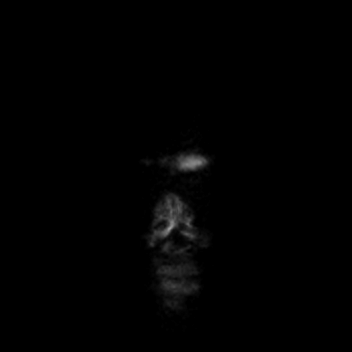

[Series 8: cor dwi_adc · coronal · 5.0mm · 0.65mm/px · 3 of 34 slices shown]
[im 1/34]
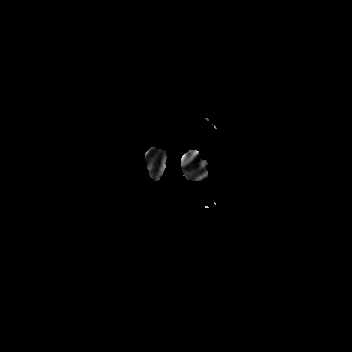
[im 17/34]
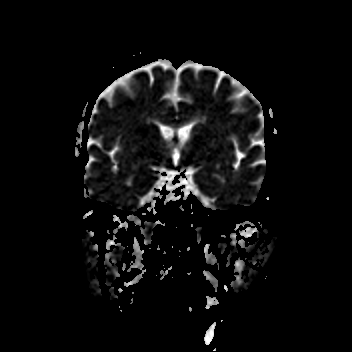
[im 34/34]
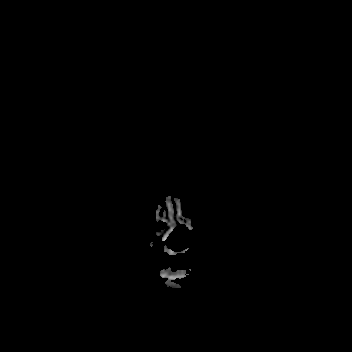

[Series 9: T1 · sagittal · 5.0mm · 0.62mm/px · 2 of 21 slices shown (1 of 2)]
[im 1/21]
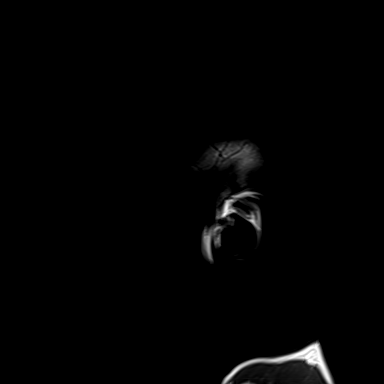
[im 21/21]
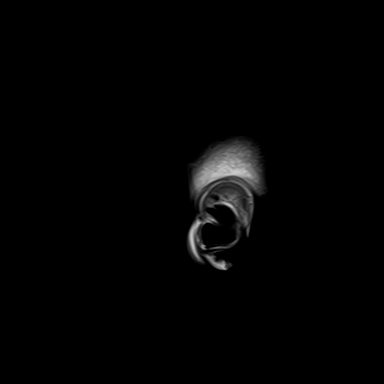

[Series 10: T2 · axial · 5.0mm · 0.53mm/px · z∈[-115,+27]mm · 2 of 25 slices shown (1 of 2)]
[im 1/25]
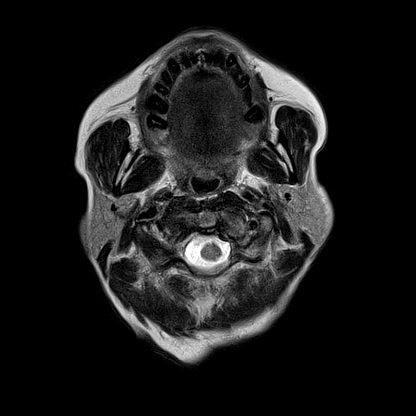
[im 25/25]
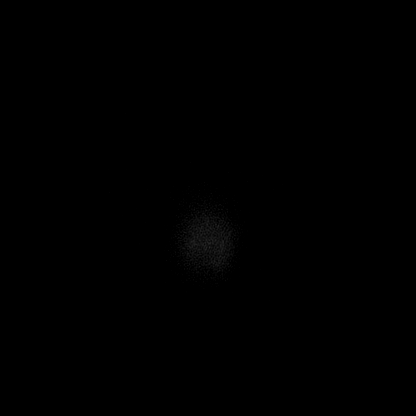

[Series 12: pha_images · axial · 3.0mm · 0.90mm/px · z∈[-129,+41]mm · 5 of 58 slices shown]
[im 1/58]
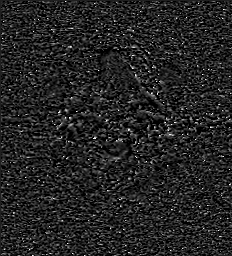
[im 15/58]
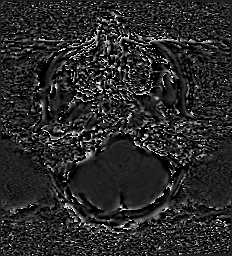
[im 29/58]
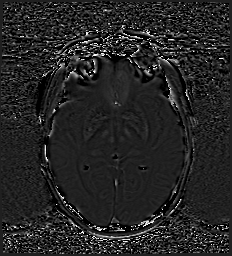
[im 43/58]
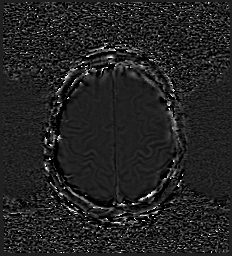
[im 58/58]
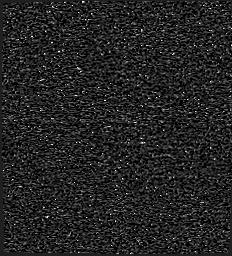

[Series 13: swi_images · axial · 3.0mm · 0.90mm/px · z∈[-129,+44]mm · 5 of 60 slices shown]
[im 1/60]
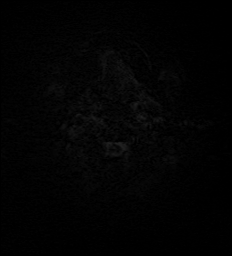
[im 15/60]
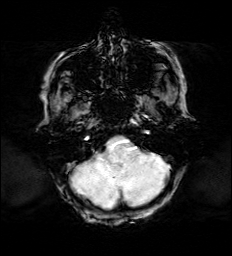
[im 30/60]
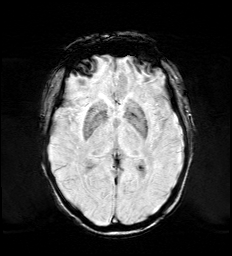
[im 45/60]
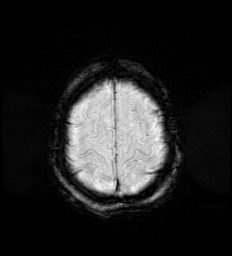
[im 60/60]
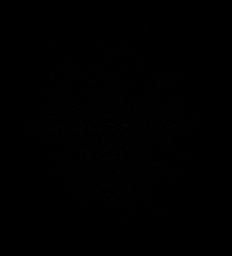

[Series 15: FLAIR · axial · 3.0mm · 0.53mm/px · z∈[-123,+36]mm · 4 of 55 slices shown]
[im 1/55]
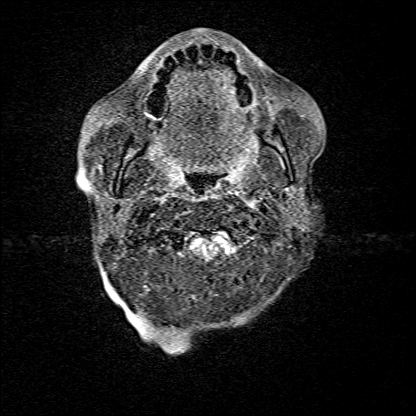
[im 19/55]
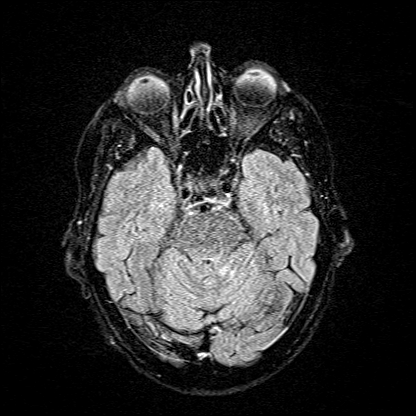
[im 37/55]
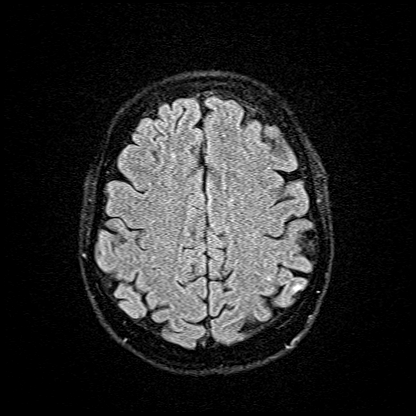
[im 55/55]
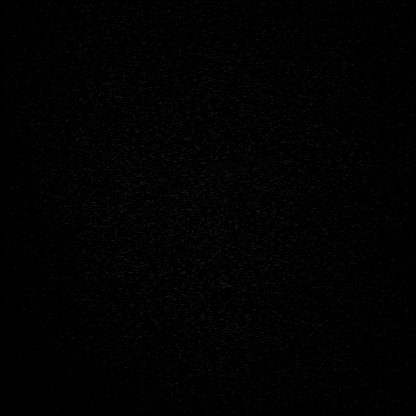

[Series 16: T1 · axial · 1.0mm · 0.98mm/px · z∈[-129,+42]mm · 12 of 175 slices shown (2 of 2)]
[im 1/175]
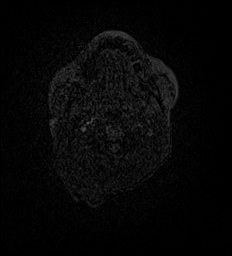
[im 14/175]
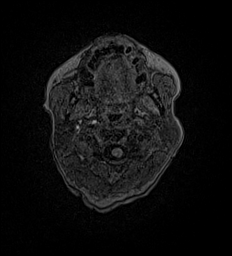
[im 27/175]
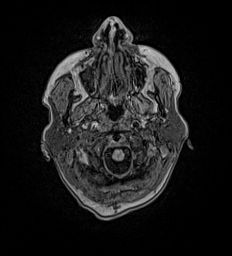
[im 41/175]
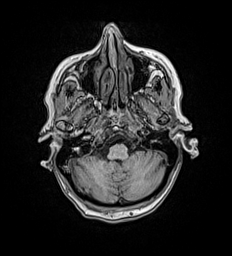
[im 54/175]
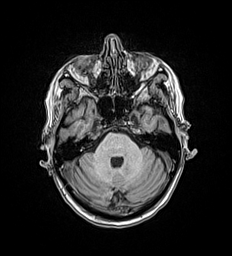
[im 67/175]
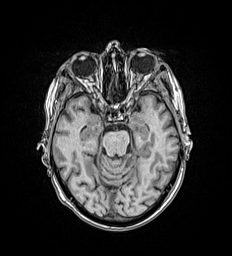
[im 81/175]
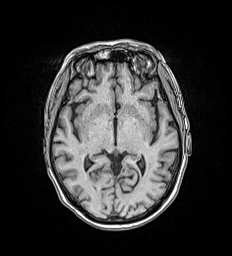
[im 94/175]
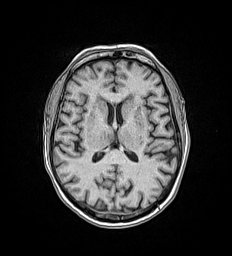
[im 108/175]
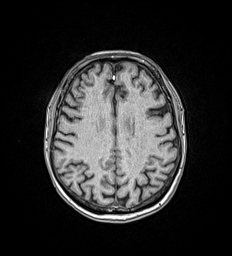
[im 121/175]
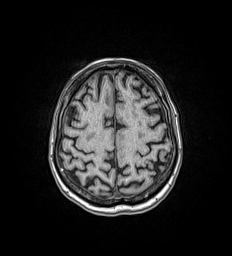
[im 148/175]
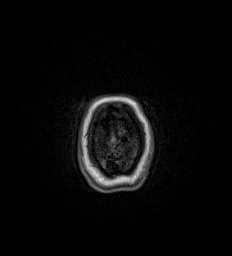
[im 175/175]
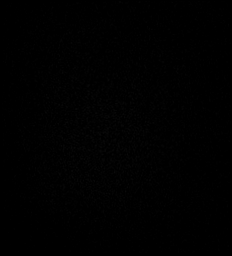

[Series 17: T2 · coronal · 5.0mm · 0.57mm/px · 2 of 29 slices shown (2 of 2)]
[im 1/29]
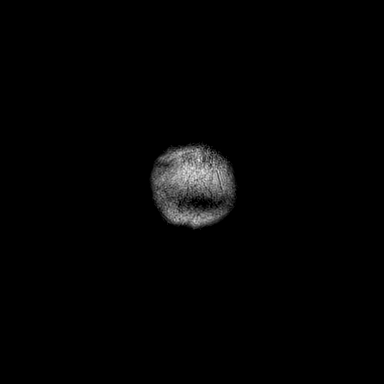
[im 29/29]
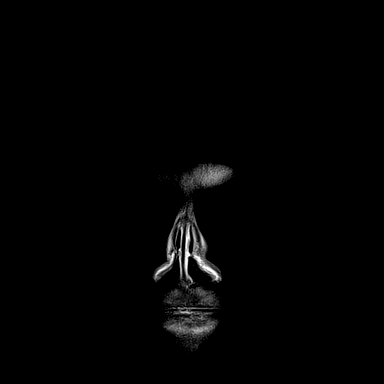

[46 of 48 positions shown; findings below may reference images not displayed]

FINDINGS: Brain:

Mild intermittent motion degradation.

Mild cerebral and cerebellar atrophy.

Small patchy acute cortical infarcts within the left parietal lobe
(with involvement of the left postcentral gyrus).

11 mm acute infarct within the medial right thalamus.

14 mm acute infarct within the medial aspect of the superior left
cerebellar hemisphere.

Mild multifocal T2/FLAIR hyperintensity within the cerebral white
matter and basal ganglia bilaterally, nonspecific but compatible
with chronic small vessel ischemic disease.

No evidence of intracranial mass.

No chronic intracranial blood products.

No extra-axial fluid collection.

No midline shift.

Vascular: Expected proximal arterial flow voids.

Skull and upper cervical spine: No focal marrow lesion. Incompletely
assessed upper cervical spondylosis.

Sinuses/Orbits: Visualized orbits show no acute finding. Mild
bilateral ethmoid, sphenoid and maxillary sinus mucosal thickening.
IMPRESSION: Patchy small acute cortical infarcts within the left parietal lobe
(with involvement of the postcentral gyrus). 11 mm acute infarct
within the medial right thalamus. 14 mm acute infarct within the
medial aspect of the superior left cerebellar hemisphere. Given the
involvement of multiple vascular territories, consider an embolic
process.

Background mild parenchymal atrophy and chronic small vessel
ischemic disease.

Mild paranasal sinus mucosal thickening.

## 2024-03-26 DIAGNOSIS — I2510 Atherosclerotic heart disease of native coronary artery without angina pectoris: Secondary | ICD-10-CM | POA: Insufficient documentation

## 2024-03-26 NOTE — Progress Notes (Unsigned)
 Cardiology Office Note  Date:  03/27/2024   ID:  Victoria [name], DOB [birth year], MRN [id number]  PCP:  Rory Collard, MD   Chief Complaint  Patient presents with   New Patient (Initial Visit)    Establish care for PAF/CAD/HTN. Prior patient of Dr. Parks Bollman. Denies chest pain or shortness of breath.    HPI:  Ms. Victoria [name] is a 78 year old woman with past medically of Non-STEMI in May 2022 Cardiac catheterization: occluded proximal RCA with left-to-right collaterals, calcified 60% stenosis mid LAD, 60% stenosis proximal OM1  Previously seen by [facility] cardiology Referred by Dr. Vila Grayer for coronary artery disease, paroxysmal atrial fibrillation, hyperlipidemia  Prior history requested, Ivette Marks notes reviewed Last seen by their clinic September 27, 2023 On that visit was taking Eliquis  and aspirin  81  Episode of syncope March 14, 2021 Cardiac catheterization May 2nd,  2022 occluded RCA with collaterals left-to-right medical management recommended Later that day embolic infarcts documented on MRI to parietal lobe, medial left thalamic and left cerebellar lobe.  2D echocardiogram 03/15/2021 revealed normal left ventricular function, with LVEF 50 to 55%.   Atrial fibrillation documented on telemetry Started on Eliquis  Walks 2 miles a day Works 3 days a week at [employer] work reviewed Total cholesterol 186 LDL 111, in 2024  EKG personally reviewed by myself on todays visit EKG Interpretation Date/Time:  Monday Mar 27 2024 09:44:03 EDT Ventricular Rate:  74 PR Interval:  134 QRS Duration:  82 QT Interval:  380 QTC Calculation: 421 R Axis:   1  Text Interpretation: Normal sinus rhythm Normal ECG When compared with ECG of 14-Mar-2021 17:06, No significant change was found Confirmed by Belva Boyden [phone]) on 03/27/2024 9:46:18 AM    PMH:   has a past medical history of Arthritis, Headache, Hyperlipidemia, and Hypertension.  PSH:    Past  Surgical History:  Procedure Laterality Date   COLONOSCOPY WITH PROPOFOL  N/A 09/26/2015   Procedure: COLONOSCOPY WITH PROPOFOL ;  Surgeon: Stephens Eis, MD;  Location: [facility] ENDOSCOPY;  Service: Gastroenterology;  Laterality: N/A;   LEFT HEART CATH AND CORONARY ANGIOGRAPHY N/A 03/17/2021   Procedure: LEFT HEART CATH AND CORONARY ANGIOGRAPHY;  Surgeon: Percival Brace, MD;  Location: [facility] INVASIVE CV LAB;  Service: Cardiovascular;  Laterality: N/A;   NO PAST SURGERIES     TUBAL LIGATION      Current Outpatient Medications  Medication Sig Dispense Refill   apixaban  (ELIQUIS ) 5 MG TABS tablet Take 1 tablet (5 mg total) by mouth 2 (two) times daily. 60 tablet 2   ascorbic acid (VITAMIN C) 1000 MG tablet Take 1,000 mg by mouth daily.     aspirin  EC 81 MG tablet Take 81 mg by mouth daily.     atorvastatin  (LIPITOR ) 80 MG tablet Take 1 tablet (80 mg total) by mouth daily. 30 tablet 1   Cholecalciferol 25 MCG (1000 UT) tablet Take 1 tablet by mouth daily.     cyanocobalamin (VITAMIN B12) 1000 MCG tablet Take 1,000 mcg by mouth daily.     ferrous sulfate 325 (65 FE) MG tablet Take 325 mg by mouth daily with breakfast.     GINKGO BILOBA EXTRACT PO Take by mouth daily.     losartan  (COZAAR ) 50 MG tablet Take 1 tablet (50 mg total) by mouth daily. 30 tablet 1   metoprolol  tartrate (LOPRESSOR ) 25 MG tablet Take 1 tablet (25 mg total) by mouth 2 (two) times daily. 60 tablet 1   Multiple Vitamin (  MULTI-VITAMIN) tablet Take 1 tablet by mouth daily.     No current facility-administered medications for this visit.     Allergies:   Patient has no known allergies.   Social History:  The patient  reports that she has never smoked. She has never used smokeless tobacco. She reports that she does not drink alcohol and does not use drugs.   Family History:   family history includes Cancer in her mother; Heart attack in her brother, brother, brother, sister, and sister; Stroke in her father.    Review of  Systems: Review of Systems  Constitutional: Negative.   HENT: Negative.    Respiratory: Negative.    Cardiovascular: Negative.   Gastrointestinal: Negative.   Musculoskeletal: Negative.   Neurological: Negative.   Psychiatric/Behavioral: Negative.    All other systems reviewed and are negative.   PHYSICAL EXAM: VS:  BP 130/60 (BP Location: Right Arm, Patient Position: Sitting, Cuff Size: Normal)   Pulse 74   Ht 4\' 10"  (1.473 m)   Wt 95 lb 8 oz (43.3 kg)   SpO2 96%   BMI 19.96 kg/m  , BMI Body mass index is 19.96 kg/m. GEN: Well nourished, well developed, in no acute distress HEENT: normal Neck: no JVD, carotid bruits, or masses Cardiac: RRR; no murmurs, rubs, or gallops,no edema  Respiratory:  clear to auscultation bilaterally, normal work of breathing GI: soft, nontender, nondistended, + BS MS: no deformity or atrophy Skin: warm and dry, no rash Neuro:  Strength and sensation are intact Psych: euthymic mood, full affect    Recent Labs: No results found for requested labs within last 365 days.    Lipid Panel Lab Results  Component Value Date   CHOL 145 03/19/2021   HDL 44 03/19/2021   LDLCALC 82 03/19/2021   TRIG 96 03/19/2021      Wt Readings from Last 3 Encounters:  03/27/24 95 lb 8 oz (43.3 kg)  07/16/21 108 lb 3.2 oz (49.1 kg)  05/12/21 104 lb 3.2 oz (47.3 kg)       ASSESSMENT AND PLAN:  Problem List Items Addressed This Visit       Cardiology Problems   CAD (coronary artery disease) - Primary   Relevant Medications   aspirin  EC 81 MG tablet   Other Relevant Orders   EKG 12-Lead (Completed)   Cerebrovascular accident (CVA) (HCC)   Relevant Medications   aspirin  EC 81 MG tablet   Other Relevant Orders   EKG 12-Lead (Completed)   NSTEMI (non-ST elevated myocardial infarction) (HCC)   Relevant Medications   aspirin  EC 81 MG tablet   Other Relevant Orders   EKG 12-Lead (Completed)   Hypertension   Relevant Medications   aspirin  EC 81 MG  tablet   Other Relevant Orders   EKG 12-Lead (Completed)   Hyperlipidemia   Relevant Medications   aspirin  EC 81 MG tablet   Coronary artery disease with stable angina Cardiac catheterization images from 2022 pulled up and reviewed showing occluded proximal RCA, collaterals from left to right Moderate disease in left circumflex and LAD also noted Stressed the importance of taking her Lipitor  80 daily Lipid panel ordered today, goal LDL less than 55  Essential hypertension Blood pressure is well controlled on today's visit. No changes made to the medications.  Paroxysmal atrial fibrillation Denies tachycardia or palpitations concerning for atrial fibrillation Recommend she stay on her metoprolol  and Eliquis  twice daily  History of stroke Currently on aspirin  with Eliquis  Plavix  previously held Denies  TIA or stroke symptoms No prior carotid ultrasound on record, will consider in follow-up   Signed, Juanda Noon, M.D., Ph.D. [facility] [place], [place] [phone]

## 2024-03-27 ENCOUNTER — Encounter: Payer: Self-pay | Admitting: Cardiovascular Disease

## 2024-03-27 ENCOUNTER — Ambulatory Visit: Payer: Medicare PPO | Attending: Cardiovascular Disease | Admitting: Cardiovascular Disease

## 2024-03-27 VITALS — BP 130/60 | HR 74 | Ht <= 58 in | Wt 95.5 lb

## 2024-03-27 DIAGNOSIS — I10 Essential (primary) hypertension: Secondary | ICD-10-CM | POA: Diagnosis not present

## 2024-03-27 DIAGNOSIS — E782 Mixed hyperlipidemia: Secondary | ICD-10-CM

## 2024-03-27 DIAGNOSIS — I639 Cerebral infarction, unspecified: Secondary | ICD-10-CM

## 2024-03-27 DIAGNOSIS — Z79899 Other long term (current) drug therapy: Secondary | ICD-10-CM

## 2024-03-27 DIAGNOSIS — I25118 Atherosclerotic heart disease of native coronary artery with other forms of angina pectoris: Secondary | ICD-10-CM

## 2024-03-27 DIAGNOSIS — I214 Non-ST elevation (NSTEMI) myocardial infarction: Secondary | ICD-10-CM

## 2024-03-27 NOTE — Patient Instructions (Addendum)
 Medication Instructions:  No changes  If you need a refill on your cardiac medications before your next appointment, please call your pharmacy.   Lab work: Lipids, CMP, CBC  Testing/Procedures: No new testing needed  Follow-Up: At [facility], you and your health needs are our priority.  As part of our continuing mission to provide you with exceptional heart care, we have created designated Provider Care Teams.  These Care Teams include your primary Cardiologist (physician) and Advanced Practice Providers (APPs -  Physician Assistants and Nurse Practitioners) who all work together to provide you with the care you need, when you need it.  You will need a follow up appointment in 12 months  Providers on your designated Care Team:   Laneta Pintos, NP Varney Gentleman, PA-C Cadence Gennaro Khat, [place]  COVID-19 Vaccine Information can be found at: [email or web address] For questions related to vaccine distribution or appointments, please email [email or web address] or call [phone].

## 2024-03-28 ENCOUNTER — Ambulatory Visit: Payer: Self-pay | Admitting: Cardiovascular Disease

## 2024-03-28 LAB — COMPREHENSIVE METABOLIC PANEL WITH GFR
ALT: 28 IU/L (ref 0–32)
AST: 27 IU/L (ref 0–40)
Albumin: 4.2 g/dL (ref 3.8–4.8)
Alkaline Phosphatase: 124 IU/L — ABNORMAL HIGH (ref 44–121)
BUN/Creatinine Ratio: 22 (ref 12–28)
BUN: 20 mg/dL (ref 8–27)
Bilirubin Total: 0.3 mg/dL (ref 0.0–1.2)
CO2: 19 mmol/L — ABNORMAL LOW (ref 20–29)
Calcium: 9.6 mg/dL (ref 8.7–10.3)
Chloride: 106 mmol/L (ref 96–106)
Creatinine, Ser: 0.9 mg/dL (ref 0.57–1.00)
Globulin, Total: 2.3 g/dL (ref 1.5–4.5)
Glucose: 88 mg/dL (ref 70–99)
Potassium: 4.6 mmol/L (ref 3.5–5.2)
Sodium: 142 mmol/L (ref 134–144)
Total Protein: 6.5 g/dL (ref 6.0–8.5)
eGFR: 65 mL/min/{1.73_m2} (ref >59)

## 2024-03-28 LAB — CBC
Hematocrit: 33 % — ABNORMAL LOW (ref 34.0–46.6)
Hemoglobin: 10.9 g/dL — ABNORMAL LOW (ref 11.1–15.9)
MCH: 31.5 pg (ref 26.6–33.0)
MCHC: 33 g/dL (ref 31.5–35.7)
MCV: 95 fL (ref 79–97)
Platelets: 301 10*3/uL (ref 150–450)
RBC: 3.46 x10E6/uL — ABNORMAL LOW (ref 3.77–5.28)
RDW: 11.5 % — ABNORMAL LOW (ref 11.7–15.4)
WBC: 7.4 10*3/uL (ref 3.4–10.8)

## 2024-03-28 LAB — LIPID PANEL
Chol/HDL Ratio: 2.4 ratio (ref 0.0–4.4)
Cholesterol, Total: 133 mg/dL (ref 100–199)
HDL: 55 mg/dL (ref >39)
LDL Chol Calc (NIH): 67 mg/dL (ref 0–99)
Triglycerides: 46 mg/dL (ref 0–149)
VLDL Cholesterol Cal: 11 mg/dL (ref 5–40)

## 2024-06-17 ENCOUNTER — Other Ambulatory Visit: Payer: Self-pay | Admitting: Cardiovascular Disease

## 2024-06-20 NOTE — Telephone Encounter (Signed)
 Is this medication permissible for refill Dr. Gollan didn't prescribe it but he did see her within this year.
